# Patient Record
Sex: Male | Born: 1935 | Race: Black or African American | Hispanic: No | Smoking: Never smoker
Health system: Southern US, Community
[De-identification: ages and names within clinical notes are randomized; demographics above are authoritative.]

## PROBLEM LIST (undated history)

## (undated) DIAGNOSIS — M159 Polyosteoarthritis, unspecified: Secondary | ICD-10-CM

## (undated) DIAGNOSIS — N189 Chronic kidney disease, unspecified: Secondary | ICD-10-CM

## (undated) DIAGNOSIS — F0390 Unspecified dementia without behavioral disturbance: Secondary | ICD-10-CM

## (undated) DIAGNOSIS — S22059D Unspecified fracture of T5-T6 vertebra, subsequent encounter for fracture with routine healing: Secondary | ICD-10-CM

## (undated) DIAGNOSIS — E785 Hyperlipidemia, unspecified: Secondary | ICD-10-CM

## (undated) DIAGNOSIS — M6281 Muscle weakness (generalized): Secondary | ICD-10-CM

## (undated) DIAGNOSIS — M545 Low back pain, unspecified: Secondary | ICD-10-CM

## (undated) DIAGNOSIS — E119 Type 2 diabetes mellitus without complications: Secondary | ICD-10-CM

## (undated) DIAGNOSIS — N289 Disorder of kidney and ureter, unspecified: Secondary | ICD-10-CM

## (undated) DIAGNOSIS — I509 Heart failure, unspecified: Secondary | ICD-10-CM

## (undated) DIAGNOSIS — I10 Essential (primary) hypertension: Secondary | ICD-10-CM

## (undated) DIAGNOSIS — M1990 Unspecified osteoarthritis, unspecified site: Secondary | ICD-10-CM

## (undated) DIAGNOSIS — S22058D Other fracture of T5-T6 vertebra, subsequent encounter for fracture with routine healing: Secondary | ICD-10-CM

## (undated) HISTORY — PX: CHOLECYSTECTOMY: SHX55

## (undated) HISTORY — PX: BACK SURGERY: SHX140

## (undated) HISTORY — PX: HERNIA REPAIR: SHX51

## (undated) HISTORY — DX: Heart failure, unspecified: I50.9

## (undated) HISTORY — PX: KNEE SURGERY: SHX244

---

## 1998-07-10 ENCOUNTER — Ambulatory Visit (HOSPITAL_COMMUNITY): Admission: RE | Admit: 1998-07-10 | Discharge: 1998-07-10 | Payer: Self-pay | Admitting: Cardiology

## 1998-07-11 ENCOUNTER — Encounter: Payer: Self-pay | Admitting: Cardiology

## 1998-08-05 ENCOUNTER — Encounter: Payer: Self-pay | Admitting: Internal Medicine

## 1998-08-05 ENCOUNTER — Ambulatory Visit (HOSPITAL_COMMUNITY): Admission: RE | Admit: 1998-08-05 | Discharge: 1998-08-05 | Payer: Self-pay | Admitting: Internal Medicine

## 1998-11-17 ENCOUNTER — Encounter: Admission: RE | Admit: 1998-11-17 | Discharge: 1998-12-10 | Payer: Self-pay | Admitting: Orthopedic Surgery

## 1999-02-04 ENCOUNTER — Ambulatory Visit (HOSPITAL_BASED_OUTPATIENT_CLINIC_OR_DEPARTMENT_OTHER): Admission: RE | Admit: 1999-02-04 | Discharge: 1999-02-04 | Payer: Self-pay | Admitting: Orthopedic Surgery

## 1999-06-12 ENCOUNTER — Ambulatory Visit (HOSPITAL_BASED_OUTPATIENT_CLINIC_OR_DEPARTMENT_OTHER): Admission: RE | Admit: 1999-06-12 | Discharge: 1999-06-12 | Payer: Self-pay | Admitting: Urology

## 1999-08-27 ENCOUNTER — Encounter: Admission: RE | Admit: 1999-08-27 | Discharge: 1999-08-27 | Payer: Self-pay | Admitting: Cardiology

## 1999-08-27 ENCOUNTER — Encounter: Payer: Self-pay | Admitting: Cardiology

## 2000-10-31 ENCOUNTER — Encounter: Payer: Self-pay | Admitting: Cardiology

## 2000-10-31 ENCOUNTER — Ambulatory Visit (HOSPITAL_COMMUNITY): Admission: RE | Admit: 2000-10-31 | Discharge: 2000-10-31 | Payer: Self-pay | Admitting: Cardiology

## 2001-09-06 ENCOUNTER — Encounter: Payer: Self-pay | Admitting: Emergency Medicine

## 2001-09-06 ENCOUNTER — Emergency Department (HOSPITAL_COMMUNITY): Admission: EM | Admit: 2001-09-06 | Discharge: 2001-09-06 | Payer: Self-pay | Admitting: Emergency Medicine

## 2002-04-20 ENCOUNTER — Encounter: Payer: Self-pay | Admitting: Orthopedic Surgery

## 2002-04-20 ENCOUNTER — Encounter: Admission: RE | Admit: 2002-04-20 | Discharge: 2002-04-20 | Payer: Self-pay | Admitting: Orthopedic Surgery

## 2002-04-23 ENCOUNTER — Ambulatory Visit (HOSPITAL_BASED_OUTPATIENT_CLINIC_OR_DEPARTMENT_OTHER): Admission: RE | Admit: 2002-04-23 | Discharge: 2002-04-23 | Payer: Self-pay | Admitting: Orthopedic Surgery

## 2002-08-22 ENCOUNTER — Encounter (INDEPENDENT_AMBULATORY_CARE_PROVIDER_SITE_OTHER): Payer: Self-pay | Admitting: Specialist

## 2002-08-22 ENCOUNTER — Ambulatory Visit (HOSPITAL_COMMUNITY): Admission: RE | Admit: 2002-08-22 | Discharge: 2002-08-22 | Payer: Self-pay | Admitting: Gastroenterology

## 2002-09-22 ENCOUNTER — Emergency Department (HOSPITAL_COMMUNITY): Admission: EM | Admit: 2002-09-22 | Discharge: 2002-09-22 | Payer: Self-pay | Admitting: Emergency Medicine

## 2002-09-22 ENCOUNTER — Encounter: Payer: Self-pay | Admitting: Emergency Medicine

## 2003-03-13 ENCOUNTER — Encounter: Payer: Self-pay | Admitting: Cardiology

## 2003-03-13 ENCOUNTER — Encounter: Admission: RE | Admit: 2003-03-13 | Discharge: 2003-03-13 | Payer: Self-pay | Admitting: Cardiology

## 2003-04-15 ENCOUNTER — Encounter: Payer: Self-pay | Admitting: Cardiology

## 2003-04-15 ENCOUNTER — Ambulatory Visit (HOSPITAL_COMMUNITY): Admission: RE | Admit: 2003-04-15 | Discharge: 2003-04-15 | Payer: Self-pay | Admitting: Cardiology

## 2003-08-05 ENCOUNTER — Encounter (HOSPITAL_COMMUNITY): Admission: RE | Admit: 2003-08-05 | Discharge: 2003-11-03 | Payer: Self-pay | Admitting: Cardiology

## 2004-01-01 ENCOUNTER — Encounter: Admission: RE | Admit: 2004-01-01 | Discharge: 2004-01-01 | Payer: Self-pay | Admitting: Cardiology

## 2004-01-06 ENCOUNTER — Ambulatory Visit (HOSPITAL_BASED_OUTPATIENT_CLINIC_OR_DEPARTMENT_OTHER): Admission: RE | Admit: 2004-01-06 | Discharge: 2004-01-06 | Payer: Self-pay | Admitting: Cardiology

## 2004-01-11 ENCOUNTER — Ambulatory Visit (HOSPITAL_COMMUNITY): Admission: RE | Admit: 2004-01-11 | Discharge: 2004-01-11 | Payer: Self-pay

## 2004-01-28 ENCOUNTER — Emergency Department (HOSPITAL_COMMUNITY): Admission: EM | Admit: 2004-01-28 | Discharge: 2004-01-29 | Payer: Self-pay | Admitting: *Deleted

## 2004-02-09 ENCOUNTER — Ambulatory Visit (HOSPITAL_BASED_OUTPATIENT_CLINIC_OR_DEPARTMENT_OTHER): Admission: RE | Admit: 2004-02-09 | Discharge: 2004-02-09 | Payer: Self-pay | Admitting: Cardiology

## 2004-04-13 ENCOUNTER — Encounter: Admission: RE | Admit: 2004-04-13 | Discharge: 2004-06-12 | Payer: Self-pay | Admitting: Neurosurgery

## 2004-11-24 ENCOUNTER — Encounter: Admission: RE | Admit: 2004-11-24 | Discharge: 2004-11-24 | Payer: Self-pay | Admitting: Cardiology

## 2004-11-30 ENCOUNTER — Encounter: Admission: RE | Admit: 2004-11-30 | Discharge: 2004-11-30 | Payer: Self-pay | Admitting: Cardiology

## 2004-12-04 ENCOUNTER — Encounter: Admission: RE | Admit: 2004-12-04 | Discharge: 2004-12-04 | Payer: Self-pay | Admitting: Cardiology

## 2005-03-16 ENCOUNTER — Emergency Department (HOSPITAL_COMMUNITY): Admission: EM | Admit: 2005-03-16 | Discharge: 2005-03-16 | Payer: Self-pay | Admitting: Emergency Medicine

## 2005-03-19 ENCOUNTER — Encounter: Admission: RE | Admit: 2005-03-19 | Discharge: 2005-03-19 | Payer: Self-pay | Admitting: Cardiology

## 2006-03-04 ENCOUNTER — Encounter: Admission: RE | Admit: 2006-03-04 | Discharge: 2006-03-04 | Payer: Self-pay | Admitting: Cardiology

## 2007-07-07 ENCOUNTER — Encounter (HOSPITAL_COMMUNITY): Admission: RE | Admit: 2007-07-07 | Discharge: 2007-08-10 | Payer: Self-pay | Admitting: Cardiology

## 2007-08-10 ENCOUNTER — Emergency Department (HOSPITAL_COMMUNITY): Admission: EM | Admit: 2007-08-10 | Discharge: 2007-08-10 | Payer: Self-pay | Admitting: Emergency Medicine

## 2007-08-12 ENCOUNTER — Emergency Department (HOSPITAL_COMMUNITY): Admission: EM | Admit: 2007-08-12 | Discharge: 2007-08-12 | Payer: Self-pay | Admitting: Family Medicine

## 2008-04-16 ENCOUNTER — Encounter: Admission: RE | Admit: 2008-04-16 | Discharge: 2008-04-16 | Payer: Self-pay | Admitting: Cardiology

## 2009-03-03 ENCOUNTER — Encounter (HOSPITAL_COMMUNITY): Admission: RE | Admit: 2009-03-03 | Discharge: 2009-05-05 | Payer: Self-pay | Admitting: Cardiology

## 2009-03-04 ENCOUNTER — Ambulatory Visit (HOSPITAL_COMMUNITY): Admission: RE | Admit: 2009-03-04 | Discharge: 2009-03-04 | Payer: Self-pay | Admitting: Cardiology

## 2010-12-18 NOTE — Op Note (Signed)
Daniel [name], Daniel [name] NO.:  [id number]   MEDICAL RECORD NO.:  [id number]                   PATIENT TYPE:  AMB   LOCATION:  DSC                                  FACILITY:  [facility]   PHYSICIAN:  Feliberto Gottron. Turner Daniels, M.D.                DATE OF BIRTH:  10/03/1927   DATE OF PROCEDURE:  04/23/2002  DATE OF DISCHARGE:                                 OPERATIVE REPORT   PREOPERATIVE DIAGNOSES:  Right shoulder impingement syndrome, with  subacromial and subclavicular spurring, probable rotator cuff tear.   POSTOPERATIVE DIAGNOSES:  Right shoulder impingement syndrome, with  subacromial and subclavicular spurring, with a definite rotator cuff tear,  supraspinatus, full thickness.   OPERATION PERFORMED:  Right shoulder arthroscopic anterior inferior  acromioplasty, co planing of the distal clavicle.  Debridement of full  thickness rotator cuff tear and partial thickness tear of the biceps anchor  and the glenoid labrum.   SURGEON:  Feliberto Gottron. Turner Daniels, M.D.   ASSISTANTLaural Benes. Jannet Mantis.   ANESTHESIA:  Interscalene block with general endotracheal.   ESTIMATED BLOOD LOSS:  Minimal.   FLUIDS REPLACED:  1L of crystalloid.   DRAINS:  None.   TOURNIQUET TIME:  None.   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  Plain radiographs do  show subclavicular and subacromial sharp type 3 spurs.  He has good rotator  cuff power but in his age group there is at least a 40% chance he has a full  thickness cuff tear.  In any event, having failed conservative measures, he  was taken for arthroscopic evaluation and treatment of his right shoulder.   DESCRIPTION OF PROCEDURE:  The patient was identified by arm band and taken  to the operating  room at [facility] where appropriate  anesthetic monitors are attached and interscalene block anesthesia was  induced to the right upper extremity followed by general endotracheal  anesthesia.  He was then placed in the beach chair position.  The right  upper extremity was prepped and draped in the usual sterile fashion from the  wrist to the hemithorax.  With traction applied to the elbow, standard  portals were then made 1.5 cm anterior to the [facility] joint, lateral to the  junction of the posterior and middle thirds of the acromion and posterior to  the posterolateral corner of the acromion process.  The inflow was placed  anteriorly, the arthroscope laterally and a 4.2 great white sucker shaver  posteriorly, allowing subacromial bursectomy and outlining of the  subclavicular and subacromial spurs.  We also identified what was initially  thought to be partial thickness tearing of the rotator cuff  but after  removal of the bursa, there was a full thickness tear of the supraspinatus.  It came very close to the rim of the glenoid.  The biceps tendon was also  noted to have some fraying.  In any event, having cleaned the spurs, a 4.5  hooded Vortex spur was then used to remove the distal clavicle spur as well  as the subacromial spurs creating a type 1 subacromial arch.  We further  debrided the rotator cuff tear back to what appeared to be stable margins,  involved primarily the supraspinatus tendon with a suspension bridge type  defect in the supraspinatus from the biceps back towards the infraspinatus.  We also smoothed off the edges of the greater tuberosity to allow for better  motion in the subacromial arch.  At this time the arthroscope was  repositioned into the glenohumeral joint from the posterior portal and we  identified tearing of the labrum which was also debrided back to stable  margins as well as the biceps anchor.  At this point the shoulder was then  washed  out with normal saline solution.  The articular cartilage was noted  to be in good condition.  A dressing of Xeroform, 4 x 4 dressing sponges and  paper tape and a sling applied.  Patient awakened and taken to the recovery  room without difficulty.                                                 Feliberto Gottron. Turner Daniels, M.D.    Ovid Curd  D:  04/23/2002  T:  04/24/2002  Job:  [id number]

## 2010-12-18 NOTE — Procedures (Signed)
NAME:  Daniel [name], Daniel [name] NO.:  [id number]   MEDICAL RECORD NO.:  [id number]          PATIENT TYPE:  OUT   LOCATION:  SLEEP CENTER                 FACILITY:  [facility]   PHYSICIAN:  Clinton D. Maple Hudson, M.D. DATE OF BIRTH:  [birth year]   DATE OF ADMISSION:  02/09/2004  DATE OF DISCHARGE:  02/09/2004                              NOCTURNAL POLYSOMNOGRAM   REFERRING PHYSICIAN:  Dr. Kevin Fenton Spruill   INDICATION FOR STUDY/HISTORY:  Hypersomnia with obstructive sleep apnea.  CPAP titration ordered.  Baseline NPSG on January 06, 2004 demonstrated severe  obstructive apnea with a RDI of 92 obstructive events per hour desaturating  to 71%.  Home medications include metformin, lisinopril, glipizide, Actos,  Toprol, Norvasc, Micardis.  No sleep medication was reported for this study.  Epworth sleepiness score 21/24.  Neck size 22 inches, BMI 39.4, weight 267  pounds.   SLEEP ARCHITECTURE:  Sleep of 458 minutes were recorded with a sleep  efficiency of 90% of which 9% was stage 1, 41% stage 2.  Stages 3 and 4 were  absent which may be consistent with his age.  Fifty percent of the study was  REM indicating REM rebound after initiation of CPAP.  Sleep latency was 3.5  minutes, confirming hypersomnia before the CPAP titration.  REM latency was  76 minutes.   RESPIRATORY DATA:  CPAP was titrated to 17 CWP with RDI of 5 obstructive  events per hour at that setting.  A large full face Ultramirage mask was  utilized with heated humidity.   OXYGEN DATA:  Oxygen saturation was held 92-97% with good control of  snoring.   CARDIAC DATA:  Normal sinus rhythm ranging 60-68 beats per minute.   IMPRESSION/RECOMMENDATION:  Severe obstructive sleep apnea/hypopnea syndrome  with good control at CPAP 17 CWP.                                   ______________________________                                Rennis Chris. Maple Hudson, M.D.                                Diplomate, American Board of Sleep  Medicine    CDY/MEDQ  D:  02/16/2004 14:19:32  T:  02/16/2004 23:04:14  Job:  [id number]

## 2010-12-18 NOTE — Op Note (Signed)
   NAME:  Daniel [name], Daniel [name] NO.:  [id number]   MEDICAL RECORD NO.:  [id number]                   PATIENT TYPE:  AMB   LOCATION:  ENDO                                 FACILITY:  [facility]   PHYSICIAN:  Anselmo Rod, M.D.               DATE OF BIRTH:  [birth year]   DATE OF PROCEDURE:  08/22/2002  DATE OF DISCHARGE:                                 OPERATIVE REPORT   PROCEDURE PERFORMED:  Esophagogastroduodenoscopy.   ENDOSCOPIST:  Anselmo Rod, M.D.   INSTRUMENT USED:  Olympus video panendoscope.   INDICATIONS FOR PROCEDURE:  A 77 year old African-American male with a  history of anemia and rectal bleeding.  Rule out peptic ulcer disease,  esophagitis, gastritis, etc.   PREPROCEDURE PREPARATION:  Informed consent was procured from the patient.  The patient had fasted for eight hours prior to the procedure.   PREPROCEDURE PHYSICAL:  VITAL SIGNS:  The patient has stable vital signs.  NECK:  Supple.  CHEST:  Clear to auscultation, S1, S2.  ABDOMEN:  Soft with normal bowel sounds.   DESCRIPTION OF PROCEDURE:  The patient was placed in the left lateral  decubitus position and sedated with 40 mg of Demerol and 3 mg of Versed  intravenously.  Once the patient was adequately sedated and maintained on  low-flow oxygen and continuous cardiac monitoring, the Olympus video  panendoscope was advanced through the mouthpiece, over the tongue, into the  esophagus under direct vision. The entire esophagus appeared normal with no  evidence of ring, stricture, mass, esophagitis, or Barrett's mucosa.  The  scope was then advanced into the stomach.  A small hiatal hernia was seen on  high retroflexion.  There was moderate antral gastritis noted with no  evidence of masses or polyps.  The proximal small bowel appeared normal.   IMPRESSION:  1. Small hiatal hernia.  2. Mild-to-moderate antral gastritis, no ulcers or erosions seen.  3. Normal-appearing proximal small  bowel.    RECOMMENDATIONS:  1. Follow anti-reflux measures.  2. Avoid nonsteroidal's including aspirin until further recommendations.  3. Proceed with a colonoscopy at this time.                                                Anselmo Rod, M.D.    JNM/MEDQ  D:  08/22/2002  T:  08/22/2002  Job:  [id number]   cc:   Olene Craven, M.D.  [address]  [address]  [place]  [place] [address]  Fax: [phone]

## 2010-12-18 NOTE — Op Note (Signed)
NAME:  Daniel [name], Daniel [name] NO.:  [id number]   MEDICAL RECORD NO.:  [id number]                   PATIENT TYPE:  AMB   LOCATION:  ENDO                                 FACILITY:  [facility]   PHYSICIAN:  Anselmo Rod, M.D.               DATE OF BIRTH:  11-04-1927   DATE OF PROCEDURE:  08/22/2002  DATE OF DISCHARGE:                                 OPERATIVE REPORT   PROCEDURE PERFORMED:  Colonoscopy with snare polypectomy (cold snare) times  two.   ENDOSCOPIST:  Charna Elizabeth, M.D.   INSTRUMENT USED:  Olympus video colonoscope.   INDICATIONS FOR PROCEDURE:  Rectal bleeding and anemia in a 77 year old  African-American male.  Rule out colonic polyps, masses, etc.   PREPROCEDURE PREPARATION:  Informed consent was procured from the patient.  The patient was fasted for eight hours prior to the procedure and prepped  with a bottle of magnesium citrate and a gallon of NuLytely the night prior  to the procedure.   PREPROCEDURE PHYSICAL:  The patient had stable vital signs.  Neck supple.  Chest clear to auscultation.  Abdomen soft with normal bowel sounds.   DESCRIPTION OF PROCEDURE:  The patient was placed in left lateral decubitus  position.  The patient received some sedation for the  esophagogastroduodenoscopy.  Therefore no additional sedation was given for  the colonoscopy.  Once the patient was adequately sedated and maintained on  low flow oxygen and continuous cardiac monitoring, the Olympus video  colonoscope was advanced from the rectum to the cecum with difficulty.  There was a large amount of residual stool in the right colon.  The  patient's position was changed from the left lateral to the supine position  to move the stool.  The appendiceal orifice and the ileocecal valve were  visualized and photographed.  Small lesions could have been missed.  A small  sessile polyp was snared from 20 cm, another small sessile polyp snared from  5 cm.  These  were removed by cold snare.  Moderate sized bleeding internal  hemorrhoids were noticed.  There were small external hemorrhoids noted on  anal inspection.  There was evidence of early left-sided diverticulosis in  the left colon.  No other masses or polyps were seen.   IMPRESSION:  1. Moderate-sized external hemorrhoid not bleeding at time of examination.  2. Moderate-sized bleeding internal hemorrhoids.  3. Two small sessile polyps snared from 5 and 20 cm (see description above).  4. Early left-sided diverticulosis.  5. Large amount of residual stool in the colon.  Small lesions could have     been missed.   RECOMMENDATIONS:  1. Await pathology results.  2. Avoid nonsteroidals including aspirin for now.  3. Outpatient follow-up in the next two weeks for further recommendations.    IMPRESSION:   RECOMMENDATIONS:  Anselmo Rod, M.D.    JNM/MEDQ  D:  08/22/2002  T:  08/22/2002  Job:  [id number]   cc:   Olene Craven, M.D.  [address]  [address]  [place]  [place] [address]  Fax: [phone]

## 2011-06-30 ENCOUNTER — Emergency Department: Payer: Self-pay | Admitting: *Deleted

## 2011-07-15 DIAGNOSIS — E785 Hyperlipidemia, unspecified: Secondary | ICD-10-CM | POA: Diagnosis not present

## 2011-07-15 DIAGNOSIS — I10 Essential (primary) hypertension: Secondary | ICD-10-CM | POA: Diagnosis not present

## 2011-07-15 DIAGNOSIS — E1129 Type 2 diabetes mellitus with other diabetic kidney complication: Secondary | ICD-10-CM | POA: Diagnosis not present

## 2011-07-15 DIAGNOSIS — R262 Difficulty in walking, not elsewhere classified: Secondary | ICD-10-CM | POA: Diagnosis not present

## 2011-07-15 DIAGNOSIS — E1165 Type 2 diabetes mellitus with hyperglycemia: Secondary | ICD-10-CM | POA: Diagnosis not present

## 2011-07-20 DIAGNOSIS — R262 Difficulty in walking, not elsewhere classified: Secondary | ICD-10-CM | POA: Diagnosis not present

## 2011-07-20 DIAGNOSIS — E1129 Type 2 diabetes mellitus with other diabetic kidney complication: Secondary | ICD-10-CM | POA: Diagnosis not present

## 2011-07-20 DIAGNOSIS — E785 Hyperlipidemia, unspecified: Secondary | ICD-10-CM | POA: Diagnosis not present

## 2011-07-20 DIAGNOSIS — I10 Essential (primary) hypertension: Secondary | ICD-10-CM | POA: Diagnosis not present

## 2011-07-21 DIAGNOSIS — E785 Hyperlipidemia, unspecified: Secondary | ICD-10-CM | POA: Diagnosis not present

## 2011-07-21 DIAGNOSIS — R262 Difficulty in walking, not elsewhere classified: Secondary | ICD-10-CM | POA: Diagnosis not present

## 2011-07-21 DIAGNOSIS — I10 Essential (primary) hypertension: Secondary | ICD-10-CM | POA: Diagnosis not present

## 2011-07-21 DIAGNOSIS — E1129 Type 2 diabetes mellitus with other diabetic kidney complication: Secondary | ICD-10-CM | POA: Diagnosis not present

## 2011-07-22 DIAGNOSIS — E785 Hyperlipidemia, unspecified: Secondary | ICD-10-CM | POA: Diagnosis not present

## 2011-07-22 DIAGNOSIS — E1165 Type 2 diabetes mellitus with hyperglycemia: Secondary | ICD-10-CM | POA: Diagnosis not present

## 2011-07-22 DIAGNOSIS — R262 Difficulty in walking, not elsewhere classified: Secondary | ICD-10-CM | POA: Diagnosis not present

## 2011-07-22 DIAGNOSIS — I10 Essential (primary) hypertension: Secondary | ICD-10-CM | POA: Diagnosis not present

## 2011-07-25 DIAGNOSIS — E1129 Type 2 diabetes mellitus with other diabetic kidney complication: Secondary | ICD-10-CM | POA: Diagnosis not present

## 2011-07-25 DIAGNOSIS — R262 Difficulty in walking, not elsewhere classified: Secondary | ICD-10-CM | POA: Diagnosis not present

## 2011-07-25 DIAGNOSIS — I10 Essential (primary) hypertension: Secondary | ICD-10-CM | POA: Diagnosis not present

## 2011-07-25 DIAGNOSIS — E785 Hyperlipidemia, unspecified: Secondary | ICD-10-CM | POA: Diagnosis not present

## 2011-07-26 DIAGNOSIS — R262 Difficulty in walking, not elsewhere classified: Secondary | ICD-10-CM | POA: Diagnosis not present

## 2011-07-26 DIAGNOSIS — E785 Hyperlipidemia, unspecified: Secondary | ICD-10-CM | POA: Diagnosis not present

## 2011-07-26 DIAGNOSIS — I10 Essential (primary) hypertension: Secondary | ICD-10-CM | POA: Diagnosis not present

## 2011-07-26 DIAGNOSIS — E1165 Type 2 diabetes mellitus with hyperglycemia: Secondary | ICD-10-CM | POA: Diagnosis not present

## 2011-07-28 DIAGNOSIS — E1165 Type 2 diabetes mellitus with hyperglycemia: Secondary | ICD-10-CM | POA: Diagnosis not present

## 2011-07-28 DIAGNOSIS — E785 Hyperlipidemia, unspecified: Secondary | ICD-10-CM | POA: Diagnosis not present

## 2011-07-28 DIAGNOSIS — R262 Difficulty in walking, not elsewhere classified: Secondary | ICD-10-CM | POA: Diagnosis not present

## 2011-07-28 DIAGNOSIS — I10 Essential (primary) hypertension: Secondary | ICD-10-CM | POA: Diagnosis not present

## 2011-08-04 DIAGNOSIS — I10 Essential (primary) hypertension: Secondary | ICD-10-CM | POA: Diagnosis not present

## 2011-08-04 DIAGNOSIS — E1129 Type 2 diabetes mellitus with other diabetic kidney complication: Secondary | ICD-10-CM | POA: Diagnosis not present

## 2011-08-04 DIAGNOSIS — R262 Difficulty in walking, not elsewhere classified: Secondary | ICD-10-CM | POA: Diagnosis not present

## 2011-08-04 DIAGNOSIS — E785 Hyperlipidemia, unspecified: Secondary | ICD-10-CM | POA: Diagnosis not present

## 2011-08-06 DIAGNOSIS — Z79899 Other long term (current) drug therapy: Secondary | ICD-10-CM | POA: Diagnosis not present

## 2011-08-12 DIAGNOSIS — E1129 Type 2 diabetes mellitus with other diabetic kidney complication: Secondary | ICD-10-CM | POA: Diagnosis not present

## 2011-08-12 DIAGNOSIS — E785 Hyperlipidemia, unspecified: Secondary | ICD-10-CM | POA: Diagnosis not present

## 2011-08-12 DIAGNOSIS — I10 Essential (primary) hypertension: Secondary | ICD-10-CM | POA: Diagnosis not present

## 2011-08-12 DIAGNOSIS — R262 Difficulty in walking, not elsewhere classified: Secondary | ICD-10-CM | POA: Diagnosis not present

## 2011-08-19 DIAGNOSIS — I10 Essential (primary) hypertension: Secondary | ICD-10-CM | POA: Diagnosis not present

## 2011-08-19 DIAGNOSIS — E785 Hyperlipidemia, unspecified: Secondary | ICD-10-CM | POA: Diagnosis not present

## 2011-08-19 DIAGNOSIS — E1129 Type 2 diabetes mellitus with other diabetic kidney complication: Secondary | ICD-10-CM | POA: Diagnosis not present

## 2011-08-19 DIAGNOSIS — R262 Difficulty in walking, not elsewhere classified: Secondary | ICD-10-CM | POA: Diagnosis not present

## 2011-08-26 DIAGNOSIS — E119 Type 2 diabetes mellitus without complications: Secondary | ICD-10-CM | POA: Diagnosis not present

## 2011-08-26 DIAGNOSIS — H04129 Dry eye syndrome of unspecified lacrimal gland: Secondary | ICD-10-CM | POA: Diagnosis not present

## 2011-10-04 DIAGNOSIS — E1129 Type 2 diabetes mellitus with other diabetic kidney complication: Secondary | ICD-10-CM | POA: Diagnosis not present

## 2011-10-04 DIAGNOSIS — Z79899 Other long term (current) drug therapy: Secondary | ICD-10-CM | POA: Diagnosis not present

## 2011-10-04 DIAGNOSIS — E1165 Type 2 diabetes mellitus with hyperglycemia: Secondary | ICD-10-CM | POA: Diagnosis not present

## 2011-10-04 DIAGNOSIS — I120 Hypertensive chronic kidney disease with stage 5 chronic kidney disease or end stage renal disease: Secondary | ICD-10-CM | POA: Diagnosis not present

## 2011-10-04 DIAGNOSIS — N183 Chronic kidney disease, stage 3 unspecified: Secondary | ICD-10-CM | POA: Diagnosis not present

## 2011-10-04 DIAGNOSIS — E7800 Pure hypercholesterolemia, unspecified: Secondary | ICD-10-CM | POA: Diagnosis not present

## 2011-11-10 DIAGNOSIS — R21 Rash and other nonspecific skin eruption: Secondary | ICD-10-CM | POA: Diagnosis not present

## 2011-11-10 DIAGNOSIS — I10 Essential (primary) hypertension: Secondary | ICD-10-CM | POA: Diagnosis not present

## 2012-01-05 DIAGNOSIS — M25569 Pain in unspecified knee: Secondary | ICD-10-CM | POA: Diagnosis not present

## 2012-01-05 DIAGNOSIS — E7800 Pure hypercholesterolemia, unspecified: Secondary | ICD-10-CM | POA: Diagnosis not present

## 2012-01-05 DIAGNOSIS — N183 Chronic kidney disease, stage 3 unspecified: Secondary | ICD-10-CM | POA: Diagnosis not present

## 2012-01-05 DIAGNOSIS — E559 Vitamin D deficiency, unspecified: Secondary | ICD-10-CM | POA: Diagnosis not present

## 2012-01-05 DIAGNOSIS — I10 Essential (primary) hypertension: Secondary | ICD-10-CM | POA: Diagnosis not present

## 2012-01-05 DIAGNOSIS — Z79899 Other long term (current) drug therapy: Secondary | ICD-10-CM | POA: Diagnosis not present

## 2012-01-28 DIAGNOSIS — N189 Chronic kidney disease, unspecified: Secondary | ICD-10-CM | POA: Diagnosis not present

## 2012-01-28 DIAGNOSIS — I129 Hypertensive chronic kidney disease with stage 1 through stage 4 chronic kidney disease, or unspecified chronic kidney disease: Secondary | ICD-10-CM | POA: Diagnosis not present

## 2012-01-28 DIAGNOSIS — D638 Anemia in other chronic diseases classified elsewhere: Secondary | ICD-10-CM | POA: Diagnosis not present

## 2012-01-28 DIAGNOSIS — Z7982 Long term (current) use of aspirin: Secondary | ICD-10-CM | POA: Diagnosis not present

## 2012-01-28 DIAGNOSIS — E860 Dehydration: Secondary | ICD-10-CM | POA: Diagnosis not present

## 2012-01-28 DIAGNOSIS — Z794 Long term (current) use of insulin: Secondary | ICD-10-CM | POA: Diagnosis not present

## 2012-01-28 DIAGNOSIS — Z6833 Body mass index (BMI) 33.0-33.9, adult: Secondary | ICD-10-CM | POA: Diagnosis not present

## 2012-01-28 DIAGNOSIS — E785 Hyperlipidemia, unspecified: Secondary | ICD-10-CM | POA: Diagnosis not present

## 2012-01-28 DIAGNOSIS — R42 Dizziness and giddiness: Secondary | ICD-10-CM | POA: Diagnosis not present

## 2012-01-28 DIAGNOSIS — Z79899 Other long term (current) drug therapy: Secondary | ICD-10-CM | POA: Diagnosis not present

## 2012-01-28 DIAGNOSIS — R112 Nausea with vomiting, unspecified: Secondary | ICD-10-CM | POA: Diagnosis not present

## 2012-01-28 DIAGNOSIS — E119 Type 2 diabetes mellitus without complications: Secondary | ICD-10-CM | POA: Diagnosis not present

## 2012-01-28 DIAGNOSIS — I10 Essential (primary) hypertension: Secondary | ICD-10-CM | POA: Diagnosis not present

## 2012-01-28 DIAGNOSIS — H538 Other visual disturbances: Secondary | ICD-10-CM | POA: Diagnosis not present

## 2012-01-28 DIAGNOSIS — E669 Obesity, unspecified: Secondary | ICD-10-CM | POA: Diagnosis not present

## 2012-01-28 DIAGNOSIS — R262 Difficulty in walking, not elsewhere classified: Secondary | ICD-10-CM | POA: Diagnosis not present

## 2012-01-28 LAB — COMPREHENSIVE METABOLIC PANEL
Anion Gap: 7 (ref 7–16)
BUN: 33 mg/dL — ABNORMAL HIGH (ref 7–18)
Calcium, Total: 9 mg/dL (ref 8.5–10.1)
Chloride: 102 mmol/L (ref 98–107)
Co2: 27 mmol/L (ref 21–32)
EGFR (Non-African Amer.): 35 — ABNORMAL LOW
Osmolality: 290 (ref 275–301)
Potassium: 3.8 mmol/L (ref 3.5–5.1)
SGOT(AST): 21 U/L (ref 15–37)
Total Protein: 8.4 g/dL — ABNORMAL HIGH (ref 6.4–8.2)

## 2012-01-28 LAB — CBC
HCT: 37.2 % — ABNORMAL LOW (ref 40.0–52.0)
MCHC: 33.7 g/dL (ref 32.0–36.0)
Platelet: 188 10*3/uL (ref 150–440)
RBC: 4.85 10*6/uL (ref 4.40–5.90)
RDW: 15.3 % — ABNORMAL HIGH (ref 11.5–14.5)
WBC: 6.3 10*3/uL (ref 3.8–10.6)

## 2012-01-28 LAB — URINALYSIS, COMPLETE
Bilirubin,UR: NEGATIVE
Nitrite: NEGATIVE
Ph: 5 (ref 4.5–8.0)
RBC,UR: <1 /HPF (ref 0–5)
Squamous Epithelial: NONE SEEN
WBC UR: NONE SEEN /HPF (ref 0–5)

## 2012-01-29 ENCOUNTER — Observation Stay: Payer: Self-pay | Admitting: Internal Medicine

## 2012-01-29 DIAGNOSIS — I10 Essential (primary) hypertension: Secondary | ICD-10-CM | POA: Diagnosis not present

## 2012-01-29 DIAGNOSIS — G459 Transient cerebral ischemic attack, unspecified: Secondary | ICD-10-CM | POA: Diagnosis not present

## 2012-01-29 DIAGNOSIS — R42 Dizziness and giddiness: Secondary | ICD-10-CM | POA: Diagnosis not present

## 2012-01-29 DIAGNOSIS — R269 Unspecified abnormalities of gait and mobility: Secondary | ICD-10-CM | POA: Diagnosis not present

## 2012-01-29 LAB — LIPID PANEL
Cholesterol: 180 mg/dL (ref 0–200)
HDL Cholesterol: 27 mg/dL — ABNORMAL LOW (ref 40–60)
Ldl Cholesterol, Calc: 106 mg/dL — ABNORMAL HIGH (ref 0–100)
VLDL Cholesterol, Calc: 47 mg/dL — ABNORMAL HIGH (ref 5–40)

## 2012-01-29 LAB — TROPONIN I
Troponin-I: <0.02 ng/mL
Troponin-I: 0.03 ng/mL

## 2012-01-30 DIAGNOSIS — R269 Unspecified abnormalities of gait and mobility: Secondary | ICD-10-CM | POA: Diagnosis not present

## 2012-01-30 DIAGNOSIS — I10 Essential (primary) hypertension: Secondary | ICD-10-CM | POA: Diagnosis not present

## 2012-01-30 DIAGNOSIS — R42 Dizziness and giddiness: Secondary | ICD-10-CM | POA: Diagnosis not present

## 2012-01-30 LAB — CBC WITH DIFFERENTIAL/PLATELET
Basophil #: 0.1 10*3/uL (ref 0.0–0.1)
Basophil %: 0.9 %
Eosinophil %: 2.4 %
HCT: 35.7 % — ABNORMAL LOW (ref 40.0–52.0)
HGB: 11.9 g/dL — ABNORMAL LOW (ref 13.0–18.0)
Lymphocyte #: 2.3 10*3/uL (ref 1.0–3.6)
MCH: 25.4 pg — ABNORMAL LOW (ref 26.0–34.0)
MCHC: 33.2 g/dL (ref 32.0–36.0)
MCV: 77 fL — ABNORMAL LOW (ref 80–100)
Monocyte #: 0.9 x10 3/mm (ref 0.2–1.0)
Monocyte %: 14.7 %
Neutrophil %: 43.8 %
RBC: 4.67 10*6/uL (ref 4.40–5.90)
RDW: 15.2 % — ABNORMAL HIGH (ref 11.5–14.5)

## 2012-01-30 LAB — BASIC METABOLIC PANEL
Anion Gap: 6 — ABNORMAL LOW (ref 7–16)
Calcium, Total: 9 mg/dL (ref 8.5–10.1)
Chloride: 106 mmol/L (ref 98–107)
Co2: 28 mmol/L (ref 21–32)
Creatinine: 1.51 mg/dL — ABNORMAL HIGH (ref 0.60–1.30)
Glucose: 106 mg/dL — ABNORMAL HIGH (ref 65–99)
Osmolality: 284 (ref 275–301)

## 2012-01-30 LAB — PROTIME-INR: INR: 1

## 2012-01-31 DIAGNOSIS — IMO0001 Reserved for inherently not codable concepts without codable children: Secondary | ICD-10-CM | POA: Diagnosis not present

## 2012-01-31 DIAGNOSIS — R42 Dizziness and giddiness: Secondary | ICD-10-CM | POA: Diagnosis not present

## 2012-01-31 DIAGNOSIS — I10 Essential (primary) hypertension: Secondary | ICD-10-CM | POA: Diagnosis not present

## 2012-01-31 DIAGNOSIS — E119 Type 2 diabetes mellitus without complications: Secondary | ICD-10-CM | POA: Diagnosis not present

## 2012-02-02 DIAGNOSIS — I10 Essential (primary) hypertension: Secondary | ICD-10-CM | POA: Diagnosis not present

## 2012-02-02 DIAGNOSIS — R42 Dizziness and giddiness: Secondary | ICD-10-CM | POA: Diagnosis not present

## 2012-02-02 DIAGNOSIS — IMO0001 Reserved for inherently not codable concepts without codable children: Secondary | ICD-10-CM | POA: Diagnosis not present

## 2012-02-02 DIAGNOSIS — E119 Type 2 diabetes mellitus without complications: Secondary | ICD-10-CM | POA: Diagnosis not present

## 2012-02-04 DIAGNOSIS — E119 Type 2 diabetes mellitus without complications: Secondary | ICD-10-CM | POA: Diagnosis not present

## 2012-02-04 DIAGNOSIS — R42 Dizziness and giddiness: Secondary | ICD-10-CM | POA: Diagnosis not present

## 2012-02-04 DIAGNOSIS — I10 Essential (primary) hypertension: Secondary | ICD-10-CM | POA: Diagnosis not present

## 2012-02-04 DIAGNOSIS — IMO0001 Reserved for inherently not codable concepts without codable children: Secondary | ICD-10-CM | POA: Diagnosis not present

## 2012-02-07 DIAGNOSIS — E1165 Type 2 diabetes mellitus with hyperglycemia: Secondary | ICD-10-CM | POA: Diagnosis not present

## 2012-02-07 DIAGNOSIS — E1129 Type 2 diabetes mellitus with other diabetic kidney complication: Secondary | ICD-10-CM | POA: Diagnosis not present

## 2012-02-07 DIAGNOSIS — E559 Vitamin D deficiency, unspecified: Secondary | ICD-10-CM | POA: Diagnosis not present

## 2012-02-07 DIAGNOSIS — I129 Hypertensive chronic kidney disease with stage 1 through stage 4 chronic kidney disease, or unspecified chronic kidney disease: Secondary | ICD-10-CM | POA: Diagnosis not present

## 2012-02-07 DIAGNOSIS — Z79899 Other long term (current) drug therapy: Secondary | ICD-10-CM | POA: Diagnosis not present

## 2012-02-08 DIAGNOSIS — I10 Essential (primary) hypertension: Secondary | ICD-10-CM | POA: Diagnosis not present

## 2012-02-08 DIAGNOSIS — IMO0001 Reserved for inherently not codable concepts without codable children: Secondary | ICD-10-CM | POA: Diagnosis not present

## 2012-02-08 DIAGNOSIS — E119 Type 2 diabetes mellitus without complications: Secondary | ICD-10-CM | POA: Diagnosis not present

## 2012-02-08 DIAGNOSIS — R42 Dizziness and giddiness: Secondary | ICD-10-CM | POA: Diagnosis not present

## 2012-02-11 DIAGNOSIS — R42 Dizziness and giddiness: Secondary | ICD-10-CM | POA: Diagnosis not present

## 2012-02-11 DIAGNOSIS — IMO0001 Reserved for inherently not codable concepts without codable children: Secondary | ICD-10-CM | POA: Diagnosis not present

## 2012-02-11 DIAGNOSIS — E119 Type 2 diabetes mellitus without complications: Secondary | ICD-10-CM | POA: Diagnosis not present

## 2012-02-11 DIAGNOSIS — I10 Essential (primary) hypertension: Secondary | ICD-10-CM | POA: Diagnosis not present

## 2012-03-03 DIAGNOSIS — E1129 Type 2 diabetes mellitus with other diabetic kidney complication: Secondary | ICD-10-CM | POA: Diagnosis not present

## 2012-03-03 DIAGNOSIS — M159 Polyosteoarthritis, unspecified: Secondary | ICD-10-CM | POA: Diagnosis not present

## 2012-03-03 DIAGNOSIS — I129 Hypertensive chronic kidney disease with stage 1 through stage 4 chronic kidney disease, or unspecified chronic kidney disease: Secondary | ICD-10-CM | POA: Diagnosis not present

## 2012-04-06 DIAGNOSIS — E559 Vitamin D deficiency, unspecified: Secondary | ICD-10-CM | POA: Diagnosis not present

## 2012-04-06 DIAGNOSIS — M25549 Pain in joints of unspecified hand: Secondary | ICD-10-CM | POA: Diagnosis not present

## 2012-04-06 DIAGNOSIS — I120 Hypertensive chronic kidney disease with stage 5 chronic kidney disease or end stage renal disease: Secondary | ICD-10-CM | POA: Diagnosis not present

## 2012-04-06 DIAGNOSIS — IMO0001 Reserved for inherently not codable concepts without codable children: Secondary | ICD-10-CM | POA: Diagnosis not present

## 2012-04-06 DIAGNOSIS — E7800 Pure hypercholesterolemia, unspecified: Secondary | ICD-10-CM | POA: Diagnosis not present

## 2012-04-06 DIAGNOSIS — I129 Hypertensive chronic kidney disease with stage 1 through stage 4 chronic kidney disease, or unspecified chronic kidney disease: Secondary | ICD-10-CM | POA: Diagnosis not present

## 2012-04-06 DIAGNOSIS — Z0000 Encounter for general adult medical examination without abnormal findings: Secondary | ICD-10-CM | POA: Diagnosis not present

## 2012-04-06 DIAGNOSIS — M545 Low back pain: Secondary | ICD-10-CM | POA: Diagnosis not present

## 2012-04-11 DIAGNOSIS — E7800 Pure hypercholesterolemia, unspecified: Secondary | ICD-10-CM | POA: Diagnosis not present

## 2012-04-11 DIAGNOSIS — I10 Essential (primary) hypertension: Secondary | ICD-10-CM | POA: Diagnosis not present

## 2012-05-01 DIAGNOSIS — N401 Enlarged prostate with lower urinary tract symptoms: Secondary | ICD-10-CM | POA: Diagnosis not present

## 2012-06-08 DIAGNOSIS — N2581 Secondary hyperparathyroidism of renal origin: Secondary | ICD-10-CM | POA: Diagnosis not present

## 2012-06-26 ENCOUNTER — Other Ambulatory Visit: Payer: Self-pay | Admitting: Nephrology

## 2012-06-26 DIAGNOSIS — N183 Chronic kidney disease, stage 3 unspecified: Secondary | ICD-10-CM

## 2012-07-03 ENCOUNTER — Ambulatory Visit
Admission: RE | Admit: 2012-07-03 | Discharge: 2012-07-03 | Disposition: A | Discharge status: Home / Self Care | Payer: Medicare Other | Source: Ambulatory Visit | Attending: Nephrology | Admitting: Nephrology

## 2012-07-03 DIAGNOSIS — N189 Chronic kidney disease, unspecified: Secondary | ICD-10-CM | POA: Diagnosis not present

## 2012-07-06 DIAGNOSIS — E1129 Type 2 diabetes mellitus with other diabetic kidney complication: Secondary | ICD-10-CM | POA: Diagnosis not present

## 2012-07-06 DIAGNOSIS — E1165 Type 2 diabetes mellitus with hyperglycemia: Secondary | ICD-10-CM | POA: Diagnosis not present

## 2012-07-06 DIAGNOSIS — I120 Hypertensive chronic kidney disease with stage 5 chronic kidney disease or end stage renal disease: Secondary | ICD-10-CM | POA: Diagnosis not present

## 2012-07-06 DIAGNOSIS — E785 Hyperlipidemia, unspecified: Secondary | ICD-10-CM | POA: Diagnosis not present

## 2012-07-06 DIAGNOSIS — Z79899 Other long term (current) drug therapy: Secondary | ICD-10-CM | POA: Diagnosis not present

## 2012-07-24 DIAGNOSIS — S99929A Unspecified injury of unspecified foot, initial encounter: Secondary | ICD-10-CM | POA: Diagnosis not present

## 2012-07-24 DIAGNOSIS — I10 Essential (primary) hypertension: Secondary | ICD-10-CM | POA: Diagnosis not present

## 2012-07-24 DIAGNOSIS — M7989 Other specified soft tissue disorders: Secondary | ICD-10-CM | POA: Diagnosis not present

## 2012-07-24 DIAGNOSIS — M79609 Pain in unspecified limb: Secondary | ICD-10-CM | POA: Diagnosis not present

## 2012-07-24 DIAGNOSIS — S8990XA Unspecified injury of unspecified lower leg, initial encounter: Secondary | ICD-10-CM | POA: Diagnosis not present

## 2012-07-24 LAB — CBC
HGB: 10.8 g/dL — ABNORMAL LOW (ref 13.0–18.0)
MCH: 25.2 pg — ABNORMAL LOW (ref 26.0–34.0)
MCHC: 32.4 g/dL (ref 32.0–36.0)
MCV: 78 fL — ABNORMAL LOW (ref 80–100)
Platelet: 224 10*3/uL (ref 150–440)
RBC: 4.28 10*6/uL — ABNORMAL LOW (ref 4.40–5.90)
RDW: 14.5 % (ref 11.5–14.5)

## 2012-07-25 DIAGNOSIS — Z01810 Encounter for preprocedural cardiovascular examination: Secondary | ICD-10-CM | POA: Diagnosis not present

## 2012-07-25 DIAGNOSIS — S8990XA Unspecified injury of unspecified lower leg, initial encounter: Secondary | ICD-10-CM | POA: Diagnosis not present

## 2012-07-25 DIAGNOSIS — IMO0002 Reserved for concepts with insufficient information to code with codable children: Secondary | ICD-10-CM | POA: Diagnosis not present

## 2012-07-25 DIAGNOSIS — M66259 Spontaneous rupture of extensor tendons, unspecified thigh: Secondary | ICD-10-CM | POA: Diagnosis not present

## 2012-07-25 DIAGNOSIS — I10 Essential (primary) hypertension: Secondary | ICD-10-CM | POA: Diagnosis not present

## 2012-07-25 LAB — BASIC METABOLIC PANEL
Calcium, Total: 9.3 mg/dL (ref 8.5–10.1)
Chloride: 104 mmol/L (ref 98–107)
Co2: 27 mmol/L (ref 21–32)
Creatinine: 1.55 mg/dL — ABNORMAL HIGH (ref 0.60–1.30)
EGFR (Non-African Amer.): 41 — ABNORMAL LOW
Osmolality: 284 (ref 275–301)
Potassium: 4.1 mmol/L (ref 3.5–5.1)
Sodium: 138 mmol/L (ref 136–145)

## 2012-07-25 LAB — PROTIME-INR
INR: 0.9
Prothrombin Time: 12.8 secs (ref 11.5–14.7)

## 2012-07-26 ENCOUNTER — Inpatient Hospital Stay: Payer: Self-pay | Admitting: Specialist

## 2012-07-26 DIAGNOSIS — N179 Acute kidney failure, unspecified: Secondary | ICD-10-CM | POA: Diagnosis not present

## 2012-07-26 DIAGNOSIS — E669 Obesity, unspecified: Secondary | ICD-10-CM | POA: Diagnosis present

## 2012-07-26 DIAGNOSIS — K5900 Constipation, unspecified: Secondary | ICD-10-CM | POA: Diagnosis not present

## 2012-07-26 DIAGNOSIS — S86819A Strain of other muscle(s) and tendon(s) at lower leg level, unspecified leg, initial encounter: Secondary | ICD-10-CM | POA: Diagnosis not present

## 2012-07-26 DIAGNOSIS — I10 Essential (primary) hypertension: Secondary | ICD-10-CM | POA: Diagnosis not present

## 2012-07-26 DIAGNOSIS — N189 Chronic kidney disease, unspecified: Secondary | ICD-10-CM | POA: Diagnosis not present

## 2012-07-26 DIAGNOSIS — D649 Anemia, unspecified: Secondary | ICD-10-CM | POA: Diagnosis not present

## 2012-07-26 DIAGNOSIS — M66259 Spontaneous rupture of extensor tendons, unspecified thigh: Secondary | ICD-10-CM | POA: Diagnosis not present

## 2012-07-26 DIAGNOSIS — J9819 Other pulmonary collapse: Secondary | ICD-10-CM | POA: Diagnosis not present

## 2012-07-26 DIAGNOSIS — Z794 Long term (current) use of insulin: Secondary | ICD-10-CM | POA: Diagnosis not present

## 2012-07-26 DIAGNOSIS — I129 Hypertensive chronic kidney disease with stage 1 through stage 4 chronic kidney disease, or unspecified chronic kidney disease: Secondary | ICD-10-CM | POA: Diagnosis not present

## 2012-07-26 DIAGNOSIS — N289 Disorder of kidney and ureter, unspecified: Secondary | ICD-10-CM | POA: Diagnosis present

## 2012-07-26 DIAGNOSIS — E785 Hyperlipidemia, unspecified: Secondary | ICD-10-CM | POA: Diagnosis present

## 2012-07-26 DIAGNOSIS — E119 Type 2 diabetes mellitus without complications: Secondary | ICD-10-CM | POA: Diagnosis not present

## 2012-07-26 DIAGNOSIS — Z9181 History of falling: Secondary | ICD-10-CM | POA: Diagnosis not present

## 2012-07-26 DIAGNOSIS — N3289 Other specified disorders of bladder: Secondary | ICD-10-CM | POA: Diagnosis not present

## 2012-07-26 DIAGNOSIS — R262 Difficulty in walking, not elsewhere classified: Secondary | ICD-10-CM | POA: Diagnosis not present

## 2012-07-26 DIAGNOSIS — S99929A Unspecified injury of unspecified foot, initial encounter: Secondary | ICD-10-CM | POA: Diagnosis not present

## 2012-07-26 DIAGNOSIS — Z0389 Encounter for observation for other suspected diseases and conditions ruled out: Secondary | ICD-10-CM | POA: Diagnosis not present

## 2012-07-26 DIAGNOSIS — M6281 Muscle weakness (generalized): Secondary | ICD-10-CM | POA: Diagnosis not present

## 2012-07-26 DIAGNOSIS — E559 Vitamin D deficiency, unspecified: Secondary | ICD-10-CM | POA: Diagnosis present

## 2012-07-26 DIAGNOSIS — Z4789 Encounter for other orthopedic aftercare: Secondary | ICD-10-CM | POA: Diagnosis not present

## 2012-07-26 DIAGNOSIS — Z5189 Encounter for other specified aftercare: Secondary | ICD-10-CM | POA: Diagnosis not present

## 2012-07-26 DIAGNOSIS — D573 Sickle-cell trait: Secondary | ICD-10-CM | POA: Diagnosis present

## 2012-07-26 DIAGNOSIS — N183 Chronic kidney disease, stage 3 unspecified: Secondary | ICD-10-CM | POA: Diagnosis not present

## 2012-07-26 DIAGNOSIS — M25569 Pain in unspecified knee: Secondary | ICD-10-CM | POA: Diagnosis not present

## 2012-07-26 DIAGNOSIS — Z6841 Body Mass Index (BMI) 40.0 and over, adult: Secondary | ICD-10-CM | POA: Diagnosis not present

## 2012-07-26 DIAGNOSIS — Z7982 Long term (current) use of aspirin: Secondary | ICD-10-CM | POA: Diagnosis not present

## 2012-07-26 DIAGNOSIS — IMO0002 Reserved for concepts with insufficient information to code with codable children: Secondary | ICD-10-CM | POA: Diagnosis not present

## 2012-07-26 DIAGNOSIS — S838X9A Sprain of other specified parts of unspecified knee, initial encounter: Secondary | ICD-10-CM | POA: Diagnosis not present

## 2012-07-26 DIAGNOSIS — N2581 Secondary hyperparathyroidism of renal origin: Secondary | ICD-10-CM | POA: Diagnosis present

## 2012-07-27 DIAGNOSIS — S8990XA Unspecified injury of unspecified lower leg, initial encounter: Secondary | ICD-10-CM | POA: Diagnosis not present

## 2012-07-27 DIAGNOSIS — I10 Essential (primary) hypertension: Secondary | ICD-10-CM | POA: Diagnosis not present

## 2012-07-27 LAB — ELECTROLYTE PANEL
Anion Gap: 7 (ref 7–16)
Potassium: 4.1 mmol/L (ref 3.5–5.1)
Sodium: 138 mmol/L (ref 136–145)

## 2012-07-27 LAB — BASIC METABOLIC PANEL
Chloride: 103 mmol/L (ref 98–107)
Co2: 27 mmol/L (ref 21–32)
Creatinine: 1.61 mg/dL — ABNORMAL HIGH (ref 0.60–1.30)
Potassium: 3.7 mmol/L (ref 3.5–5.1)
Sodium: 139 mmol/L (ref 136–145)

## 2012-07-27 LAB — HEMATOCRIT: HCT: 28.9 % — ABNORMAL LOW (ref 40.0–52.0)

## 2012-07-28 ENCOUNTER — Encounter: Payer: Self-pay | Admitting: Internal Medicine

## 2012-07-28 DIAGNOSIS — S99929A Unspecified injury of unspecified foot, initial encounter: Secondary | ICD-10-CM | POA: Diagnosis not present

## 2012-07-28 DIAGNOSIS — I10 Essential (primary) hypertension: Secondary | ICD-10-CM | POA: Diagnosis not present

## 2012-07-28 LAB — BASIC METABOLIC PANEL
Anion Gap: 9 (ref 7–16)
Chloride: 100 mmol/L (ref 98–107)
Co2: 26 mmol/L (ref 21–32)
Creatinine: 1.57 mg/dL — ABNORMAL HIGH (ref 0.60–1.30)
EGFR (Non-African Amer.): 40 — ABNORMAL LOW
Glucose: 137 mg/dL — ABNORMAL HIGH (ref 65–99)
Potassium: 4.1 mmol/L (ref 3.5–5.1)
Sodium: 135 mmol/L — ABNORMAL LOW (ref 136–145)

## 2012-07-28 LAB — HEMATOCRIT: HCT: 28.5 % — ABNORMAL LOW (ref 40.0–52.0)

## 2012-07-30 LAB — CBC WITH DIFFERENTIAL/PLATELET
Basophil #: 0 10*3/uL (ref 0.0–0.1)
Basophil %: 0.4 %
Eosinophil #: 0.1 10*3/uL (ref 0.0–0.7)
HCT: 27.2 % — ABNORMAL LOW (ref 40.0–52.0)
HGB: 9.2 g/dL — ABNORMAL LOW (ref 13.0–18.0)
Lymphocyte #: 1.7 10*3/uL (ref 1.0–3.6)
Lymphocyte %: 16.2 %
MCH: 25.9 pg — ABNORMAL LOW (ref 26.0–34.0)
MCHC: 33.7 g/dL (ref 32.0–36.0)
MCV: 77 fL — ABNORMAL LOW (ref 80–100)
Monocyte #: 1.5 x10 3/mm — ABNORMAL HIGH (ref 0.2–1.0)
Neutrophil #: 7.1 10*3/uL — ABNORMAL HIGH (ref 1.4–6.5)
WBC: 10.5 10*3/uL (ref 3.8–10.6)

## 2012-07-30 LAB — BASIC METABOLIC PANEL
BUN: 26 mg/dL — ABNORMAL HIGH (ref 7–18)
Chloride: 102 mmol/L (ref 98–107)
Co2: 24 mmol/L (ref 21–32)
Creatinine: 1.74 mg/dL — ABNORMAL HIGH (ref 0.60–1.30)
EGFR (African American): 41 — ABNORMAL LOW
Potassium: 4.3 mmol/L (ref 3.5–5.1)
Sodium: 135 mmol/L — ABNORMAL LOW (ref 136–145)

## 2012-07-31 LAB — CBC WITH DIFFERENTIAL/PLATELET
Basophil #: 0.1 10*3/uL (ref 0.0–0.1)
Basophil %: 0.5 %
Eosinophil %: 2 %
HCT: 26.6 % — ABNORMAL LOW (ref 40.0–52.0)
HGB: 9.4 g/dL — ABNORMAL LOW (ref 13.0–18.0)
Lymphocyte #: 2.9 10*3/uL (ref 1.0–3.6)
Lymphocyte %: 28.6 %
MCV: 78 fL — ABNORMAL LOW (ref 80–100)
Monocyte %: 13 %
Neutrophil #: 5.7 10*3/uL (ref 1.4–6.5)
Neutrophil %: 55.9 %
RDW: 14.7 % — ABNORMAL HIGH (ref 11.5–14.5)
WBC: 10.2 10*3/uL (ref 3.8–10.6)

## 2012-07-31 LAB — BASIC METABOLIC PANEL
Anion Gap: 8 (ref 7–16)
BUN: 31 mg/dL — ABNORMAL HIGH (ref 7–18)
Calcium, Total: 8.9 mg/dL (ref 8.5–10.1)
Chloride: 103 mmol/L (ref 98–107)
Co2: 24 mmol/L (ref 21–32)
Creatinine: 1.72 mg/dL — ABNORMAL HIGH (ref 0.60–1.30)
EGFR (African American): 42 — ABNORMAL LOW
Glucose: 115 mg/dL — ABNORMAL HIGH (ref 65–99)
Potassium: 4 mmol/L (ref 3.5–5.1)

## 2012-07-31 LAB — IRON AND TIBC
Iron Bind.Cap.(Total): 158 ug/dL — ABNORMAL LOW (ref 250–450)
Iron Saturation: 12 %
Unbound Iron-Bind.Cap.: 139 ug/dL

## 2012-07-31 LAB — FERRITIN: Ferritin (ARMC): 108 ng/mL (ref 8–388)

## 2012-07-31 LAB — HEMOGLOBIN A1C: Hemoglobin A1C: 8 % — ABNORMAL HIGH (ref 4.2–6.3)

## 2012-08-01 DIAGNOSIS — I10 Essential (primary) hypertension: Secondary | ICD-10-CM | POA: Diagnosis not present

## 2012-08-01 DIAGNOSIS — Z4789 Encounter for other orthopedic aftercare: Secondary | ICD-10-CM | POA: Diagnosis not present

## 2012-08-01 DIAGNOSIS — Z9181 History of falling: Secondary | ICD-10-CM | POA: Diagnosis not present

## 2012-08-01 DIAGNOSIS — N189 Chronic kidney disease, unspecified: Secondary | ICD-10-CM | POA: Diagnosis not present

## 2012-08-01 DIAGNOSIS — M6281 Muscle weakness (generalized): Secondary | ICD-10-CM | POA: Diagnosis not present

## 2012-08-01 DIAGNOSIS — M109 Gout, unspecified: Secondary | ICD-10-CM | POA: Diagnosis not present

## 2012-08-01 DIAGNOSIS — N289 Disorder of kidney and ureter, unspecified: Secondary | ICD-10-CM | POA: Diagnosis not present

## 2012-08-01 DIAGNOSIS — S99929A Unspecified injury of unspecified foot, initial encounter: Secondary | ICD-10-CM | POA: Diagnosis not present

## 2012-08-01 DIAGNOSIS — M66259 Spontaneous rupture of extensor tendons, unspecified thigh: Secondary | ICD-10-CM | POA: Diagnosis not present

## 2012-08-01 DIAGNOSIS — S8990XA Unspecified injury of unspecified lower leg, initial encounter: Secondary | ICD-10-CM | POA: Diagnosis not present

## 2012-08-01 DIAGNOSIS — S838X9A Sprain of other specified parts of unspecified knee, initial encounter: Secondary | ICD-10-CM | POA: Diagnosis not present

## 2012-08-01 DIAGNOSIS — D649 Anemia, unspecified: Secondary | ICD-10-CM | POA: Diagnosis not present

## 2012-08-01 DIAGNOSIS — Z5189 Encounter for other specified aftercare: Secondary | ICD-10-CM | POA: Diagnosis not present

## 2012-08-01 DIAGNOSIS — E119 Type 2 diabetes mellitus without complications: Secondary | ICD-10-CM | POA: Diagnosis not present

## 2012-08-01 DIAGNOSIS — I129 Hypertensive chronic kidney disease with stage 1 through stage 4 chronic kidney disease, or unspecified chronic kidney disease: Secondary | ICD-10-CM | POA: Diagnosis not present

## 2012-08-01 DIAGNOSIS — M79609 Pain in unspecified limb: Secondary | ICD-10-CM | POA: Diagnosis not present

## 2012-08-01 DIAGNOSIS — N179 Acute kidney failure, unspecified: Secondary | ICD-10-CM | POA: Diagnosis not present

## 2012-08-01 DIAGNOSIS — R262 Difficulty in walking, not elsewhere classified: Secondary | ICD-10-CM | POA: Diagnosis not present

## 2012-08-01 LAB — PROTEIN URINE, QUAL: Protein: NEGATIVE

## 2012-08-01 LAB — BASIC METABOLIC PANEL
Anion Gap: 9 (ref 7–16)
Calcium, Total: 8.8 mg/dL (ref 8.5–10.1)
Co2: 24 mmol/L (ref 21–32)
EGFR (African American): 50 — ABNORMAL LOW
EGFR (Non-African Amer.): 43 — ABNORMAL LOW
Glucose: 137 mg/dL — ABNORMAL HIGH (ref 65–99)
Osmolality: 280 (ref 275–301)

## 2012-08-01 LAB — CREATININE, URINE, RANDOM: Creatinine, Urine Random: 91.3 mg/dL (ref 30.0–125.0)

## 2012-08-01 LAB — PROTEIN ELECTROPHORESIS(ARMC)

## 2012-08-02 ENCOUNTER — Encounter: Payer: Self-pay | Admitting: Internal Medicine

## 2012-08-03 DIAGNOSIS — I10 Essential (primary) hypertension: Secondary | ICD-10-CM | POA: Diagnosis not present

## 2012-08-03 DIAGNOSIS — M79609 Pain in unspecified limb: Secondary | ICD-10-CM | POA: Diagnosis not present

## 2012-08-03 DIAGNOSIS — N289 Disorder of kidney and ureter, unspecified: Secondary | ICD-10-CM | POA: Diagnosis not present

## 2012-08-15 DIAGNOSIS — M66259 Spontaneous rupture of extensor tendons, unspecified thigh: Secondary | ICD-10-CM | POA: Diagnosis not present

## 2012-08-18 DIAGNOSIS — M109 Gout, unspecified: Secondary | ICD-10-CM | POA: Diagnosis not present

## 2012-09-01 DIAGNOSIS — D573 Sickle-cell trait: Secondary | ICD-10-CM | POA: Diagnosis not present

## 2012-09-01 DIAGNOSIS — Z4789 Encounter for other orthopedic aftercare: Secondary | ICD-10-CM | POA: Diagnosis not present

## 2012-09-01 DIAGNOSIS — Z794 Long term (current) use of insulin: Secondary | ICD-10-CM | POA: Diagnosis not present

## 2012-09-01 DIAGNOSIS — I129 Hypertensive chronic kidney disease with stage 1 through stage 4 chronic kidney disease, or unspecified chronic kidney disease: Secondary | ICD-10-CM | POA: Diagnosis not present

## 2012-09-01 DIAGNOSIS — N189 Chronic kidney disease, unspecified: Secondary | ICD-10-CM | POA: Diagnosis not present

## 2012-09-01 DIAGNOSIS — E119 Type 2 diabetes mellitus without complications: Secondary | ICD-10-CM | POA: Diagnosis not present

## 2012-09-02 ENCOUNTER — Encounter: Payer: Self-pay | Admitting: Internal Medicine

## 2012-09-04 DIAGNOSIS — Z794 Long term (current) use of insulin: Secondary | ICD-10-CM | POA: Diagnosis not present

## 2012-09-04 DIAGNOSIS — E119 Type 2 diabetes mellitus without complications: Secondary | ICD-10-CM | POA: Diagnosis not present

## 2012-09-04 DIAGNOSIS — D573 Sickle-cell trait: Secondary | ICD-10-CM | POA: Diagnosis not present

## 2012-09-04 DIAGNOSIS — Z4789 Encounter for other orthopedic aftercare: Secondary | ICD-10-CM | POA: Diagnosis not present

## 2012-09-04 DIAGNOSIS — I129 Hypertensive chronic kidney disease with stage 1 through stage 4 chronic kidney disease, or unspecified chronic kidney disease: Secondary | ICD-10-CM | POA: Diagnosis not present

## 2012-09-04 DIAGNOSIS — N189 Chronic kidney disease, unspecified: Secondary | ICD-10-CM | POA: Diagnosis not present

## 2012-09-06 DIAGNOSIS — D573 Sickle-cell trait: Secondary | ICD-10-CM | POA: Diagnosis not present

## 2012-09-06 DIAGNOSIS — E119 Type 2 diabetes mellitus without complications: Secondary | ICD-10-CM | POA: Diagnosis not present

## 2012-09-06 DIAGNOSIS — I129 Hypertensive chronic kidney disease with stage 1 through stage 4 chronic kidney disease, or unspecified chronic kidney disease: Secondary | ICD-10-CM | POA: Diagnosis not present

## 2012-09-06 DIAGNOSIS — Z4789 Encounter for other orthopedic aftercare: Secondary | ICD-10-CM | POA: Diagnosis not present

## 2012-09-06 DIAGNOSIS — Z794 Long term (current) use of insulin: Secondary | ICD-10-CM | POA: Diagnosis not present

## 2012-09-06 DIAGNOSIS — N189 Chronic kidney disease, unspecified: Secondary | ICD-10-CM | POA: Diagnosis not present

## 2012-09-07 DIAGNOSIS — D573 Sickle-cell trait: Secondary | ICD-10-CM | POA: Diagnosis not present

## 2012-09-07 DIAGNOSIS — E119 Type 2 diabetes mellitus without complications: Secondary | ICD-10-CM | POA: Diagnosis not present

## 2012-09-07 DIAGNOSIS — Z4789 Encounter for other orthopedic aftercare: Secondary | ICD-10-CM | POA: Diagnosis not present

## 2012-09-07 DIAGNOSIS — I129 Hypertensive chronic kidney disease with stage 1 through stage 4 chronic kidney disease, or unspecified chronic kidney disease: Secondary | ICD-10-CM | POA: Diagnosis not present

## 2012-09-07 DIAGNOSIS — Z794 Long term (current) use of insulin: Secondary | ICD-10-CM | POA: Diagnosis not present

## 2012-09-07 DIAGNOSIS — N189 Chronic kidney disease, unspecified: Secondary | ICD-10-CM | POA: Diagnosis not present

## 2012-09-08 DIAGNOSIS — E119 Type 2 diabetes mellitus without complications: Secondary | ICD-10-CM | POA: Diagnosis not present

## 2012-09-08 DIAGNOSIS — Z4789 Encounter for other orthopedic aftercare: Secondary | ICD-10-CM | POA: Diagnosis not present

## 2012-09-08 DIAGNOSIS — N189 Chronic kidney disease, unspecified: Secondary | ICD-10-CM | POA: Diagnosis not present

## 2012-09-08 DIAGNOSIS — Z794 Long term (current) use of insulin: Secondary | ICD-10-CM | POA: Diagnosis not present

## 2012-09-08 DIAGNOSIS — D573 Sickle-cell trait: Secondary | ICD-10-CM | POA: Diagnosis not present

## 2012-09-08 DIAGNOSIS — I129 Hypertensive chronic kidney disease with stage 1 through stage 4 chronic kidney disease, or unspecified chronic kidney disease: Secondary | ICD-10-CM | POA: Diagnosis not present

## 2012-09-11 DIAGNOSIS — Z4789 Encounter for other orthopedic aftercare: Secondary | ICD-10-CM | POA: Diagnosis not present

## 2012-09-11 DIAGNOSIS — E119 Type 2 diabetes mellitus without complications: Secondary | ICD-10-CM | POA: Diagnosis not present

## 2012-09-11 DIAGNOSIS — N189 Chronic kidney disease, unspecified: Secondary | ICD-10-CM | POA: Diagnosis not present

## 2012-09-11 DIAGNOSIS — D573 Sickle-cell trait: Secondary | ICD-10-CM | POA: Diagnosis not present

## 2012-09-11 DIAGNOSIS — I129 Hypertensive chronic kidney disease with stage 1 through stage 4 chronic kidney disease, or unspecified chronic kidney disease: Secondary | ICD-10-CM | POA: Diagnosis not present

## 2012-09-11 DIAGNOSIS — Z794 Long term (current) use of insulin: Secondary | ICD-10-CM | POA: Diagnosis not present

## 2012-09-12 DIAGNOSIS — E119 Type 2 diabetes mellitus without complications: Secondary | ICD-10-CM | POA: Diagnosis not present

## 2012-09-12 DIAGNOSIS — N189 Chronic kidney disease, unspecified: Secondary | ICD-10-CM | POA: Diagnosis not present

## 2012-09-12 DIAGNOSIS — Z4789 Encounter for other orthopedic aftercare: Secondary | ICD-10-CM | POA: Diagnosis not present

## 2012-09-12 DIAGNOSIS — D573 Sickle-cell trait: Secondary | ICD-10-CM | POA: Diagnosis not present

## 2012-09-12 DIAGNOSIS — Z794 Long term (current) use of insulin: Secondary | ICD-10-CM | POA: Diagnosis not present

## 2012-09-12 DIAGNOSIS — I129 Hypertensive chronic kidney disease with stage 1 through stage 4 chronic kidney disease, or unspecified chronic kidney disease: Secondary | ICD-10-CM | POA: Diagnosis not present

## 2012-09-13 DIAGNOSIS — Z4789 Encounter for other orthopedic aftercare: Secondary | ICD-10-CM | POA: Diagnosis not present

## 2012-09-13 DIAGNOSIS — I129 Hypertensive chronic kidney disease with stage 1 through stage 4 chronic kidney disease, or unspecified chronic kidney disease: Secondary | ICD-10-CM | POA: Diagnosis not present

## 2012-09-13 DIAGNOSIS — N189 Chronic kidney disease, unspecified: Secondary | ICD-10-CM | POA: Diagnosis not present

## 2012-09-13 DIAGNOSIS — Z794 Long term (current) use of insulin: Secondary | ICD-10-CM | POA: Diagnosis not present

## 2012-09-13 DIAGNOSIS — E119 Type 2 diabetes mellitus without complications: Secondary | ICD-10-CM | POA: Diagnosis not present

## 2012-09-13 DIAGNOSIS — D573 Sickle-cell trait: Secondary | ICD-10-CM | POA: Diagnosis not present

## 2012-09-15 DIAGNOSIS — I129 Hypertensive chronic kidney disease with stage 1 through stage 4 chronic kidney disease, or unspecified chronic kidney disease: Secondary | ICD-10-CM | POA: Diagnosis not present

## 2012-09-15 DIAGNOSIS — D573 Sickle-cell trait: Secondary | ICD-10-CM | POA: Diagnosis not present

## 2012-09-15 DIAGNOSIS — N189 Chronic kidney disease, unspecified: Secondary | ICD-10-CM | POA: Diagnosis not present

## 2012-09-15 DIAGNOSIS — Z4789 Encounter for other orthopedic aftercare: Secondary | ICD-10-CM | POA: Diagnosis not present

## 2012-09-15 DIAGNOSIS — E119 Type 2 diabetes mellitus without complications: Secondary | ICD-10-CM | POA: Diagnosis not present

## 2012-09-15 DIAGNOSIS — Z794 Long term (current) use of insulin: Secondary | ICD-10-CM | POA: Diagnosis not present

## 2012-09-18 DIAGNOSIS — Z4789 Encounter for other orthopedic aftercare: Secondary | ICD-10-CM | POA: Diagnosis not present

## 2012-09-18 DIAGNOSIS — E119 Type 2 diabetes mellitus without complications: Secondary | ICD-10-CM | POA: Diagnosis not present

## 2012-09-18 DIAGNOSIS — I129 Hypertensive chronic kidney disease with stage 1 through stage 4 chronic kidney disease, or unspecified chronic kidney disease: Secondary | ICD-10-CM | POA: Diagnosis not present

## 2012-09-18 DIAGNOSIS — D573 Sickle-cell trait: Secondary | ICD-10-CM | POA: Diagnosis not present

## 2012-09-18 DIAGNOSIS — N189 Chronic kidney disease, unspecified: Secondary | ICD-10-CM | POA: Diagnosis not present

## 2012-09-18 DIAGNOSIS — Z794 Long term (current) use of insulin: Secondary | ICD-10-CM | POA: Diagnosis not present

## 2012-09-20 DIAGNOSIS — N189 Chronic kidney disease, unspecified: Secondary | ICD-10-CM | POA: Diagnosis not present

## 2012-09-20 DIAGNOSIS — I129 Hypertensive chronic kidney disease with stage 1 through stage 4 chronic kidney disease, or unspecified chronic kidney disease: Secondary | ICD-10-CM | POA: Diagnosis not present

## 2012-09-20 DIAGNOSIS — D573 Sickle-cell trait: Secondary | ICD-10-CM | POA: Diagnosis not present

## 2012-09-20 DIAGNOSIS — E119 Type 2 diabetes mellitus without complications: Secondary | ICD-10-CM | POA: Diagnosis not present

## 2012-09-20 DIAGNOSIS — Z4789 Encounter for other orthopedic aftercare: Secondary | ICD-10-CM | POA: Diagnosis not present

## 2012-09-20 DIAGNOSIS — Z794 Long term (current) use of insulin: Secondary | ICD-10-CM | POA: Diagnosis not present

## 2012-09-21 DIAGNOSIS — I129 Hypertensive chronic kidney disease with stage 1 through stage 4 chronic kidney disease, or unspecified chronic kidney disease: Secondary | ICD-10-CM | POA: Diagnosis not present

## 2012-09-21 DIAGNOSIS — Z4789 Encounter for other orthopedic aftercare: Secondary | ICD-10-CM | POA: Diagnosis not present

## 2012-09-21 DIAGNOSIS — Z794 Long term (current) use of insulin: Secondary | ICD-10-CM | POA: Diagnosis not present

## 2012-09-21 DIAGNOSIS — N189 Chronic kidney disease, unspecified: Secondary | ICD-10-CM | POA: Diagnosis not present

## 2012-09-21 DIAGNOSIS — D573 Sickle-cell trait: Secondary | ICD-10-CM | POA: Diagnosis not present

## 2012-09-21 DIAGNOSIS — E119 Type 2 diabetes mellitus without complications: Secondary | ICD-10-CM | POA: Diagnosis not present

## 2012-09-22 DIAGNOSIS — Z4789 Encounter for other orthopedic aftercare: Secondary | ICD-10-CM | POA: Diagnosis not present

## 2012-09-22 DIAGNOSIS — I129 Hypertensive chronic kidney disease with stage 1 through stage 4 chronic kidney disease, or unspecified chronic kidney disease: Secondary | ICD-10-CM | POA: Diagnosis not present

## 2012-09-22 DIAGNOSIS — E119 Type 2 diabetes mellitus without complications: Secondary | ICD-10-CM | POA: Diagnosis not present

## 2012-09-22 DIAGNOSIS — D573 Sickle-cell trait: Secondary | ICD-10-CM | POA: Diagnosis not present

## 2012-09-22 DIAGNOSIS — Z794 Long term (current) use of insulin: Secondary | ICD-10-CM | POA: Diagnosis not present

## 2012-09-22 DIAGNOSIS — N189 Chronic kidney disease, unspecified: Secondary | ICD-10-CM | POA: Diagnosis not present

## 2012-09-25 DIAGNOSIS — I129 Hypertensive chronic kidney disease with stage 1 through stage 4 chronic kidney disease, or unspecified chronic kidney disease: Secondary | ICD-10-CM | POA: Diagnosis not present

## 2012-09-25 DIAGNOSIS — Z4789 Encounter for other orthopedic aftercare: Secondary | ICD-10-CM | POA: Diagnosis not present

## 2012-09-25 DIAGNOSIS — Z794 Long term (current) use of insulin: Secondary | ICD-10-CM | POA: Diagnosis not present

## 2012-09-25 DIAGNOSIS — E119 Type 2 diabetes mellitus without complications: Secondary | ICD-10-CM | POA: Diagnosis not present

## 2012-09-25 DIAGNOSIS — N189 Chronic kidney disease, unspecified: Secondary | ICD-10-CM | POA: Diagnosis not present

## 2012-09-25 DIAGNOSIS — D573 Sickle-cell trait: Secondary | ICD-10-CM | POA: Diagnosis not present

## 2012-09-26 DIAGNOSIS — Z4789 Encounter for other orthopedic aftercare: Secondary | ICD-10-CM | POA: Diagnosis not present

## 2012-09-27 DIAGNOSIS — D573 Sickle-cell trait: Secondary | ICD-10-CM | POA: Diagnosis not present

## 2012-09-27 DIAGNOSIS — I129 Hypertensive chronic kidney disease with stage 1 through stage 4 chronic kidney disease, or unspecified chronic kidney disease: Secondary | ICD-10-CM | POA: Diagnosis not present

## 2012-09-27 DIAGNOSIS — Z794 Long term (current) use of insulin: Secondary | ICD-10-CM | POA: Diagnosis not present

## 2012-09-27 DIAGNOSIS — Z4789 Encounter for other orthopedic aftercare: Secondary | ICD-10-CM | POA: Diagnosis not present

## 2012-09-27 DIAGNOSIS — N189 Chronic kidney disease, unspecified: Secondary | ICD-10-CM | POA: Diagnosis not present

## 2012-09-27 DIAGNOSIS — E119 Type 2 diabetes mellitus without complications: Secondary | ICD-10-CM | POA: Diagnosis not present

## 2012-09-29 DIAGNOSIS — Z794 Long term (current) use of insulin: Secondary | ICD-10-CM | POA: Diagnosis not present

## 2012-09-29 DIAGNOSIS — E119 Type 2 diabetes mellitus without complications: Secondary | ICD-10-CM | POA: Diagnosis not present

## 2012-09-29 DIAGNOSIS — Z4789 Encounter for other orthopedic aftercare: Secondary | ICD-10-CM | POA: Diagnosis not present

## 2012-09-29 DIAGNOSIS — N189 Chronic kidney disease, unspecified: Secondary | ICD-10-CM | POA: Diagnosis not present

## 2012-09-29 DIAGNOSIS — D573 Sickle-cell trait: Secondary | ICD-10-CM | POA: Diagnosis not present

## 2012-09-29 DIAGNOSIS — I129 Hypertensive chronic kidney disease with stage 1 through stage 4 chronic kidney disease, or unspecified chronic kidney disease: Secondary | ICD-10-CM | POA: Diagnosis not present

## 2012-10-02 DIAGNOSIS — I129 Hypertensive chronic kidney disease with stage 1 through stage 4 chronic kidney disease, or unspecified chronic kidney disease: Secondary | ICD-10-CM | POA: Diagnosis not present

## 2012-10-02 DIAGNOSIS — Z4789 Encounter for other orthopedic aftercare: Secondary | ICD-10-CM | POA: Diagnosis not present

## 2012-10-04 DIAGNOSIS — N189 Chronic kidney disease, unspecified: Secondary | ICD-10-CM | POA: Diagnosis not present

## 2012-10-04 DIAGNOSIS — Z794 Long term (current) use of insulin: Secondary | ICD-10-CM | POA: Diagnosis not present

## 2012-10-04 DIAGNOSIS — Z4789 Encounter for other orthopedic aftercare: Secondary | ICD-10-CM | POA: Diagnosis not present

## 2012-10-04 DIAGNOSIS — D573 Sickle-cell trait: Secondary | ICD-10-CM | POA: Diagnosis not present

## 2012-10-04 DIAGNOSIS — I129 Hypertensive chronic kidney disease with stage 1 through stage 4 chronic kidney disease, or unspecified chronic kidney disease: Secondary | ICD-10-CM | POA: Diagnosis not present

## 2012-10-04 DIAGNOSIS — E119 Type 2 diabetes mellitus without complications: Secondary | ICD-10-CM | POA: Diagnosis not present

## 2012-10-05 DIAGNOSIS — E785 Hyperlipidemia, unspecified: Secondary | ICD-10-CM | POA: Diagnosis not present

## 2012-10-05 DIAGNOSIS — E1129 Type 2 diabetes mellitus with other diabetic kidney complication: Secondary | ICD-10-CM | POA: Diagnosis not present

## 2012-10-05 DIAGNOSIS — I120 Hypertensive chronic kidney disease with stage 5 chronic kidney disease or end stage renal disease: Secondary | ICD-10-CM | POA: Diagnosis not present

## 2012-10-05 DIAGNOSIS — E1165 Type 2 diabetes mellitus with hyperglycemia: Secondary | ICD-10-CM | POA: Diagnosis not present

## 2012-10-05 DIAGNOSIS — Z79899 Other long term (current) drug therapy: Secondary | ICD-10-CM | POA: Diagnosis not present

## 2012-10-05 DIAGNOSIS — M25569 Pain in unspecified knee: Secondary | ICD-10-CM | POA: Diagnosis not present

## 2012-10-05 DIAGNOSIS — I129 Hypertensive chronic kidney disease with stage 1 through stage 4 chronic kidney disease, or unspecified chronic kidney disease: Secondary | ICD-10-CM | POA: Diagnosis not present

## 2012-10-09 DIAGNOSIS — N189 Chronic kidney disease, unspecified: Secondary | ICD-10-CM | POA: Diagnosis not present

## 2012-10-09 DIAGNOSIS — I129 Hypertensive chronic kidney disease with stage 1 through stage 4 chronic kidney disease, or unspecified chronic kidney disease: Secondary | ICD-10-CM | POA: Diagnosis not present

## 2012-10-09 DIAGNOSIS — E119 Type 2 diabetes mellitus without complications: Secondary | ICD-10-CM | POA: Diagnosis not present

## 2012-10-09 DIAGNOSIS — Z794 Long term (current) use of insulin: Secondary | ICD-10-CM | POA: Diagnosis not present

## 2012-10-09 DIAGNOSIS — Z4789 Encounter for other orthopedic aftercare: Secondary | ICD-10-CM | POA: Diagnosis not present

## 2012-10-09 DIAGNOSIS — D573 Sickle-cell trait: Secondary | ICD-10-CM | POA: Diagnosis not present

## 2012-10-11 DIAGNOSIS — Z794 Long term (current) use of insulin: Secondary | ICD-10-CM | POA: Diagnosis not present

## 2012-10-11 DIAGNOSIS — E119 Type 2 diabetes mellitus without complications: Secondary | ICD-10-CM | POA: Diagnosis not present

## 2012-10-11 DIAGNOSIS — D573 Sickle-cell trait: Secondary | ICD-10-CM | POA: Diagnosis not present

## 2012-10-11 DIAGNOSIS — Z4789 Encounter for other orthopedic aftercare: Secondary | ICD-10-CM | POA: Diagnosis not present

## 2012-10-11 DIAGNOSIS — N189 Chronic kidney disease, unspecified: Secondary | ICD-10-CM | POA: Diagnosis not present

## 2012-10-11 DIAGNOSIS — I129 Hypertensive chronic kidney disease with stage 1 through stage 4 chronic kidney disease, or unspecified chronic kidney disease: Secondary | ICD-10-CM | POA: Diagnosis not present

## 2012-10-12 DIAGNOSIS — Z4789 Encounter for other orthopedic aftercare: Secondary | ICD-10-CM | POA: Diagnosis not present

## 2012-10-19 DIAGNOSIS — I129 Hypertensive chronic kidney disease with stage 1 through stage 4 chronic kidney disease, or unspecified chronic kidney disease: Secondary | ICD-10-CM | POA: Diagnosis not present

## 2012-10-19 DIAGNOSIS — Z794 Long term (current) use of insulin: Secondary | ICD-10-CM | POA: Diagnosis not present

## 2012-10-19 DIAGNOSIS — N189 Chronic kidney disease, unspecified: Secondary | ICD-10-CM | POA: Diagnosis not present

## 2012-10-19 DIAGNOSIS — E119 Type 2 diabetes mellitus without complications: Secondary | ICD-10-CM | POA: Diagnosis not present

## 2012-10-19 DIAGNOSIS — D573 Sickle-cell trait: Secondary | ICD-10-CM | POA: Diagnosis not present

## 2012-10-19 DIAGNOSIS — Z4789 Encounter for other orthopedic aftercare: Secondary | ICD-10-CM | POA: Diagnosis not present

## 2012-10-24 DIAGNOSIS — D573 Sickle-cell trait: Secondary | ICD-10-CM | POA: Diagnosis not present

## 2012-10-24 DIAGNOSIS — E119 Type 2 diabetes mellitus without complications: Secondary | ICD-10-CM | POA: Diagnosis not present

## 2012-10-24 DIAGNOSIS — Z4789 Encounter for other orthopedic aftercare: Secondary | ICD-10-CM | POA: Diagnosis not present

## 2012-10-24 DIAGNOSIS — N189 Chronic kidney disease, unspecified: Secondary | ICD-10-CM | POA: Diagnosis not present

## 2012-10-24 DIAGNOSIS — I129 Hypertensive chronic kidney disease with stage 1 through stage 4 chronic kidney disease, or unspecified chronic kidney disease: Secondary | ICD-10-CM | POA: Diagnosis not present

## 2012-10-24 DIAGNOSIS — Z794 Long term (current) use of insulin: Secondary | ICD-10-CM | POA: Diagnosis not present

## 2012-10-25 DIAGNOSIS — Z4789 Encounter for other orthopedic aftercare: Secondary | ICD-10-CM | POA: Diagnosis not present

## 2012-10-25 DIAGNOSIS — N189 Chronic kidney disease, unspecified: Secondary | ICD-10-CM | POA: Diagnosis not present

## 2012-10-25 DIAGNOSIS — Z794 Long term (current) use of insulin: Secondary | ICD-10-CM | POA: Diagnosis not present

## 2012-10-25 DIAGNOSIS — E119 Type 2 diabetes mellitus without complications: Secondary | ICD-10-CM | POA: Diagnosis not present

## 2012-10-25 DIAGNOSIS — I129 Hypertensive chronic kidney disease with stage 1 through stage 4 chronic kidney disease, or unspecified chronic kidney disease: Secondary | ICD-10-CM | POA: Diagnosis not present

## 2012-10-25 DIAGNOSIS — D573 Sickle-cell trait: Secondary | ICD-10-CM | POA: Diagnosis not present

## 2012-10-26 DIAGNOSIS — Z4789 Encounter for other orthopedic aftercare: Secondary | ICD-10-CM | POA: Diagnosis not present

## 2012-10-26 DIAGNOSIS — E119 Type 2 diabetes mellitus without complications: Secondary | ICD-10-CM | POA: Diagnosis not present

## 2012-10-26 DIAGNOSIS — I129 Hypertensive chronic kidney disease with stage 1 through stage 4 chronic kidney disease, or unspecified chronic kidney disease: Secondary | ICD-10-CM | POA: Diagnosis not present

## 2012-10-26 DIAGNOSIS — Z794 Long term (current) use of insulin: Secondary | ICD-10-CM | POA: Diagnosis not present

## 2012-10-26 DIAGNOSIS — D573 Sickle-cell trait: Secondary | ICD-10-CM | POA: Diagnosis not present

## 2012-10-26 DIAGNOSIS — N189 Chronic kidney disease, unspecified: Secondary | ICD-10-CM | POA: Diagnosis not present

## 2012-11-16 DIAGNOSIS — I120 Hypertensive chronic kidney disease with stage 5 chronic kidney disease or end stage renal disease: Secondary | ICD-10-CM | POA: Diagnosis not present

## 2012-11-16 DIAGNOSIS — E1129 Type 2 diabetes mellitus with other diabetic kidney complication: Secondary | ICD-10-CM | POA: Diagnosis not present

## 2012-11-16 DIAGNOSIS — M25569 Pain in unspecified knee: Secondary | ICD-10-CM | POA: Diagnosis not present

## 2012-11-16 DIAGNOSIS — E1165 Type 2 diabetes mellitus with hyperglycemia: Secondary | ICD-10-CM | POA: Diagnosis not present

## 2012-11-20 ENCOUNTER — Emergency Department: Payer: Self-pay | Admitting: Unknown Physician Specialty

## 2012-11-20 DIAGNOSIS — IMO0001 Reserved for inherently not codable concepts without codable children: Secondary | ICD-10-CM | POA: Diagnosis not present

## 2012-11-20 DIAGNOSIS — I10 Essential (primary) hypertension: Secondary | ICD-10-CM | POA: Diagnosis not present

## 2012-11-20 DIAGNOSIS — R4182 Altered mental status, unspecified: Secondary | ICD-10-CM | POA: Diagnosis not present

## 2012-11-20 DIAGNOSIS — R7309 Other abnormal glucose: Secondary | ICD-10-CM | POA: Diagnosis not present

## 2012-11-20 DIAGNOSIS — Z79899 Other long term (current) drug therapy: Secondary | ICD-10-CM | POA: Diagnosis not present

## 2012-11-20 DIAGNOSIS — R42 Dizziness and giddiness: Secondary | ICD-10-CM | POA: Diagnosis not present

## 2012-11-20 LAB — COMPREHENSIVE METABOLIC PANEL
Albumin: 3.5 g/dL (ref 3.4–5.0)
BUN: 15 mg/dL (ref 7–18)
Bilirubin,Total: 0.4 mg/dL (ref 0.2–1.0)
Calcium, Total: 8.9 mg/dL (ref 8.5–10.1)
Chloride: 103 mmol/L (ref 98–107)
Co2: 26 mmol/L (ref 21–32)
Creatinine: 1.23 mg/dL (ref 0.60–1.30)
EGFR (African American): >60
EGFR (Non-African Amer.): 54 — ABNORMAL LOW
Osmolality: 281 (ref 275–301)
Potassium: 3.7 mmol/L (ref 3.5–5.1)
SGOT(AST): 22 U/L (ref 15–37)
SGPT (ALT): 17 U/L (ref 12–78)
Sodium: 135 mmol/L — ABNORMAL LOW (ref 136–145)
Total Protein: 8.3 g/dL — ABNORMAL HIGH (ref 6.4–8.2)

## 2012-11-20 LAB — URINALYSIS, COMPLETE
Bacteria: NONE SEEN
Ketone: NEGATIVE
Leukocyte Esterase: NEGATIVE
Nitrite: NEGATIVE
Protein: NEGATIVE
RBC,UR: 2 /HPF (ref 0–5)
Specific Gravity: 1.009 (ref 1.003–1.030)
WBC UR: <1 /HPF (ref 0–5)

## 2012-11-20 LAB — CBC
HGB: 12 g/dL — ABNORMAL LOW (ref 13.0–18.0)
MCH: 24.6 pg — ABNORMAL LOW (ref 26.0–34.0)
MCHC: 32.9 g/dL (ref 32.0–36.0)
Platelet: 183 10*3/uL (ref 150–440)
RBC: 4.87 10*6/uL (ref 4.40–5.90)
RDW: 16.1 % — ABNORMAL HIGH (ref 11.5–14.5)
WBC: 6.1 10*3/uL (ref 3.8–10.6)

## 2012-11-20 LAB — TROPONIN I: Troponin-I: <0.02 ng/mL

## 2012-12-20 DIAGNOSIS — Z79899 Other long term (current) drug therapy: Secondary | ICD-10-CM | POA: Diagnosis not present

## 2012-12-20 DIAGNOSIS — M25569 Pain in unspecified knee: Secondary | ICD-10-CM | POA: Diagnosis not present

## 2012-12-30 DIAGNOSIS — Z9181 History of falling: Secondary | ICD-10-CM | POA: Diagnosis not present

## 2012-12-30 DIAGNOSIS — R262 Difficulty in walking, not elsewhere classified: Secondary | ICD-10-CM | POA: Diagnosis not present

## 2012-12-30 DIAGNOSIS — E119 Type 2 diabetes mellitus without complications: Secondary | ICD-10-CM | POA: Diagnosis not present

## 2012-12-30 DIAGNOSIS — Z794 Long term (current) use of insulin: Secondary | ICD-10-CM | POA: Diagnosis not present

## 2012-12-30 DIAGNOSIS — I10 Essential (primary) hypertension: Secondary | ICD-10-CM | POA: Diagnosis not present

## 2012-12-30 DIAGNOSIS — M6281 Muscle weakness (generalized): Secondary | ICD-10-CM | POA: Diagnosis not present

## 2012-12-30 DIAGNOSIS — M25569 Pain in unspecified knee: Secondary | ICD-10-CM | POA: Diagnosis not present

## 2013-01-03 DIAGNOSIS — E119 Type 2 diabetes mellitus without complications: Secondary | ICD-10-CM | POA: Diagnosis not present

## 2013-01-03 DIAGNOSIS — M6281 Muscle weakness (generalized): Secondary | ICD-10-CM | POA: Diagnosis not present

## 2013-01-03 DIAGNOSIS — R262 Difficulty in walking, not elsewhere classified: Secondary | ICD-10-CM | POA: Diagnosis not present

## 2013-01-03 DIAGNOSIS — M25569 Pain in unspecified knee: Secondary | ICD-10-CM | POA: Diagnosis not present

## 2013-01-03 DIAGNOSIS — I10 Essential (primary) hypertension: Secondary | ICD-10-CM | POA: Diagnosis not present

## 2013-01-03 DIAGNOSIS — Z9181 History of falling: Secondary | ICD-10-CM | POA: Diagnosis not present

## 2013-01-04 DIAGNOSIS — I10 Essential (primary) hypertension: Secondary | ICD-10-CM | POA: Diagnosis not present

## 2013-01-04 DIAGNOSIS — R262 Difficulty in walking, not elsewhere classified: Secondary | ICD-10-CM | POA: Diagnosis not present

## 2013-01-04 DIAGNOSIS — E119 Type 2 diabetes mellitus without complications: Secondary | ICD-10-CM | POA: Diagnosis not present

## 2013-01-04 DIAGNOSIS — M25569 Pain in unspecified knee: Secondary | ICD-10-CM | POA: Diagnosis not present

## 2013-01-04 DIAGNOSIS — Z9181 History of falling: Secondary | ICD-10-CM | POA: Diagnosis not present

## 2013-01-04 DIAGNOSIS — M6281 Muscle weakness (generalized): Secondary | ICD-10-CM | POA: Diagnosis not present

## 2013-01-05 DIAGNOSIS — E119 Type 2 diabetes mellitus without complications: Secondary | ICD-10-CM | POA: Diagnosis not present

## 2013-01-05 DIAGNOSIS — Z9181 History of falling: Secondary | ICD-10-CM | POA: Diagnosis not present

## 2013-01-05 DIAGNOSIS — M25569 Pain in unspecified knee: Secondary | ICD-10-CM | POA: Diagnosis not present

## 2013-01-05 DIAGNOSIS — I10 Essential (primary) hypertension: Secondary | ICD-10-CM | POA: Diagnosis not present

## 2013-01-05 DIAGNOSIS — M6281 Muscle weakness (generalized): Secondary | ICD-10-CM | POA: Diagnosis not present

## 2013-01-05 DIAGNOSIS — R262 Difficulty in walking, not elsewhere classified: Secondary | ICD-10-CM | POA: Diagnosis not present

## 2013-01-08 DIAGNOSIS — M25569 Pain in unspecified knee: Secondary | ICD-10-CM | POA: Diagnosis not present

## 2013-01-08 DIAGNOSIS — Z9181 History of falling: Secondary | ICD-10-CM | POA: Diagnosis not present

## 2013-01-08 DIAGNOSIS — M6281 Muscle weakness (generalized): Secondary | ICD-10-CM | POA: Diagnosis not present

## 2013-01-08 DIAGNOSIS — E119 Type 2 diabetes mellitus without complications: Secondary | ICD-10-CM | POA: Diagnosis not present

## 2013-01-08 DIAGNOSIS — I10 Essential (primary) hypertension: Secondary | ICD-10-CM | POA: Diagnosis not present

## 2013-01-08 DIAGNOSIS — R262 Difficulty in walking, not elsewhere classified: Secondary | ICD-10-CM | POA: Diagnosis not present

## 2013-01-10 DIAGNOSIS — M25569 Pain in unspecified knee: Secondary | ICD-10-CM | POA: Diagnosis not present

## 2013-01-10 DIAGNOSIS — I10 Essential (primary) hypertension: Secondary | ICD-10-CM | POA: Diagnosis not present

## 2013-01-10 DIAGNOSIS — E119 Type 2 diabetes mellitus without complications: Secondary | ICD-10-CM | POA: Diagnosis not present

## 2013-01-10 DIAGNOSIS — M6281 Muscle weakness (generalized): Secondary | ICD-10-CM | POA: Diagnosis not present

## 2013-01-10 DIAGNOSIS — R262 Difficulty in walking, not elsewhere classified: Secondary | ICD-10-CM | POA: Diagnosis not present

## 2013-01-10 DIAGNOSIS — Z9181 History of falling: Secondary | ICD-10-CM | POA: Diagnosis not present

## 2013-01-11 DIAGNOSIS — M25569 Pain in unspecified knee: Secondary | ICD-10-CM | POA: Diagnosis not present

## 2013-01-11 DIAGNOSIS — Z9181 History of falling: Secondary | ICD-10-CM | POA: Diagnosis not present

## 2013-01-11 DIAGNOSIS — I10 Essential (primary) hypertension: Secondary | ICD-10-CM | POA: Diagnosis not present

## 2013-01-11 DIAGNOSIS — R262 Difficulty in walking, not elsewhere classified: Secondary | ICD-10-CM | POA: Diagnosis not present

## 2013-01-11 DIAGNOSIS — M6281 Muscle weakness (generalized): Secondary | ICD-10-CM | POA: Diagnosis not present

## 2013-01-11 DIAGNOSIS — E119 Type 2 diabetes mellitus without complications: Secondary | ICD-10-CM | POA: Diagnosis not present

## 2013-01-12 DIAGNOSIS — I10 Essential (primary) hypertension: Secondary | ICD-10-CM | POA: Diagnosis not present

## 2013-01-12 DIAGNOSIS — M6281 Muscle weakness (generalized): Secondary | ICD-10-CM | POA: Diagnosis not present

## 2013-01-12 DIAGNOSIS — R262 Difficulty in walking, not elsewhere classified: Secondary | ICD-10-CM | POA: Diagnosis not present

## 2013-01-12 DIAGNOSIS — M25569 Pain in unspecified knee: Secondary | ICD-10-CM | POA: Diagnosis not present

## 2013-01-12 DIAGNOSIS — Z9181 History of falling: Secondary | ICD-10-CM | POA: Diagnosis not present

## 2013-01-12 DIAGNOSIS — E119 Type 2 diabetes mellitus without complications: Secondary | ICD-10-CM | POA: Diagnosis not present

## 2013-01-15 DIAGNOSIS — E119 Type 2 diabetes mellitus without complications: Secondary | ICD-10-CM | POA: Diagnosis not present

## 2013-01-15 DIAGNOSIS — R262 Difficulty in walking, not elsewhere classified: Secondary | ICD-10-CM | POA: Diagnosis not present

## 2013-01-15 DIAGNOSIS — Z9181 History of falling: Secondary | ICD-10-CM | POA: Diagnosis not present

## 2013-01-15 DIAGNOSIS — M25569 Pain in unspecified knee: Secondary | ICD-10-CM | POA: Diagnosis not present

## 2013-01-15 DIAGNOSIS — I10 Essential (primary) hypertension: Secondary | ICD-10-CM | POA: Diagnosis not present

## 2013-01-15 DIAGNOSIS — M6281 Muscle weakness (generalized): Secondary | ICD-10-CM | POA: Diagnosis not present

## 2013-01-16 DIAGNOSIS — R262 Difficulty in walking, not elsewhere classified: Secondary | ICD-10-CM | POA: Diagnosis not present

## 2013-01-16 DIAGNOSIS — I10 Essential (primary) hypertension: Secondary | ICD-10-CM | POA: Diagnosis not present

## 2013-01-16 DIAGNOSIS — M25569 Pain in unspecified knee: Secondary | ICD-10-CM | POA: Diagnosis not present

## 2013-01-16 DIAGNOSIS — Z9181 History of falling: Secondary | ICD-10-CM | POA: Diagnosis not present

## 2013-01-16 DIAGNOSIS — M6281 Muscle weakness (generalized): Secondary | ICD-10-CM | POA: Diagnosis not present

## 2013-01-16 DIAGNOSIS — E119 Type 2 diabetes mellitus without complications: Secondary | ICD-10-CM | POA: Diagnosis not present

## 2013-01-17 DIAGNOSIS — E119 Type 2 diabetes mellitus without complications: Secondary | ICD-10-CM | POA: Diagnosis not present

## 2013-01-17 DIAGNOSIS — Z9181 History of falling: Secondary | ICD-10-CM | POA: Diagnosis not present

## 2013-01-17 DIAGNOSIS — M6281 Muscle weakness (generalized): Secondary | ICD-10-CM | POA: Diagnosis not present

## 2013-01-17 DIAGNOSIS — M25569 Pain in unspecified knee: Secondary | ICD-10-CM | POA: Diagnosis not present

## 2013-01-17 DIAGNOSIS — I10 Essential (primary) hypertension: Secondary | ICD-10-CM | POA: Diagnosis not present

## 2013-01-17 DIAGNOSIS — R262 Difficulty in walking, not elsewhere classified: Secondary | ICD-10-CM | POA: Diagnosis not present

## 2013-01-18 DIAGNOSIS — I129 Hypertensive chronic kidney disease with stage 1 through stage 4 chronic kidney disease, or unspecified chronic kidney disease: Secondary | ICD-10-CM | POA: Diagnosis not present

## 2013-01-18 DIAGNOSIS — Z79899 Other long term (current) drug therapy: Secondary | ICD-10-CM | POA: Diagnosis not present

## 2013-01-18 DIAGNOSIS — Z23 Encounter for immunization: Secondary | ICD-10-CM | POA: Diagnosis not present

## 2013-01-18 DIAGNOSIS — M25569 Pain in unspecified knee: Secondary | ICD-10-CM | POA: Diagnosis not present

## 2013-01-22 DIAGNOSIS — R262 Difficulty in walking, not elsewhere classified: Secondary | ICD-10-CM | POA: Diagnosis not present

## 2013-01-22 DIAGNOSIS — I10 Essential (primary) hypertension: Secondary | ICD-10-CM | POA: Diagnosis not present

## 2013-01-22 DIAGNOSIS — M25569 Pain in unspecified knee: Secondary | ICD-10-CM | POA: Diagnosis not present

## 2013-01-22 DIAGNOSIS — M6281 Muscle weakness (generalized): Secondary | ICD-10-CM | POA: Diagnosis not present

## 2013-01-22 DIAGNOSIS — E119 Type 2 diabetes mellitus without complications: Secondary | ICD-10-CM | POA: Diagnosis not present

## 2013-01-22 DIAGNOSIS — Z9181 History of falling: Secondary | ICD-10-CM | POA: Diagnosis not present

## 2013-01-23 DIAGNOSIS — M25569 Pain in unspecified knee: Secondary | ICD-10-CM | POA: Diagnosis not present

## 2013-01-23 DIAGNOSIS — I10 Essential (primary) hypertension: Secondary | ICD-10-CM | POA: Diagnosis not present

## 2013-01-23 DIAGNOSIS — M6281 Muscle weakness (generalized): Secondary | ICD-10-CM | POA: Diagnosis not present

## 2013-01-23 DIAGNOSIS — R262 Difficulty in walking, not elsewhere classified: Secondary | ICD-10-CM | POA: Diagnosis not present

## 2013-01-23 DIAGNOSIS — Z9181 History of falling: Secondary | ICD-10-CM | POA: Diagnosis not present

## 2013-01-23 DIAGNOSIS — E119 Type 2 diabetes mellitus without complications: Secondary | ICD-10-CM | POA: Diagnosis not present

## 2013-01-26 DIAGNOSIS — I10 Essential (primary) hypertension: Secondary | ICD-10-CM | POA: Diagnosis not present

## 2013-01-26 DIAGNOSIS — M6281 Muscle weakness (generalized): Secondary | ICD-10-CM | POA: Diagnosis not present

## 2013-01-26 DIAGNOSIS — R262 Difficulty in walking, not elsewhere classified: Secondary | ICD-10-CM | POA: Diagnosis not present

## 2013-01-26 DIAGNOSIS — E119 Type 2 diabetes mellitus without complications: Secondary | ICD-10-CM | POA: Diagnosis not present

## 2013-01-26 DIAGNOSIS — Z9181 History of falling: Secondary | ICD-10-CM | POA: Diagnosis not present

## 2013-01-26 DIAGNOSIS — M25569 Pain in unspecified knee: Secondary | ICD-10-CM | POA: Diagnosis not present

## 2013-01-29 DIAGNOSIS — E119 Type 2 diabetes mellitus without complications: Secondary | ICD-10-CM | POA: Diagnosis not present

## 2013-01-29 DIAGNOSIS — M6281 Muscle weakness (generalized): Secondary | ICD-10-CM | POA: Diagnosis not present

## 2013-01-29 DIAGNOSIS — I10 Essential (primary) hypertension: Secondary | ICD-10-CM | POA: Diagnosis not present

## 2013-01-29 DIAGNOSIS — R262 Difficulty in walking, not elsewhere classified: Secondary | ICD-10-CM | POA: Diagnosis not present

## 2013-01-29 DIAGNOSIS — Z9181 History of falling: Secondary | ICD-10-CM | POA: Diagnosis not present

## 2013-01-29 DIAGNOSIS — M25569 Pain in unspecified knee: Secondary | ICD-10-CM | POA: Diagnosis not present

## 2013-01-30 DIAGNOSIS — I10 Essential (primary) hypertension: Secondary | ICD-10-CM | POA: Diagnosis not present

## 2013-01-30 DIAGNOSIS — M25569 Pain in unspecified knee: Secondary | ICD-10-CM | POA: Diagnosis not present

## 2013-01-30 DIAGNOSIS — E119 Type 2 diabetes mellitus without complications: Secondary | ICD-10-CM | POA: Diagnosis not present

## 2013-01-30 DIAGNOSIS — M6281 Muscle weakness (generalized): Secondary | ICD-10-CM | POA: Diagnosis not present

## 2013-01-30 DIAGNOSIS — Z9181 History of falling: Secondary | ICD-10-CM | POA: Diagnosis not present

## 2013-01-30 DIAGNOSIS — R262 Difficulty in walking, not elsewhere classified: Secondary | ICD-10-CM | POA: Diagnosis not present

## 2013-01-31 DIAGNOSIS — Z9181 History of falling: Secondary | ICD-10-CM | POA: Diagnosis not present

## 2013-01-31 DIAGNOSIS — R262 Difficulty in walking, not elsewhere classified: Secondary | ICD-10-CM | POA: Diagnosis not present

## 2013-01-31 DIAGNOSIS — M25569 Pain in unspecified knee: Secondary | ICD-10-CM | POA: Diagnosis not present

## 2013-01-31 DIAGNOSIS — E119 Type 2 diabetes mellitus without complications: Secondary | ICD-10-CM | POA: Diagnosis not present

## 2013-01-31 DIAGNOSIS — I10 Essential (primary) hypertension: Secondary | ICD-10-CM | POA: Diagnosis not present

## 2013-01-31 DIAGNOSIS — M6281 Muscle weakness (generalized): Secondary | ICD-10-CM | POA: Diagnosis not present

## 2013-02-06 DIAGNOSIS — M25569 Pain in unspecified knee: Secondary | ICD-10-CM | POA: Diagnosis not present

## 2013-02-06 DIAGNOSIS — E119 Type 2 diabetes mellitus without complications: Secondary | ICD-10-CM | POA: Diagnosis not present

## 2013-02-06 DIAGNOSIS — M6281 Muscle weakness (generalized): Secondary | ICD-10-CM | POA: Diagnosis not present

## 2013-02-06 DIAGNOSIS — I10 Essential (primary) hypertension: Secondary | ICD-10-CM | POA: Diagnosis not present

## 2013-02-06 DIAGNOSIS — R262 Difficulty in walking, not elsewhere classified: Secondary | ICD-10-CM | POA: Diagnosis not present

## 2013-02-06 DIAGNOSIS — Z9181 History of falling: Secondary | ICD-10-CM | POA: Diagnosis not present

## 2013-02-09 DIAGNOSIS — I10 Essential (primary) hypertension: Secondary | ICD-10-CM | POA: Diagnosis not present

## 2013-02-09 DIAGNOSIS — R262 Difficulty in walking, not elsewhere classified: Secondary | ICD-10-CM | POA: Diagnosis not present

## 2013-02-09 DIAGNOSIS — Z9181 History of falling: Secondary | ICD-10-CM | POA: Diagnosis not present

## 2013-02-09 DIAGNOSIS — E119 Type 2 diabetes mellitus without complications: Secondary | ICD-10-CM | POA: Diagnosis not present

## 2013-02-09 DIAGNOSIS — M25569 Pain in unspecified knee: Secondary | ICD-10-CM | POA: Diagnosis not present

## 2013-02-09 DIAGNOSIS — M6281 Muscle weakness (generalized): Secondary | ICD-10-CM | POA: Diagnosis not present

## 2013-02-13 DIAGNOSIS — R262 Difficulty in walking, not elsewhere classified: Secondary | ICD-10-CM | POA: Diagnosis not present

## 2013-02-13 DIAGNOSIS — I10 Essential (primary) hypertension: Secondary | ICD-10-CM | POA: Diagnosis not present

## 2013-02-13 DIAGNOSIS — K5900 Constipation, unspecified: Secondary | ICD-10-CM | POA: Diagnosis not present

## 2013-02-13 DIAGNOSIS — K439 Ventral hernia without obstruction or gangrene: Secondary | ICD-10-CM | POA: Diagnosis not present

## 2013-02-13 DIAGNOSIS — K5730 Diverticulosis of large intestine without perforation or abscess without bleeding: Secondary | ICD-10-CM | POA: Diagnosis not present

## 2013-02-13 DIAGNOSIS — D509 Iron deficiency anemia, unspecified: Secondary | ICD-10-CM | POA: Diagnosis not present

## 2013-02-13 DIAGNOSIS — E119 Type 2 diabetes mellitus without complications: Secondary | ICD-10-CM | POA: Diagnosis not present

## 2013-02-13 DIAGNOSIS — M6281 Muscle weakness (generalized): Secondary | ICD-10-CM | POA: Diagnosis not present

## 2013-02-13 DIAGNOSIS — K429 Umbilical hernia without obstruction or gangrene: Secondary | ICD-10-CM | POA: Diagnosis not present

## 2013-02-13 DIAGNOSIS — Z1211 Encounter for screening for malignant neoplasm of colon: Secondary | ICD-10-CM | POA: Diagnosis not present

## 2013-02-13 DIAGNOSIS — Z9181 History of falling: Secondary | ICD-10-CM | POA: Diagnosis not present

## 2013-02-13 DIAGNOSIS — M25569 Pain in unspecified knee: Secondary | ICD-10-CM | POA: Diagnosis not present

## 2013-02-13 DIAGNOSIS — Z86010 Personal history of colonic polyps: Secondary | ICD-10-CM | POA: Diagnosis not present

## 2013-02-14 DIAGNOSIS — I129 Hypertensive chronic kidney disease with stage 1 through stage 4 chronic kidney disease, or unspecified chronic kidney disease: Secondary | ICD-10-CM | POA: Diagnosis not present

## 2013-02-14 DIAGNOSIS — M25569 Pain in unspecified knee: Secondary | ICD-10-CM | POA: Diagnosis not present

## 2013-02-14 DIAGNOSIS — R5383 Other fatigue: Secondary | ICD-10-CM | POA: Diagnosis not present

## 2013-02-14 DIAGNOSIS — Z79899 Other long term (current) drug therapy: Secondary | ICD-10-CM | POA: Diagnosis not present

## 2013-02-14 DIAGNOSIS — E7800 Pure hypercholesterolemia, unspecified: Secondary | ICD-10-CM | POA: Diagnosis not present

## 2013-02-14 DIAGNOSIS — R899 Unspecified abnormal finding in specimens from other organs, systems and tissues: Secondary | ICD-10-CM | POA: Diagnosis not present

## 2013-02-14 DIAGNOSIS — R7989 Other specified abnormal findings of blood chemistry: Secondary | ICD-10-CM | POA: Diagnosis not present

## 2013-02-14 DIAGNOSIS — E1165 Type 2 diabetes mellitus with hyperglycemia: Secondary | ICD-10-CM | POA: Diagnosis not present

## 2013-02-14 DIAGNOSIS — E1129 Type 2 diabetes mellitus with other diabetic kidney complication: Secondary | ICD-10-CM | POA: Diagnosis not present

## 2013-02-15 DIAGNOSIS — R262 Difficulty in walking, not elsewhere classified: Secondary | ICD-10-CM | POA: Diagnosis not present

## 2013-02-15 DIAGNOSIS — Z9181 History of falling: Secondary | ICD-10-CM | POA: Diagnosis not present

## 2013-02-15 DIAGNOSIS — M25569 Pain in unspecified knee: Secondary | ICD-10-CM | POA: Diagnosis not present

## 2013-02-15 DIAGNOSIS — M6281 Muscle weakness (generalized): Secondary | ICD-10-CM | POA: Diagnosis not present

## 2013-02-15 DIAGNOSIS — E119 Type 2 diabetes mellitus without complications: Secondary | ICD-10-CM | POA: Diagnosis not present

## 2013-02-15 DIAGNOSIS — I10 Essential (primary) hypertension: Secondary | ICD-10-CM | POA: Diagnosis not present

## 2013-02-22 DIAGNOSIS — M6281 Muscle weakness (generalized): Secondary | ICD-10-CM | POA: Diagnosis not present

## 2013-02-22 DIAGNOSIS — M25569 Pain in unspecified knee: Secondary | ICD-10-CM | POA: Diagnosis not present

## 2013-02-22 DIAGNOSIS — R262 Difficulty in walking, not elsewhere classified: Secondary | ICD-10-CM | POA: Diagnosis not present

## 2013-02-22 DIAGNOSIS — Z9181 History of falling: Secondary | ICD-10-CM | POA: Diagnosis not present

## 2013-02-22 DIAGNOSIS — E119 Type 2 diabetes mellitus without complications: Secondary | ICD-10-CM | POA: Diagnosis not present

## 2013-02-22 DIAGNOSIS — I10 Essential (primary) hypertension: Secondary | ICD-10-CM | POA: Diagnosis not present

## 2013-03-23 DIAGNOSIS — E1129 Type 2 diabetes mellitus with other diabetic kidney complication: Secondary | ICD-10-CM | POA: Diagnosis not present

## 2013-03-23 DIAGNOSIS — E7800 Pure hypercholesterolemia, unspecified: Secondary | ICD-10-CM | POA: Diagnosis not present

## 2013-03-23 DIAGNOSIS — I129 Hypertensive chronic kidney disease with stage 1 through stage 4 chronic kidney disease, or unspecified chronic kidney disease: Secondary | ICD-10-CM | POA: Diagnosis not present

## 2013-04-10 DIAGNOSIS — B351 Tinea unguium: Secondary | ICD-10-CM | POA: Diagnosis not present

## 2013-04-10 DIAGNOSIS — E119 Type 2 diabetes mellitus without complications: Secondary | ICD-10-CM | POA: Diagnosis not present

## 2013-04-10 DIAGNOSIS — M79609 Pain in unspecified limb: Secondary | ICD-10-CM | POA: Diagnosis not present

## 2013-04-13 ENCOUNTER — Other Ambulatory Visit: Payer: Self-pay | Admitting: Gastroenterology

## 2013-05-24 DIAGNOSIS — N401 Enlarged prostate with lower urinary tract symptoms: Secondary | ICD-10-CM | POA: Diagnosis not present

## 2013-06-25 DIAGNOSIS — E1129 Type 2 diabetes mellitus with other diabetic kidney complication: Secondary | ICD-10-CM | POA: Diagnosis not present

## 2013-06-25 DIAGNOSIS — N058 Unspecified nephritic syndrome with other morphologic changes: Secondary | ICD-10-CM | POA: Diagnosis not present

## 2013-06-25 DIAGNOSIS — I129 Hypertensive chronic kidney disease with stage 1 through stage 4 chronic kidney disease, or unspecified chronic kidney disease: Secondary | ICD-10-CM | POA: Diagnosis not present

## 2013-06-25 DIAGNOSIS — Z006 Encounter for examination for normal comparison and control in clinical research program: Secondary | ICD-10-CM | POA: Diagnosis not present

## 2013-06-25 DIAGNOSIS — Z79899 Other long term (current) drug therapy: Secondary | ICD-10-CM | POA: Diagnosis not present

## 2013-07-13 ENCOUNTER — Other Ambulatory Visit: Payer: Self-pay | Admitting: Gastroenterology

## 2013-07-16 ENCOUNTER — Encounter (HOSPITAL_COMMUNITY): Admission: RE | Payer: Self-pay | Source: Ambulatory Visit

## 2013-07-16 ENCOUNTER — Ambulatory Visit (HOSPITAL_COMMUNITY): Admission: RE | Admit: 2013-07-16 | Payer: Medicare Other | Source: Ambulatory Visit | Admitting: Gastroenterology

## 2013-07-16 SURGERY — COLONOSCOPY
Anesthesia: Moderate Sedation

## 2013-08-08 DIAGNOSIS — C061 Malignant neoplasm of vestibule of mouth: Secondary | ICD-10-CM | POA: Diagnosis not present

## 2013-08-08 DIAGNOSIS — D165 Benign neoplasm of lower jaw bone: Secondary | ICD-10-CM | POA: Diagnosis not present

## 2013-09-06 DIAGNOSIS — I10 Essential (primary) hypertension: Secondary | ICD-10-CM | POA: Diagnosis not present

## 2013-09-06 DIAGNOSIS — D165 Benign neoplasm of lower jaw bone: Secondary | ICD-10-CM | POA: Diagnosis not present

## 2013-09-06 DIAGNOSIS — D480 Neoplasm of uncertain behavior of bone and articular cartilage: Secondary | ICD-10-CM | POA: Diagnosis not present

## 2013-09-06 DIAGNOSIS — E119 Type 2 diabetes mellitus without complications: Secondary | ICD-10-CM | POA: Diagnosis not present

## 2013-09-06 DIAGNOSIS — E049 Nontoxic goiter, unspecified: Secondary | ICD-10-CM | POA: Diagnosis not present

## 2013-09-06 DIAGNOSIS — Z885 Allergy status to narcotic agent status: Secondary | ICD-10-CM | POA: Diagnosis not present

## 2013-09-06 DIAGNOSIS — H9190 Unspecified hearing loss, unspecified ear: Secondary | ICD-10-CM | POA: Diagnosis not present

## 2013-09-26 DIAGNOSIS — Z125 Encounter for screening for malignant neoplasm of prostate: Secondary | ICD-10-CM | POA: Diagnosis not present

## 2013-09-26 DIAGNOSIS — M25569 Pain in unspecified knee: Secondary | ICD-10-CM | POA: Diagnosis not present

## 2013-09-26 DIAGNOSIS — E785 Hyperlipidemia, unspecified: Secondary | ICD-10-CM | POA: Diagnosis not present

## 2013-09-26 DIAGNOSIS — E0789 Other specified disorders of thyroid: Secondary | ICD-10-CM | POA: Diagnosis not present

## 2013-09-26 DIAGNOSIS — Z79899 Other long term (current) drug therapy: Secondary | ICD-10-CM | POA: Diagnosis not present

## 2013-09-26 DIAGNOSIS — E1129 Type 2 diabetes mellitus with other diabetic kidney complication: Secondary | ICD-10-CM | POA: Diagnosis not present

## 2013-09-26 DIAGNOSIS — I129 Hypertensive chronic kidney disease with stage 1 through stage 4 chronic kidney disease, or unspecified chronic kidney disease: Secondary | ICD-10-CM | POA: Diagnosis not present

## 2013-09-26 DIAGNOSIS — Z0000 Encounter for general adult medical examination without abnormal findings: Secondary | ICD-10-CM | POA: Diagnosis not present

## 2013-09-26 DIAGNOSIS — E559 Vitamin D deficiency, unspecified: Secondary | ICD-10-CM | POA: Diagnosis not present

## 2013-09-26 DIAGNOSIS — N183 Chronic kidney disease, stage 3 unspecified: Secondary | ICD-10-CM | POA: Diagnosis not present

## 2013-09-26 DIAGNOSIS — M159 Polyosteoarthritis, unspecified: Secondary | ICD-10-CM | POA: Diagnosis not present

## 2013-09-26 DIAGNOSIS — N182 Chronic kidney disease, stage 2 (mild): Secondary | ICD-10-CM | POA: Diagnosis not present

## 2013-10-01 DIAGNOSIS — D165 Benign neoplasm of lower jaw bone: Secondary | ICD-10-CM | POA: Diagnosis not present

## 2013-10-15 ENCOUNTER — Ambulatory Visit (HOSPITAL_COMMUNITY): Admit: 2013-10-15 | Payer: Self-pay | Admitting: Gastroenterology

## 2013-10-15 ENCOUNTER — Encounter (HOSPITAL_COMMUNITY): Payer: Self-pay

## 2013-10-15 DIAGNOSIS — I10 Essential (primary) hypertension: Secondary | ICD-10-CM | POA: Diagnosis not present

## 2013-10-15 DIAGNOSIS — Z01810 Encounter for preprocedural cardiovascular examination: Secondary | ICD-10-CM | POA: Diagnosis not present

## 2013-10-15 DIAGNOSIS — Z419 Encounter for procedure for purposes other than remedying health state, unspecified: Secondary | ICD-10-CM | POA: Diagnosis not present

## 2013-10-15 DIAGNOSIS — E049 Nontoxic goiter, unspecified: Secondary | ICD-10-CM | POA: Diagnosis not present

## 2013-10-15 DIAGNOSIS — Z01818 Encounter for other preprocedural examination: Secondary | ICD-10-CM | POA: Diagnosis not present

## 2013-10-15 DIAGNOSIS — D165 Benign neoplasm of lower jaw bone: Secondary | ICD-10-CM | POA: Diagnosis not present

## 2013-10-15 DIAGNOSIS — E119 Type 2 diabetes mellitus without complications: Secondary | ICD-10-CM | POA: Diagnosis not present

## 2013-10-15 DIAGNOSIS — H9190 Unspecified hearing loss, unspecified ear: Secondary | ICD-10-CM | POA: Diagnosis not present

## 2013-10-15 SURGERY — COLONOSCOPY
Anesthesia: Moderate Sedation

## 2013-10-29 DIAGNOSIS — Z8249 Family history of ischemic heart disease and other diseases of the circulatory system: Secondary | ICD-10-CM | POA: Diagnosis not present

## 2013-10-29 DIAGNOSIS — E049 Nontoxic goiter, unspecified: Secondary | ICD-10-CM | POA: Diagnosis not present

## 2013-10-29 DIAGNOSIS — I499 Cardiac arrhythmia, unspecified: Secondary | ICD-10-CM | POA: Diagnosis not present

## 2013-10-29 DIAGNOSIS — H9190 Unspecified hearing loss, unspecified ear: Secondary | ICD-10-CM | POA: Diagnosis present

## 2013-10-29 DIAGNOSIS — R220 Localized swelling, mass and lump, head: Secondary | ICD-10-CM | POA: Diagnosis not present

## 2013-10-29 DIAGNOSIS — D165 Benign neoplasm of lower jaw bone: Secondary | ICD-10-CM | POA: Diagnosis not present

## 2013-10-29 DIAGNOSIS — E872 Acidosis, unspecified: Secondary | ICD-10-CM | POA: Diagnosis not present

## 2013-10-29 DIAGNOSIS — Z452 Encounter for adjustment and management of vascular access device: Secondary | ICD-10-CM | POA: Diagnosis not present

## 2013-10-29 DIAGNOSIS — T8131XA Disruption of external operation (surgical) wound, not elsewhere classified, initial encounter: Secondary | ICD-10-CM | POA: Diagnosis not present

## 2013-10-29 DIAGNOSIS — J9600 Acute respiratory failure, unspecified whether with hypoxia or hypercapnia: Secondary | ICD-10-CM | POA: Diagnosis not present

## 2013-10-29 DIAGNOSIS — J95821 Acute postprocedural respiratory failure: Secondary | ICD-10-CM | POA: Diagnosis not present

## 2013-10-29 DIAGNOSIS — M6281 Muscle weakness (generalized): Secondary | ICD-10-CM | POA: Diagnosis not present

## 2013-10-29 DIAGNOSIS — Z9889 Other specified postprocedural states: Secondary | ICD-10-CM | POA: Diagnosis not present

## 2013-10-29 DIAGNOSIS — Z09 Encounter for follow-up examination after completed treatment for conditions other than malignant neoplasm: Secondary | ICD-10-CM | POA: Diagnosis not present

## 2013-10-29 DIAGNOSIS — R918 Other nonspecific abnormal finding of lung field: Secondary | ICD-10-CM | POA: Diagnosis not present

## 2013-10-29 DIAGNOSIS — D72829 Elevated white blood cell count, unspecified: Secondary | ICD-10-CM | POA: Diagnosis not present

## 2013-10-29 DIAGNOSIS — E119 Type 2 diabetes mellitus without complications: Secondary | ICD-10-CM | POA: Diagnosis present

## 2013-10-29 DIAGNOSIS — D62 Acute posthemorrhagic anemia: Secondary | ICD-10-CM | POA: Diagnosis not present

## 2013-10-29 DIAGNOSIS — R1310 Dysphagia, unspecified: Secondary | ICD-10-CM | POA: Diagnosis not present

## 2013-10-29 DIAGNOSIS — R633 Feeding difficulties, unspecified: Secondary | ICD-10-CM | POA: Diagnosis not present

## 2013-10-29 DIAGNOSIS — K5900 Constipation, unspecified: Secondary | ICD-10-CM | POA: Diagnosis not present

## 2013-10-29 DIAGNOSIS — G9341 Metabolic encephalopathy: Secondary | ICD-10-CM | POA: Diagnosis not present

## 2013-10-29 DIAGNOSIS — Z9911 Dependence on respirator [ventilator] status: Secondary | ICD-10-CM | POA: Diagnosis not present

## 2013-10-29 DIAGNOSIS — R109 Unspecified abdominal pain: Secondary | ICD-10-CM | POA: Diagnosis not present

## 2013-10-29 DIAGNOSIS — M6250 Muscle wasting and atrophy, not elsewhere classified, unspecified site: Secondary | ICD-10-CM | POA: Diagnosis not present

## 2013-10-29 DIAGNOSIS — Z4682 Encounter for fitting and adjustment of non-vascular catheter: Secondary | ICD-10-CM | POA: Diagnosis not present

## 2013-10-29 DIAGNOSIS — Z431 Encounter for attention to gastrostomy: Secondary | ICD-10-CM | POA: Diagnosis not present

## 2013-10-29 DIAGNOSIS — E870 Hyperosmolality and hypernatremia: Secondary | ICD-10-CM | POA: Diagnosis not present

## 2013-10-29 DIAGNOSIS — R5381 Other malaise: Secondary | ICD-10-CM | POA: Diagnosis not present

## 2013-10-29 DIAGNOSIS — I10 Essential (primary) hypertension: Secondary | ICD-10-CM | POA: Diagnosis not present

## 2013-10-29 DIAGNOSIS — R262 Difficulty in walking, not elsewhere classified: Secondary | ICD-10-CM | POA: Diagnosis not present

## 2013-10-29 DIAGNOSIS — T8132XA Disruption of internal operation (surgical) wound, not elsewhere classified, initial encounter: Secondary | ICD-10-CM | POA: Diagnosis not present

## 2013-10-29 DIAGNOSIS — I4581 Long QT syndrome: Secondary | ICD-10-CM | POA: Diagnosis not present

## 2013-10-29 DIAGNOSIS — C411 Malignant neoplasm of mandible: Secondary | ICD-10-CM | POA: Diagnosis present

## 2013-10-29 DIAGNOSIS — Z794 Long term (current) use of insulin: Secondary | ICD-10-CM | POA: Diagnosis not present

## 2013-10-29 DIAGNOSIS — E876 Hypokalemia: Secondary | ICD-10-CM | POA: Diagnosis not present

## 2013-10-29 DIAGNOSIS — Z7982 Long term (current) use of aspirin: Secondary | ICD-10-CM | POA: Diagnosis not present

## 2013-10-29 DIAGNOSIS — E860 Dehydration: Secondary | ICD-10-CM | POA: Diagnosis not present

## 2013-10-29 DIAGNOSIS — J9819 Other pulmonary collapse: Secondary | ICD-10-CM | POA: Diagnosis not present

## 2013-10-29 DIAGNOSIS — E215 Disorder of parathyroid gland, unspecified: Secondary | ICD-10-CM | POA: Diagnosis not present

## 2013-10-29 DIAGNOSIS — F05 Delirium due to known physiological condition: Secondary | ICD-10-CM | POA: Diagnosis not present

## 2013-10-29 DIAGNOSIS — IMO0001 Reserved for inherently not codable concepts without codable children: Secondary | ICD-10-CM | POA: Diagnosis not present

## 2013-11-15 IMAGING — CR DG CHEST 2V
1 series · 3 of 3 positions shown · non-contrast
Comparison: none

REASON FOR EXAM: rule out pneumonia
COMMENTS:

PROCEDURE:     DXR - DXR CHEST PA (OR AP) AND LATERAL  - July 29, 2012 [DATE]
RESULT:     Elevation left hemidiaphragm. This is stable from 06/30/2011.
Cardiomegaly. Atelectasis right lung base.

[Series 8: x chest ap · 0.14mm/px · 3 of 3 slices shown]
[im 1/3]
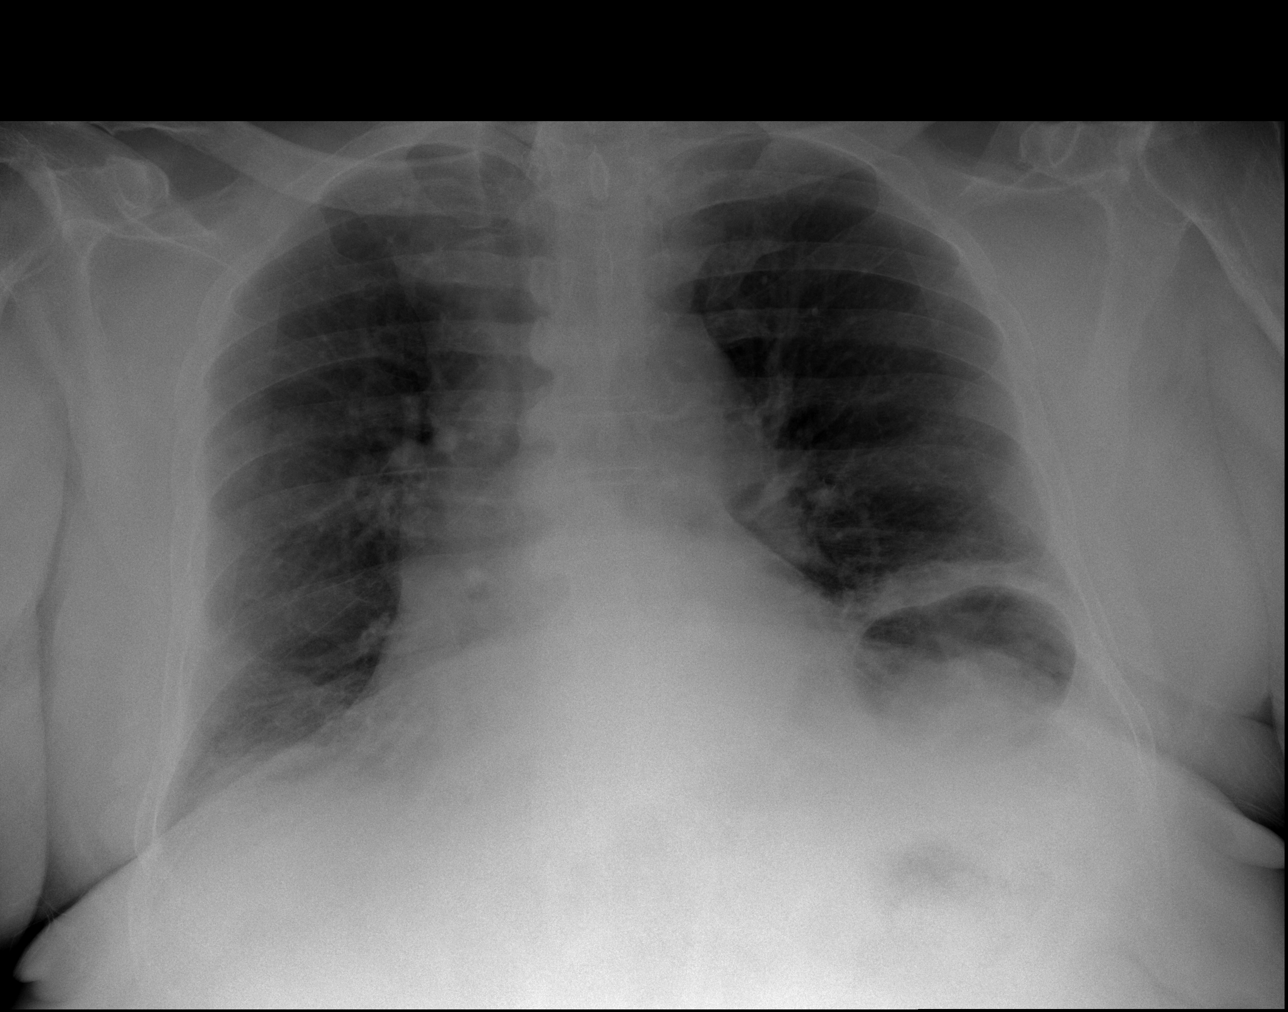
[im 2/3]
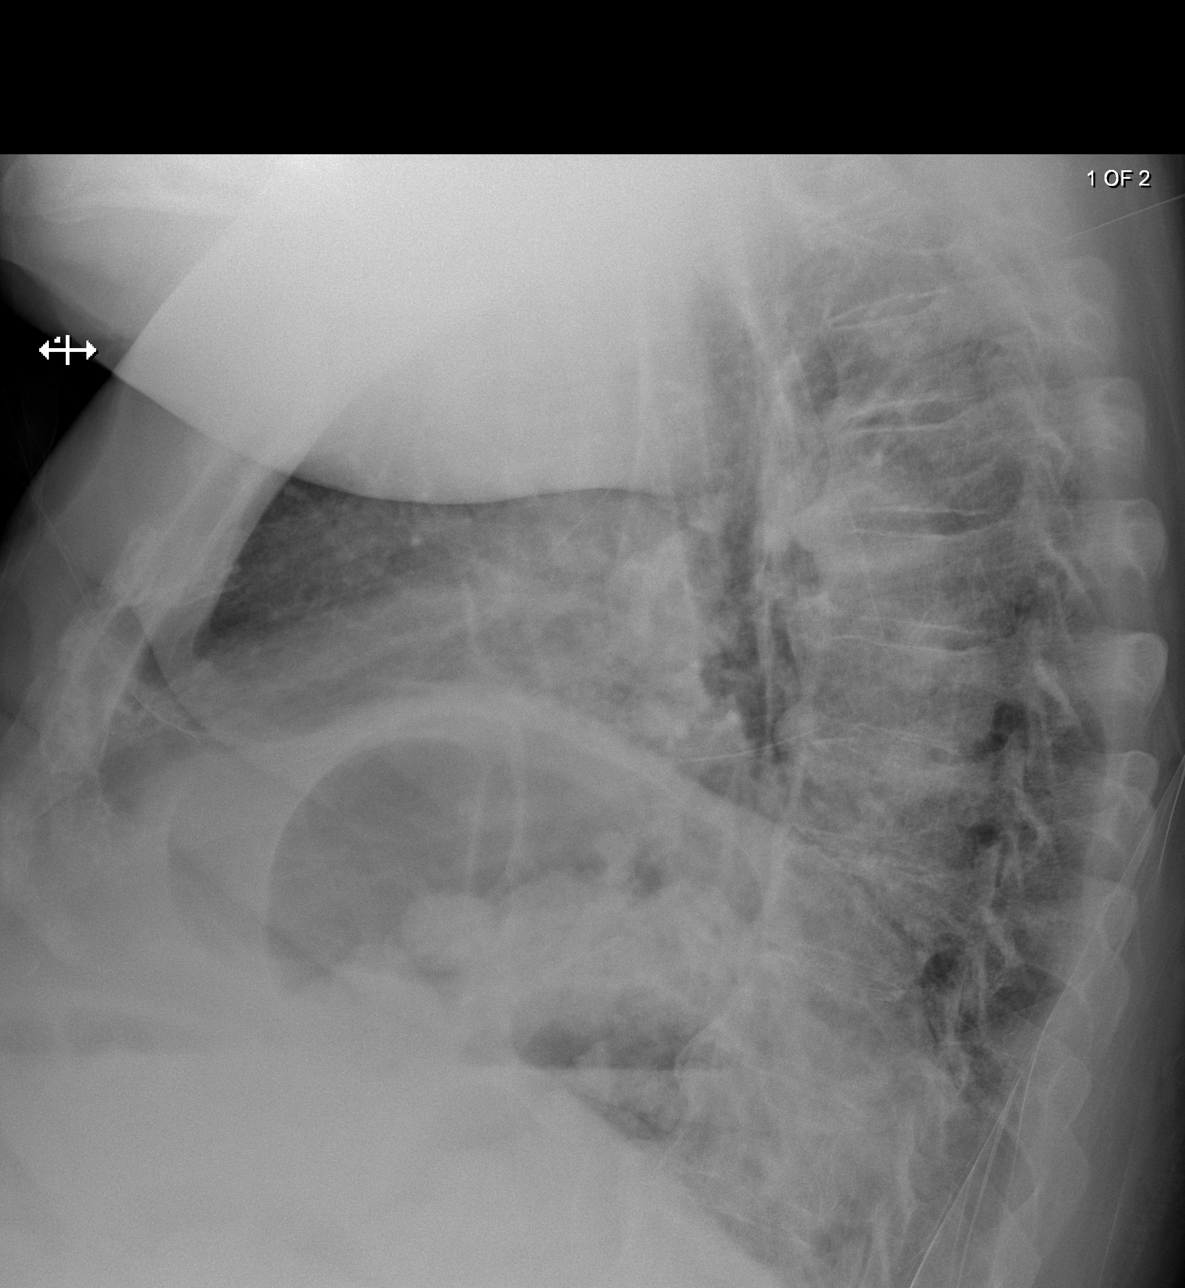
[im 3/3]
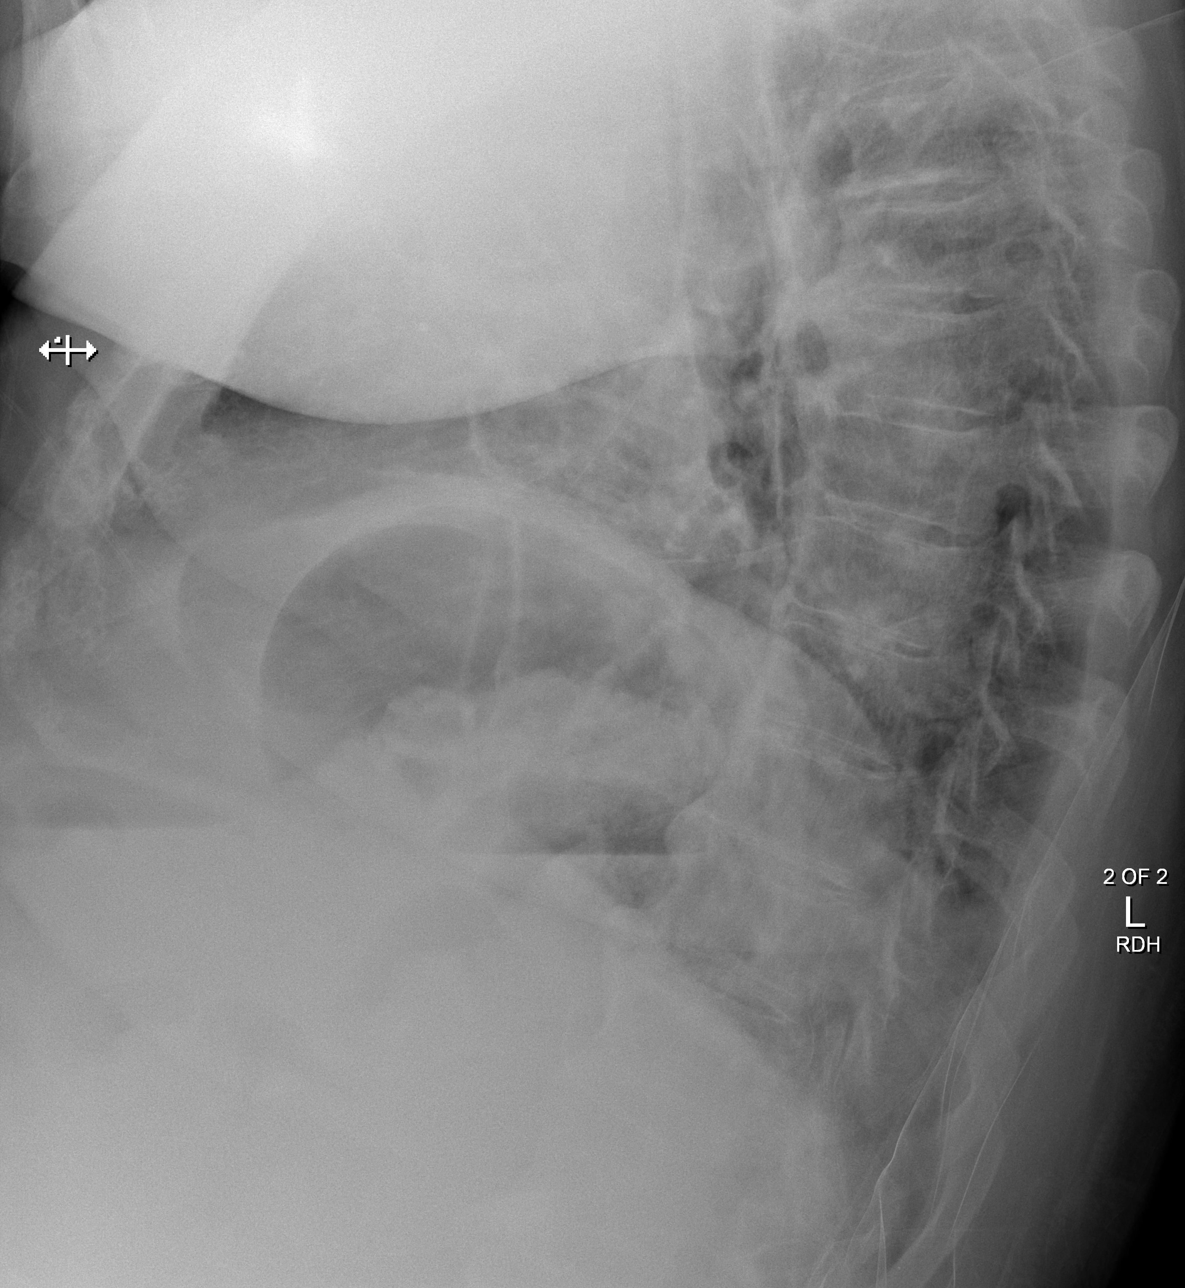

[3 of 3 positions shown; findings below may reference images not displayed]

IMPRESSION: Elevation left hemidiaphragm. Cardiomegaly. No acute
pulmonary disease.

## 2013-11-15 IMAGING — CR DG ABDOMEN 2V
1 series · 3 of 3 positions shown · non-contrast
Comparison: none

REASON FOR EXAM: rule out small bowel obstruction
COMMENTS:

PROCEDURE:     DXR - DXR ABDOMEN 2 V FLAT AND ERECT  - July 29, 2012  [DATE]
RESULT:     Soft tissue structures are unremarkable. Stool noted colon. No
free air. Atelectasis versus pneumonia in lung bases.

[Series 5: x abdomen erect · 0.14mm/px · 3 of 3 slices shown]
[im 1/3]
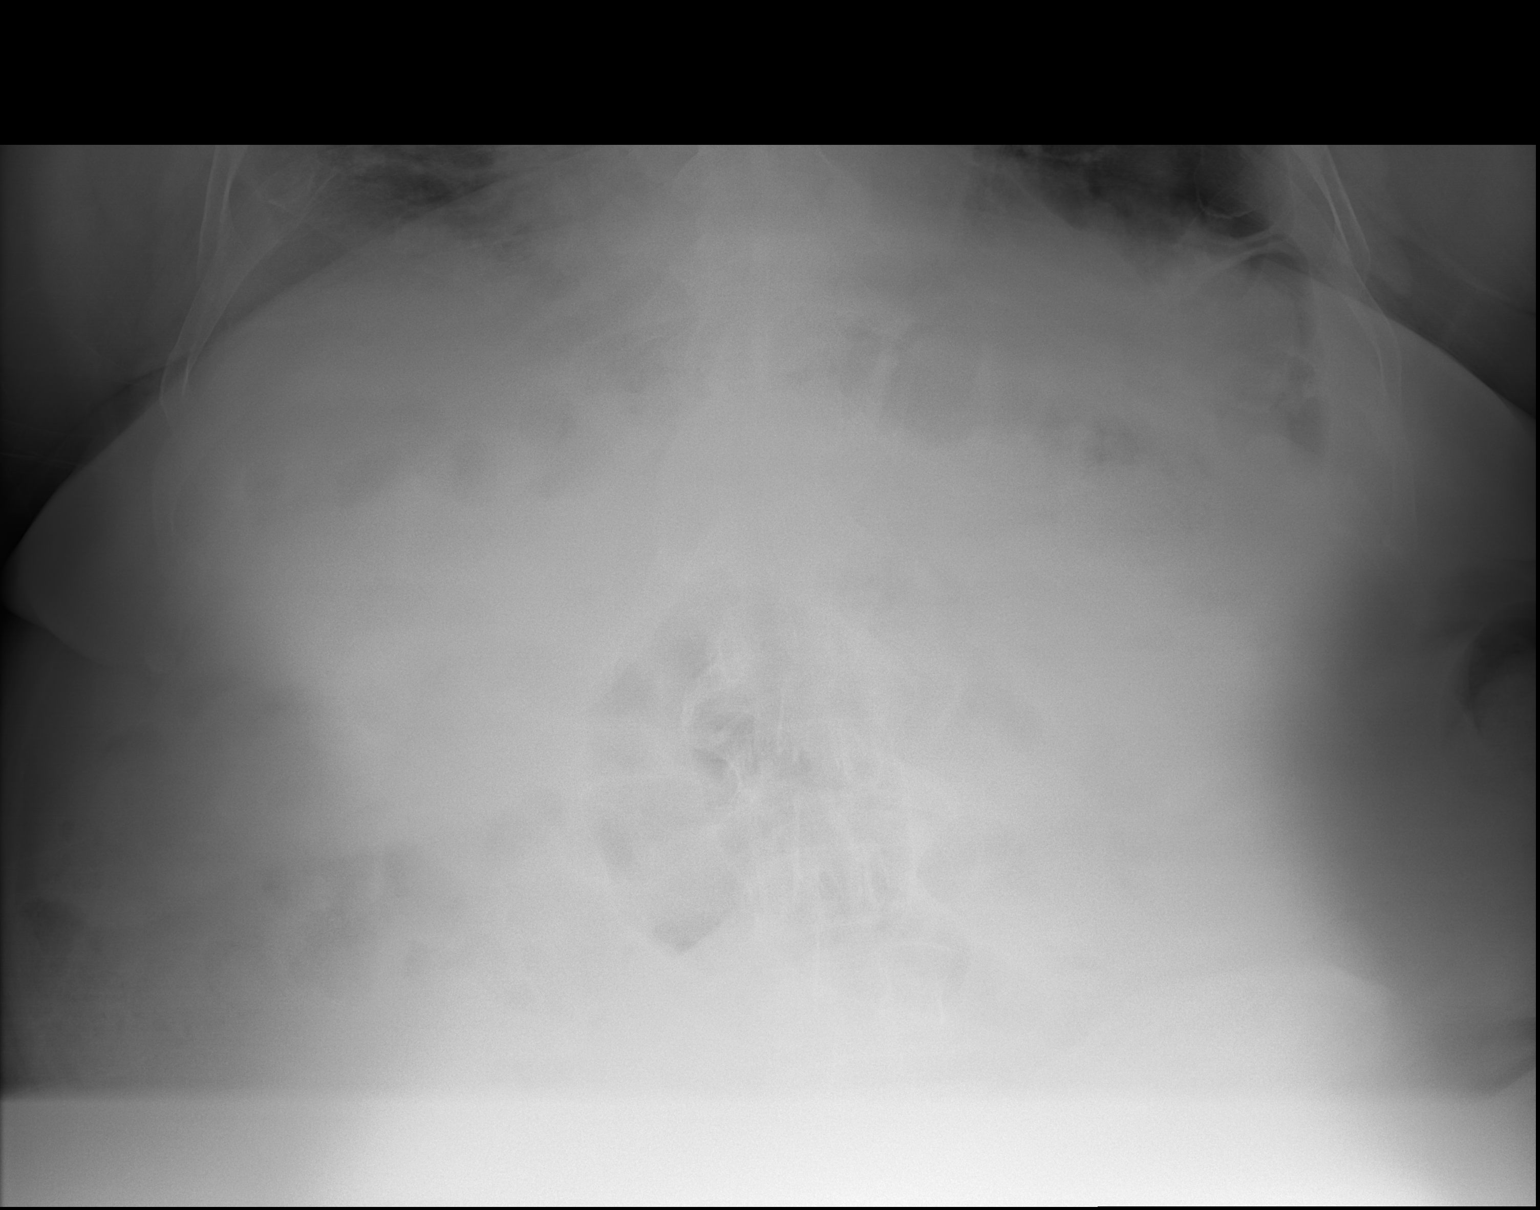
[im 2/3]
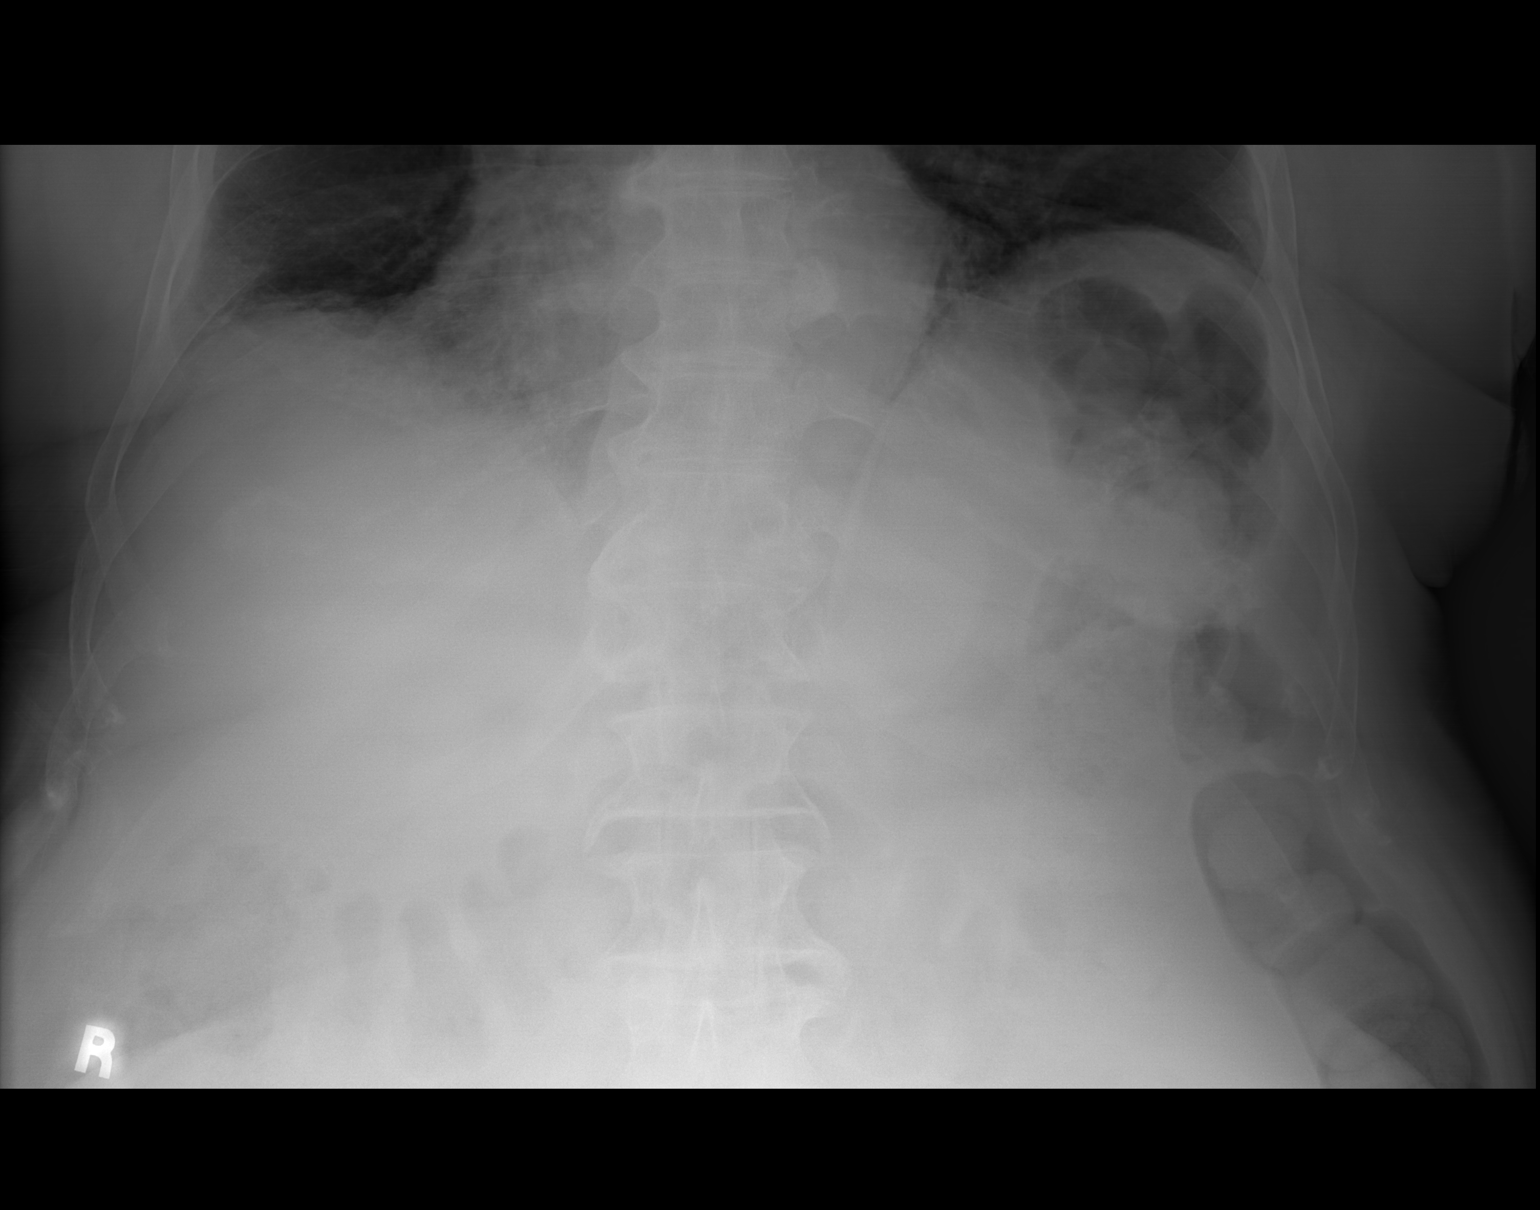
[im 3/3]
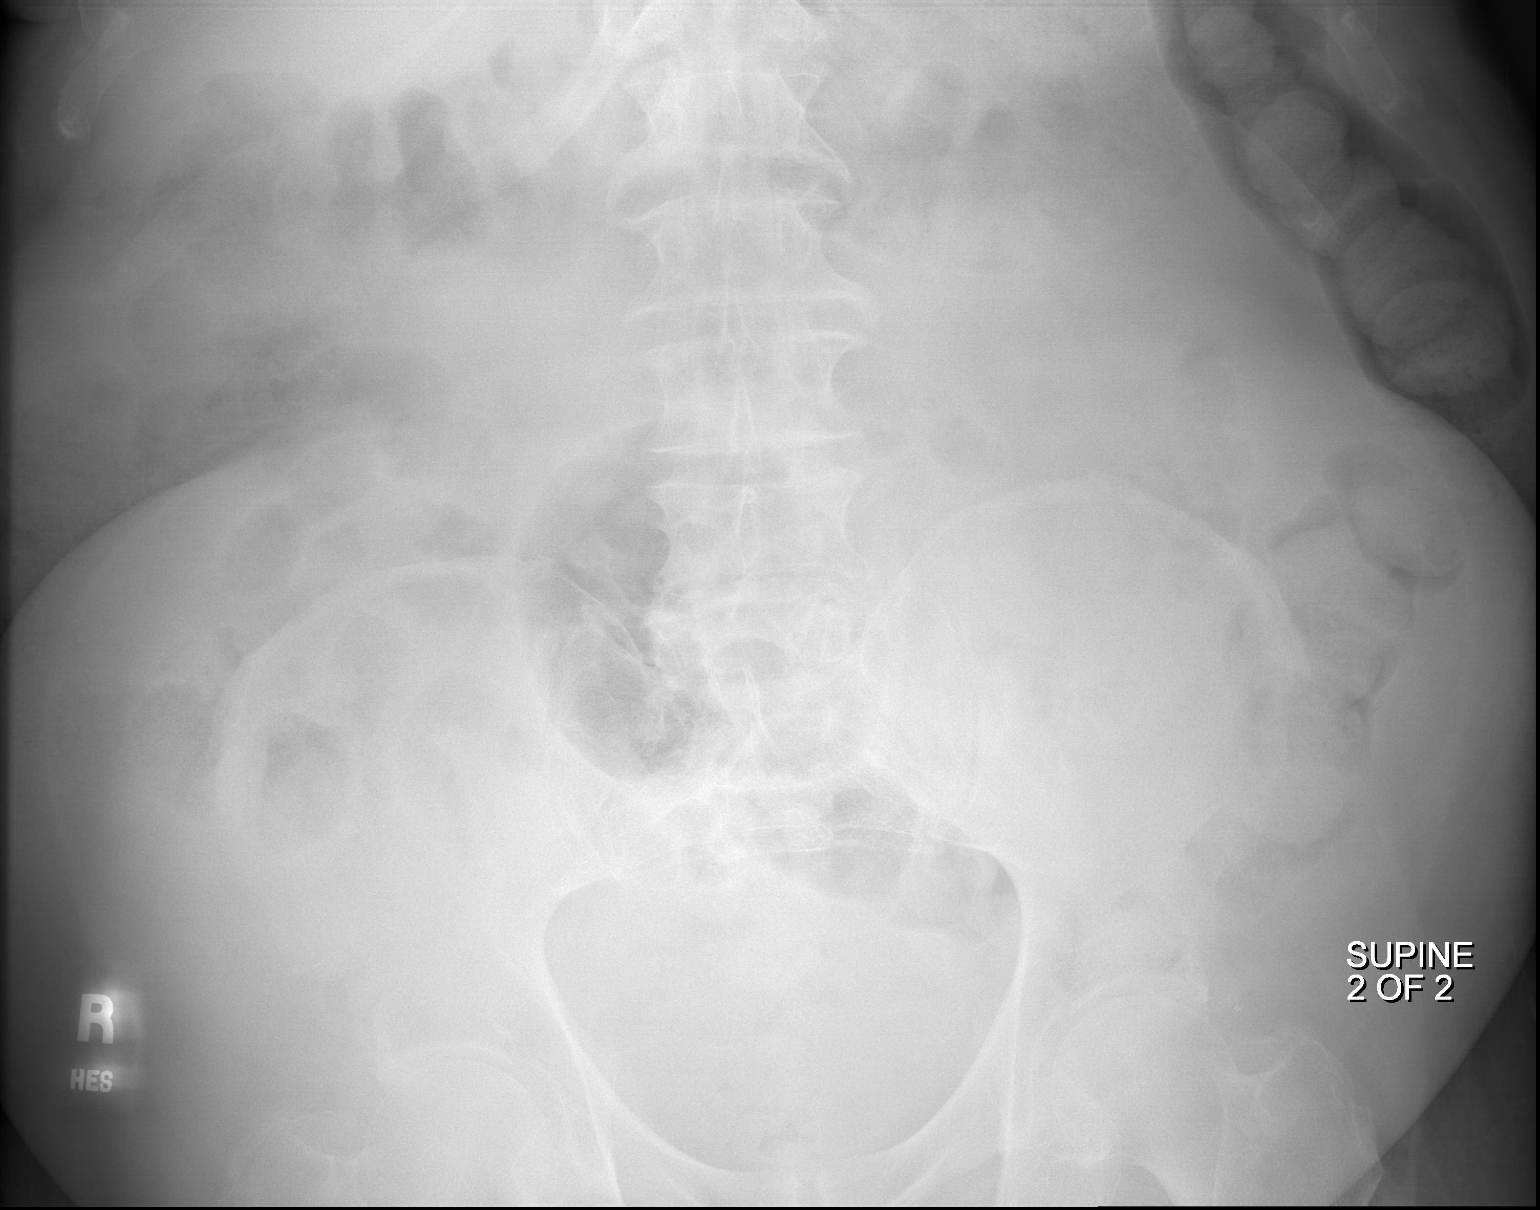

[3 of 3 positions shown; findings below may reference images not displayed]

IMPRESSION: Atelectasis versus pneumonia lung bases. Exam is otherwise
unremarkable. No evidence of bowel obstruction or free air.

## 2013-11-16 DIAGNOSIS — IMO0001 Reserved for inherently not codable concepts without codable children: Secondary | ICD-10-CM | POA: Diagnosis not present

## 2013-11-16 DIAGNOSIS — I10 Essential (primary) hypertension: Secondary | ICD-10-CM | POA: Diagnosis not present

## 2013-11-16 DIAGNOSIS — K5900 Constipation, unspecified: Secondary | ICD-10-CM | POA: Diagnosis not present

## 2013-11-16 DIAGNOSIS — R5381 Other malaise: Secondary | ICD-10-CM | POA: Diagnosis not present

## 2013-11-16 DIAGNOSIS — E119 Type 2 diabetes mellitus without complications: Secondary | ICD-10-CM | POA: Diagnosis not present

## 2013-11-16 DIAGNOSIS — R262 Difficulty in walking, not elsewhere classified: Secondary | ICD-10-CM | POA: Diagnosis not present

## 2013-11-16 DIAGNOSIS — M6250 Muscle wasting and atrophy, not elsewhere classified, unspecified site: Secondary | ICD-10-CM | POA: Diagnosis not present

## 2013-11-16 DIAGNOSIS — C411 Malignant neoplasm of mandible: Secondary | ICD-10-CM | POA: Diagnosis not present

## 2013-11-16 DIAGNOSIS — R109 Unspecified abdominal pain: Secondary | ICD-10-CM | POA: Diagnosis not present

## 2013-11-16 DIAGNOSIS — M6281 Muscle weakness (generalized): Secondary | ICD-10-CM | POA: Diagnosis not present

## 2013-12-31 DIAGNOSIS — R262 Difficulty in walking, not elsewhere classified: Secondary | ICD-10-CM | POA: Diagnosis not present

## 2013-12-31 DIAGNOSIS — M6281 Muscle weakness (generalized): Secondary | ICD-10-CM | POA: Diagnosis not present

## 2013-12-31 DIAGNOSIS — C411 Malignant neoplasm of mandible: Secondary | ICD-10-CM | POA: Diagnosis not present

## 2013-12-31 DIAGNOSIS — I10 Essential (primary) hypertension: Secondary | ICD-10-CM | POA: Diagnosis not present

## 2014-01-12 DIAGNOSIS — C411 Malignant neoplasm of mandible: Secondary | ICD-10-CM | POA: Diagnosis not present

## 2014-01-12 DIAGNOSIS — Z483 Aftercare following surgery for neoplasm: Secondary | ICD-10-CM | POA: Diagnosis not present

## 2014-01-12 DIAGNOSIS — Z9181 History of falling: Secondary | ICD-10-CM | POA: Diagnosis not present

## 2014-01-12 DIAGNOSIS — I10 Essential (primary) hypertension: Secondary | ICD-10-CM | POA: Diagnosis not present

## 2014-01-12 DIAGNOSIS — Z794 Long term (current) use of insulin: Secondary | ICD-10-CM | POA: Diagnosis not present

## 2014-01-12 DIAGNOSIS — E119 Type 2 diabetes mellitus without complications: Secondary | ICD-10-CM | POA: Diagnosis not present

## 2014-01-14 DIAGNOSIS — N182 Chronic kidney disease, stage 2 (mild): Secondary | ICD-10-CM | POA: Diagnosis not present

## 2014-01-14 DIAGNOSIS — M542 Cervicalgia: Secondary | ICD-10-CM | POA: Diagnosis not present

## 2014-01-14 DIAGNOSIS — E1129 Type 2 diabetes mellitus with other diabetic kidney complication: Secondary | ICD-10-CM | POA: Diagnosis not present

## 2014-01-14 DIAGNOSIS — D102 Benign neoplasm of floor of mouth: Secondary | ICD-10-CM | POA: Diagnosis not present

## 2014-01-14 DIAGNOSIS — E119 Type 2 diabetes mellitus without complications: Secondary | ICD-10-CM | POA: Diagnosis not present

## 2014-01-14 DIAGNOSIS — E1165 Type 2 diabetes mellitus with hyperglycemia: Secondary | ICD-10-CM | POA: Diagnosis not present

## 2014-01-14 DIAGNOSIS — M545 Low back pain, unspecified: Secondary | ICD-10-CM | POA: Diagnosis not present

## 2014-01-14 DIAGNOSIS — E785 Hyperlipidemia, unspecified: Secondary | ICD-10-CM | POA: Diagnosis not present

## 2014-01-14 DIAGNOSIS — I10 Essential (primary) hypertension: Secondary | ICD-10-CM | POA: Diagnosis not present

## 2014-01-14 DIAGNOSIS — E559 Vitamin D deficiency, unspecified: Secondary | ICD-10-CM | POA: Diagnosis not present

## 2014-02-20 DIAGNOSIS — D165 Benign neoplasm of lower jaw bone: Secondary | ICD-10-CM | POA: Diagnosis not present

## 2014-03-06 ENCOUNTER — Ambulatory Visit: Payer: Self-pay | Admitting: Podiatry

## 2014-03-11 DIAGNOSIS — E1165 Type 2 diabetes mellitus with hyperglycemia: Secondary | ICD-10-CM | POA: Diagnosis not present

## 2014-03-11 DIAGNOSIS — N182 Chronic kidney disease, stage 2 (mild): Secondary | ICD-10-CM | POA: Diagnosis not present

## 2014-03-11 DIAGNOSIS — R413 Other amnesia: Secondary | ICD-10-CM | POA: Diagnosis not present

## 2014-03-11 DIAGNOSIS — IMO0001 Reserved for inherently not codable concepts without codable children: Secondary | ICD-10-CM | POA: Diagnosis not present

## 2014-03-11 DIAGNOSIS — N058 Unspecified nephritic syndrome with other morphologic changes: Secondary | ICD-10-CM | POA: Diagnosis not present

## 2014-03-11 DIAGNOSIS — E1129 Type 2 diabetes mellitus with other diabetic kidney complication: Secondary | ICD-10-CM | POA: Diagnosis not present

## 2014-03-21 DIAGNOSIS — E119 Type 2 diabetes mellitus without complications: Secondary | ICD-10-CM | POA: Diagnosis not present

## 2014-03-21 DIAGNOSIS — L84 Corns and callosities: Secondary | ICD-10-CM | POA: Diagnosis not present

## 2014-03-21 DIAGNOSIS — L608 Other nail disorders: Secondary | ICD-10-CM | POA: Diagnosis not present

## 2014-03-21 DIAGNOSIS — M7989 Other specified soft tissue disorders: Secondary | ICD-10-CM | POA: Diagnosis not present

## 2014-03-22 DIAGNOSIS — F0390 Unspecified dementia without behavioral disturbance: Secondary | ICD-10-CM | POA: Diagnosis not present

## 2014-03-22 DIAGNOSIS — I10 Essential (primary) hypertension: Secondary | ICD-10-CM | POA: Diagnosis not present

## 2014-03-22 DIAGNOSIS — E119 Type 2 diabetes mellitus without complications: Secondary | ICD-10-CM | POA: Diagnosis not present

## 2014-03-22 DIAGNOSIS — Z9181 History of falling: Secondary | ICD-10-CM | POA: Diagnosis not present

## 2014-03-22 DIAGNOSIS — Z794 Long term (current) use of insulin: Secondary | ICD-10-CM | POA: Diagnosis not present

## 2014-03-25 DIAGNOSIS — F0390 Unspecified dementia without behavioral disturbance: Secondary | ICD-10-CM | POA: Diagnosis not present

## 2014-03-25 DIAGNOSIS — I10 Essential (primary) hypertension: Secondary | ICD-10-CM | POA: Diagnosis not present

## 2014-03-25 DIAGNOSIS — Z9181 History of falling: Secondary | ICD-10-CM | POA: Diagnosis not present

## 2014-03-25 DIAGNOSIS — E119 Type 2 diabetes mellitus without complications: Secondary | ICD-10-CM | POA: Diagnosis not present

## 2014-03-25 DIAGNOSIS — Z794 Long term (current) use of insulin: Secondary | ICD-10-CM | POA: Diagnosis not present

## 2014-03-28 DIAGNOSIS — Z9181 History of falling: Secondary | ICD-10-CM | POA: Diagnosis not present

## 2014-03-28 DIAGNOSIS — I10 Essential (primary) hypertension: Secondary | ICD-10-CM | POA: Diagnosis not present

## 2014-03-28 DIAGNOSIS — E119 Type 2 diabetes mellitus without complications: Secondary | ICD-10-CM | POA: Diagnosis not present

## 2014-03-28 DIAGNOSIS — Z794 Long term (current) use of insulin: Secondary | ICD-10-CM | POA: Diagnosis not present

## 2014-03-28 DIAGNOSIS — F0390 Unspecified dementia without behavioral disturbance: Secondary | ICD-10-CM | POA: Diagnosis not present

## 2014-04-16 DIAGNOSIS — E1165 Type 2 diabetes mellitus with hyperglycemia: Secondary | ICD-10-CM | POA: Diagnosis not present

## 2014-04-16 DIAGNOSIS — E1129 Type 2 diabetes mellitus with other diabetic kidney complication: Secondary | ICD-10-CM | POA: Diagnosis not present

## 2014-04-16 DIAGNOSIS — N182 Chronic kidney disease, stage 2 (mild): Secondary | ICD-10-CM | POA: Diagnosis not present

## 2014-04-16 DIAGNOSIS — N058 Unspecified nephritic syndrome with other morphologic changes: Secondary | ICD-10-CM | POA: Diagnosis not present

## 2014-04-16 DIAGNOSIS — R413 Other amnesia: Secondary | ICD-10-CM | POA: Diagnosis not present

## 2014-04-18 DIAGNOSIS — Z79899 Other long term (current) drug therapy: Secondary | ICD-10-CM | POA: Diagnosis not present

## 2014-04-18 DIAGNOSIS — D649 Anemia, unspecified: Secondary | ICD-10-CM | POA: Diagnosis not present

## 2014-04-18 DIAGNOSIS — E119 Type 2 diabetes mellitus without complications: Secondary | ICD-10-CM | POA: Diagnosis not present

## 2014-05-15 DIAGNOSIS — H9190 Unspecified hearing loss, unspecified ear: Secondary | ICD-10-CM | POA: Diagnosis not present

## 2014-05-15 DIAGNOSIS — E1122 Type 2 diabetes mellitus with diabetic chronic kidney disease: Secondary | ICD-10-CM | POA: Diagnosis not present

## 2014-05-15 DIAGNOSIS — N182 Chronic kidney disease, stage 2 (mild): Secondary | ICD-10-CM | POA: Diagnosis not present

## 2014-05-15 DIAGNOSIS — F0390 Unspecified dementia without behavioral disturbance: Secondary | ICD-10-CM | POA: Diagnosis not present

## 2014-05-15 DIAGNOSIS — N08 Glomerular disorders in diseases classified elsewhere: Secondary | ICD-10-CM | POA: Diagnosis not present

## 2014-05-24 DIAGNOSIS — H6120 Impacted cerumen, unspecified ear: Secondary | ICD-10-CM | POA: Diagnosis not present

## 2014-05-27 DIAGNOSIS — J301 Allergic rhinitis due to pollen: Secondary | ICD-10-CM | POA: Diagnosis not present

## 2014-05-27 DIAGNOSIS — H902 Conductive hearing loss, unspecified: Secondary | ICD-10-CM | POA: Diagnosis not present

## 2014-05-27 DIAGNOSIS — H903 Sensorineural hearing loss, bilateral: Secondary | ICD-10-CM | POA: Diagnosis not present

## 2014-06-05 DIAGNOSIS — L602 Onychogryphosis: Secondary | ICD-10-CM | POA: Diagnosis not present

## 2014-06-05 DIAGNOSIS — E1151 Type 2 diabetes mellitus with diabetic peripheral angiopathy without gangrene: Secondary | ICD-10-CM | POA: Diagnosis not present

## 2014-06-29 DIAGNOSIS — E119 Type 2 diabetes mellitus without complications: Secondary | ICD-10-CM | POA: Diagnosis not present

## 2014-06-29 DIAGNOSIS — I10 Essential (primary) hypertension: Secondary | ICD-10-CM | POA: Diagnosis not present

## 2014-06-29 DIAGNOSIS — F0390 Unspecified dementia without behavioral disturbance: Secondary | ICD-10-CM | POA: Diagnosis not present

## 2014-08-06 DIAGNOSIS — E1165 Type 2 diabetes mellitus with hyperglycemia: Secondary | ICD-10-CM | POA: Diagnosis not present

## 2014-08-13 HISTORY — PX: OTHER SURGICAL HISTORY: SHX169

## 2014-08-15 DIAGNOSIS — R1311 Dysphagia, oral phase: Secondary | ICD-10-CM | POA: Diagnosis not present

## 2014-08-15 DIAGNOSIS — E1165 Type 2 diabetes mellitus with hyperglycemia: Secondary | ICD-10-CM | POA: Diagnosis not present

## 2014-08-19 DIAGNOSIS — E1165 Type 2 diabetes mellitus with hyperglycemia: Secondary | ICD-10-CM | POA: Diagnosis not present

## 2014-08-19 DIAGNOSIS — R1311 Dysphagia, oral phase: Secondary | ICD-10-CM | POA: Diagnosis not present

## 2014-08-21 DIAGNOSIS — R1311 Dysphagia, oral phase: Secondary | ICD-10-CM | POA: Diagnosis not present

## 2014-08-21 DIAGNOSIS — E1165 Type 2 diabetes mellitus with hyperglycemia: Secondary | ICD-10-CM | POA: Diagnosis not present

## 2014-08-24 DIAGNOSIS — E1165 Type 2 diabetes mellitus with hyperglycemia: Secondary | ICD-10-CM | POA: Diagnosis not present

## 2014-08-24 DIAGNOSIS — R1311 Dysphagia, oral phase: Secondary | ICD-10-CM | POA: Diagnosis not present

## 2014-08-25 DIAGNOSIS — E1165 Type 2 diabetes mellitus with hyperglycemia: Secondary | ICD-10-CM | POA: Diagnosis not present

## 2014-08-25 DIAGNOSIS — R1311 Dysphagia, oral phase: Secondary | ICD-10-CM | POA: Diagnosis not present

## 2014-08-27 DIAGNOSIS — R1311 Dysphagia, oral phase: Secondary | ICD-10-CM | POA: Diagnosis not present

## 2014-08-27 DIAGNOSIS — E1165 Type 2 diabetes mellitus with hyperglycemia: Secondary | ICD-10-CM | POA: Diagnosis not present

## 2014-08-28 DIAGNOSIS — D165 Benign neoplasm of lower jaw bone: Secondary | ICD-10-CM | POA: Diagnosis not present

## 2014-08-28 DIAGNOSIS — E049 Nontoxic goiter, unspecified: Secondary | ICD-10-CM | POA: Diagnosis not present

## 2014-08-28 DIAGNOSIS — R1311 Dysphagia, oral phase: Secondary | ICD-10-CM | POA: Diagnosis not present

## 2014-08-28 DIAGNOSIS — Z6841 Body Mass Index (BMI) 40.0 and over, adult: Secondary | ICD-10-CM | POA: Diagnosis not present

## 2014-08-28 DIAGNOSIS — E1165 Type 2 diabetes mellitus with hyperglycemia: Secondary | ICD-10-CM | POA: Diagnosis not present

## 2014-10-08 DIAGNOSIS — E1165 Type 2 diabetes mellitus with hyperglycemia: Secondary | ICD-10-CM | POA: Diagnosis not present

## 2014-10-08 DIAGNOSIS — M6281 Muscle weakness (generalized): Secondary | ICD-10-CM | POA: Diagnosis not present

## 2014-10-14 DIAGNOSIS — N182 Chronic kidney disease, stage 2 (mild): Secondary | ICD-10-CM | POA: Diagnosis not present

## 2014-10-14 DIAGNOSIS — N08 Glomerular disorders in diseases classified elsewhere: Secondary | ICD-10-CM | POA: Diagnosis not present

## 2014-10-14 DIAGNOSIS — I129 Hypertensive chronic kidney disease with stage 1 through stage 4 chronic kidney disease, or unspecified chronic kidney disease: Secondary | ICD-10-CM | POA: Diagnosis not present

## 2014-10-14 DIAGNOSIS — E1122 Type 2 diabetes mellitus with diabetic chronic kidney disease: Secondary | ICD-10-CM | POA: Diagnosis not present

## 2014-10-18 DIAGNOSIS — E1165 Type 2 diabetes mellitus with hyperglycemia: Secondary | ICD-10-CM | POA: Diagnosis not present

## 2014-10-18 DIAGNOSIS — M6281 Muscle weakness (generalized): Secondary | ICD-10-CM | POA: Diagnosis not present

## 2014-10-20 DIAGNOSIS — F0390 Unspecified dementia without behavioral disturbance: Secondary | ICD-10-CM | POA: Diagnosis not present

## 2014-10-20 DIAGNOSIS — I10 Essential (primary) hypertension: Secondary | ICD-10-CM | POA: Diagnosis not present

## 2014-10-20 DIAGNOSIS — N182 Chronic kidney disease, stage 2 (mild): Secondary | ICD-10-CM | POA: Diagnosis not present

## 2014-10-20 DIAGNOSIS — E119 Type 2 diabetes mellitus without complications: Secondary | ICD-10-CM | POA: Diagnosis not present

## 2014-10-21 DIAGNOSIS — E1165 Type 2 diabetes mellitus with hyperglycemia: Secondary | ICD-10-CM | POA: Diagnosis not present

## 2014-10-21 DIAGNOSIS — M6281 Muscle weakness (generalized): Secondary | ICD-10-CM | POA: Diagnosis not present

## 2014-10-22 DIAGNOSIS — E1165 Type 2 diabetes mellitus with hyperglycemia: Secondary | ICD-10-CM | POA: Diagnosis not present

## 2014-10-22 DIAGNOSIS — M6281 Muscle weakness (generalized): Secondary | ICD-10-CM | POA: Diagnosis not present

## 2014-10-24 DIAGNOSIS — E1165 Type 2 diabetes mellitus with hyperglycemia: Secondary | ICD-10-CM | POA: Diagnosis not present

## 2014-10-24 DIAGNOSIS — M6281 Muscle weakness (generalized): Secondary | ICD-10-CM | POA: Diagnosis not present

## 2014-10-25 DIAGNOSIS — E1165 Type 2 diabetes mellitus with hyperglycemia: Secondary | ICD-10-CM | POA: Diagnosis not present

## 2014-10-25 DIAGNOSIS — M6281 Muscle weakness (generalized): Secondary | ICD-10-CM | POA: Diagnosis not present

## 2014-10-28 DIAGNOSIS — E1165 Type 2 diabetes mellitus with hyperglycemia: Secondary | ICD-10-CM | POA: Diagnosis not present

## 2014-10-28 DIAGNOSIS — M6281 Muscle weakness (generalized): Secondary | ICD-10-CM | POA: Diagnosis not present

## 2014-10-29 DIAGNOSIS — M6281 Muscle weakness (generalized): Secondary | ICD-10-CM | POA: Diagnosis not present

## 2014-10-29 DIAGNOSIS — E1165 Type 2 diabetes mellitus with hyperglycemia: Secondary | ICD-10-CM | POA: Diagnosis not present

## 2014-10-30 DIAGNOSIS — E1165 Type 2 diabetes mellitus with hyperglycemia: Secondary | ICD-10-CM | POA: Diagnosis not present

## 2014-10-30 DIAGNOSIS — M6281 Muscle weakness (generalized): Secondary | ICD-10-CM | POA: Diagnosis not present

## 2014-10-31 DIAGNOSIS — Z6841 Body Mass Index (BMI) 40.0 and over, adult: Secondary | ICD-10-CM | POA: Diagnosis not present

## 2014-10-31 DIAGNOSIS — M6281 Muscle weakness (generalized): Secondary | ICD-10-CM | POA: Diagnosis not present

## 2014-10-31 DIAGNOSIS — D165 Benign neoplasm of lower jaw bone: Secondary | ICD-10-CM | POA: Diagnosis not present

## 2014-10-31 DIAGNOSIS — E1165 Type 2 diabetes mellitus with hyperglycemia: Secondary | ICD-10-CM | POA: Diagnosis not present

## 2014-10-31 DIAGNOSIS — E049 Nontoxic goiter, unspecified: Secondary | ICD-10-CM | POA: Diagnosis not present

## 2014-11-01 DIAGNOSIS — E1165 Type 2 diabetes mellitus with hyperglycemia: Secondary | ICD-10-CM | POA: Diagnosis not present

## 2014-11-01 DIAGNOSIS — M545 Low back pain: Secondary | ICD-10-CM | POA: Diagnosis not present

## 2014-11-04 DIAGNOSIS — E1165 Type 2 diabetes mellitus with hyperglycemia: Secondary | ICD-10-CM | POA: Diagnosis not present

## 2014-11-04 DIAGNOSIS — M545 Low back pain: Secondary | ICD-10-CM | POA: Diagnosis not present

## 2014-11-05 DIAGNOSIS — M545 Low back pain: Secondary | ICD-10-CM | POA: Diagnosis not present

## 2014-11-05 DIAGNOSIS — E1165 Type 2 diabetes mellitus with hyperglycemia: Secondary | ICD-10-CM | POA: Diagnosis not present

## 2014-11-06 DIAGNOSIS — M545 Low back pain: Secondary | ICD-10-CM | POA: Diagnosis not present

## 2014-11-06 DIAGNOSIS — E1165 Type 2 diabetes mellitus with hyperglycemia: Secondary | ICD-10-CM | POA: Diagnosis not present

## 2014-11-07 DIAGNOSIS — E1165 Type 2 diabetes mellitus with hyperglycemia: Secondary | ICD-10-CM | POA: Diagnosis not present

## 2014-11-07 DIAGNOSIS — M545 Low back pain: Secondary | ICD-10-CM | POA: Diagnosis not present

## 2014-11-08 DIAGNOSIS — E1165 Type 2 diabetes mellitus with hyperglycemia: Secondary | ICD-10-CM | POA: Diagnosis not present

## 2014-11-08 DIAGNOSIS — M545 Low back pain: Secondary | ICD-10-CM | POA: Diagnosis not present

## 2014-11-11 DIAGNOSIS — M545 Low back pain: Secondary | ICD-10-CM | POA: Diagnosis not present

## 2014-11-11 DIAGNOSIS — E1165 Type 2 diabetes mellitus with hyperglycemia: Secondary | ICD-10-CM | POA: Diagnosis not present

## 2014-11-12 DIAGNOSIS — E1165 Type 2 diabetes mellitus with hyperglycemia: Secondary | ICD-10-CM | POA: Diagnosis not present

## 2014-11-12 DIAGNOSIS — M545 Low back pain: Secondary | ICD-10-CM | POA: Diagnosis not present

## 2014-11-13 DIAGNOSIS — M545 Low back pain: Secondary | ICD-10-CM | POA: Diagnosis not present

## 2014-11-13 DIAGNOSIS — E1165 Type 2 diabetes mellitus with hyperglycemia: Secondary | ICD-10-CM | POA: Diagnosis not present

## 2014-11-14 DIAGNOSIS — M545 Low back pain: Secondary | ICD-10-CM | POA: Diagnosis not present

## 2014-11-14 DIAGNOSIS — E1165 Type 2 diabetes mellitus with hyperglycemia: Secondary | ICD-10-CM | POA: Diagnosis not present

## 2014-11-15 DIAGNOSIS — E119 Type 2 diabetes mellitus without complications: Secondary | ICD-10-CM | POA: Diagnosis not present

## 2014-11-15 DIAGNOSIS — L602 Onychogryphosis: Secondary | ICD-10-CM | POA: Diagnosis not present

## 2014-11-18 DIAGNOSIS — E1122 Type 2 diabetes mellitus with diabetic chronic kidney disease: Secondary | ICD-10-CM | POA: Diagnosis not present

## 2014-11-18 DIAGNOSIS — N183 Chronic kidney disease, stage 3 (moderate): Secondary | ICD-10-CM | POA: Diagnosis not present

## 2014-11-18 DIAGNOSIS — I129 Hypertensive chronic kidney disease with stage 1 through stage 4 chronic kidney disease, or unspecified chronic kidney disease: Secondary | ICD-10-CM | POA: Diagnosis not present

## 2014-11-18 DIAGNOSIS — R609 Edema, unspecified: Secondary | ICD-10-CM | POA: Diagnosis not present

## 2014-11-19 NOTE — Consult Note (Signed)
81 year old with left knee  pain and swelling. and xrays reviewed and patient examined. Of particular note is that patient felt a pop when injury occurred on falling and was unable to walk following that. exam, he has swelling in the suprapatellar area and a palpable gap in the quadriceps tendon. He cannot straight leg raise nor extend the knee. Distally the neurovascular exam is intact. shows significant soft tissue swelling the suprapatellar area, and a low riding patella. complete rupture quadriceps tendon. I advised him of the findings. Proper treatment of this injury is surgical repair, and I explained the nature of the procedure fully to him including the usual post op rehab. He understands and agrees, and I have placed him on the schedule for repair on Thursday, Dec. 26.  Electronic Signatures: Lucas Mallow (MD)  (Signed on 24-Dec-13 14:58)  Authored  Last Updated: 24-Dec-13 14:58 by Lucas Mallow (MD)

## 2014-11-19 NOTE — Op Note (Signed)
PATIENT NAME:  Daniel [name], Daniel [name] MR#:  [id number] DATE OF BIRTH:  16-Apr-1928  DATE OF PROCEDURE:  07/27/2012  PREOPERATIVE DIAGNOSIS:  Complete rupture, quadriceps tendon, left knee.   POSTOPERATIVE DIAGNOSIS:  Complete rupture, quadriceps tendon, left knee.   PROCEDURE:  Repair of acute rupture, quadriceps tendon, left knee.   SURGEON:  Lucas Mallow, MD   ANESTHESIA:  General.   TOURNIQUET TIME:  Approximately 40 minutes.   COMPLICATIONS:  None.   DRAINS:  None.   DESCRIPTION OF PROCEDURE:  300 mg of clindamycin was given intravenously prior to the procedure. General anesthesia is induced. The left lower extremity is thoroughly prepped with alcohol and ChloraPrep and draped in standard sterile fashion. The extremity is wrapped out with the Esmarch bandage and pneumatic tourniquet elevated to 350 mmHg. A standard longitudinal midline incision is made over the patella and the dissection carried down to the patella and the patellar tendon rupture. There is seen to be complete rupture from the superior aspect of the patella. The wound is thoroughly irrigated of a large amount of clot present and the quadriceps tendon mobilized. Internal fixation is then performed for two #5 Tycron sutures passed through bone holes in the patella and then woven into the quadriceps tendon using a Kessler-type suture. The tourniquet was let down in order to relieve stress from the quadriceps muscle at the time of repair and towel clip was used to pull the patella proximally. Following this, multiple #2 Tycron sutures were used to repair the retinaculum and then the extra frayed tendon present. The wound is thoroughly irrigated multiple times. Skin edges subcutaneously are closed with 3-0 Vicryl and the skin is closed with the skin stapler. A soft bulky dressing is applied. The patient is returned to the knee immobilizer which he had been in and returned to the recovery room in satisfactory condition having tolerated  the procedure quite well.    ____________________________ Lucas Mallow, MD ces:si D: 07/27/2012 15:13:19 ET T: 07/27/2012 23:09:11 ET JOB#: [id number]  cc: Lucas Mallow, MD, <Dictator> Lucas Mallow MD ELECTRONICALLY SIGNED 08/01/2012 9:49

## 2014-11-19 NOTE — H&P (Signed)
PATIENT NAME:  Daniel [name], Daniel [name] MR#:  [id number] DATE OF BIRTH:  [birth year]  DATE OF ADMISSION:  07/25/2012  PRIMARY CARE PHYSICIAN: Willey Blade.   REFERRING PHYSICIAN: Loletta Specter.   CHIEF COMPLAINT: Left knee joint pain status post fall.   HISTORY OF THE PRESENT ILLNESS: The patient is an 81 year old African American male who was in his usual state of health until today when he went to the mailbox, and unfortunately, he slipped on the mud. He fell down, injuring his left knee. He was unable to stand up or walk. The patient was transferred here to the hospital for evaluation, where he had an x-ray which showed no fracture; however, he has significant soft tissue swelling and he is unable to walk. The patient lives at home alone. The patient was admitted for pain management and further evaluation with orthopedic surgery. He denies any other serious injury.   REVIEW OF SYSTEMS:   CONSTITUTIONAL: Denies any fever. No chills. No fatigue.  EYES: No blurring of vision. No double vision.  ENT: No hearing impairment. No sore throat. No dysphagia.  CARDIOVASCULAR: No chest pain. No shortness of breath. No syncope.  RESPIRATORY: No cough. No sputum production. No hemoptysis.  GASTROINTESTINAL: No abdominal pain. No vomiting. No diarrhea.  GENITOURINARY: No dysuria. No frequency of urination.  MUSCULOSKELETAL: No joint pain other than the left knee pain. No muscular pain or swelling.  INTEGUMENTARY: No skin rash. No ulcers.  NEUROLOGY: No focal weakness. No seizure activity. No headache.  PSYCHIATRY: No anxiety. No depression.  ENDOCRINE: No polyuria or polydipsia. No heat or cold intolerance.  HEMATOLOGY: No easy bruisability. No lymph node enlargement.   PAST MEDICAL HISTORY: Systemic hypertension, diabetes mellitus type 2, sickle cell trait.   FAMILY HISTORY: He does not have much information about his family but reports that his mother had sickle cell trait.   SOCIAL HABITS: Nonsmoker.  No history of alcohol or drug abuse.   SOCIAL HISTORY: He is widowed. Lives at home alone. He is a retired Administrator.   ADMISSION MEDICATIONS: Micardis with hydrochlorothiazide 80/12.5 once a day, Zocor 20 mg a day, vitamin D2 50,000 units twice a week, metoprolol 25 mg twice a day, amlodipine 10 mg a day, meclizine 25 mg 3 times a day p.r.n. for dizziness, Humalog mix 75/25 taking 34 units in the morning and 24 units in the evening, aspirin 81 mg a day.   ALLERGY: PENICILLIN.   PHYSICAL EXAMINATION:  VITAL SIGNS: Blood pressure 185/67, respiratory rate 20, pulse 60, temperature 98.4, oxygen saturation 96%.  GENERAL APPEARANCE: Elderly male lying in bed in no acute distress.  HEAD AND NECK: No pallor. No icterus. No cyanosis. Ear examination revealed normal hearing. No lesions. No discharge. No bleeding. Nasal mucosa was normal without ulcers. No discharge. Oropharyngeal area showed no lesions. No oral thrush. No exudates. He has dentures for the upper jaw. Eye examination revealed normal eyelids and conjunctivae. Pupils are constricted, could not detect reactivity to light.  NECK: Supple. Trachea at midline. No thyromegaly. No cervical lymphadenopathy. No masses.  HEART: Normal S1, S2. No S3, S4. No murmur. No gallop. No carotid bruits.  RESPIRATORY: Normal breathing pattern without the use of accessory muscles. No rales. No wheezing.  ABDOMEN: Morbidly obese, soft without tenderness. No hepatosplenomegaly. No masses. No hernias.  MUSCULOSKELETAL: Left knee joint soft tissue swelling and tenderness. The leg is placed in a knee immobilizer. There is no other joint swelling. No clubbing.  SKIN: No ulcers. No subcutaneous nodules.  NEUROLOGIC: Cranial nerves II through XII are intact. No focal motor deficit. Sensation was intact.  PSYCHIATRIC: The patient is alert and oriented x3. Mood and affect were normal.   LABORATORY FINDINGS: Basic metabolic profile is pending. CBC and a prothrombin time  is pending as well. X-ray of the left knee showed no evidence of acute fracture or dislocation. There is significant soft tissue swelling in the prepatellar and suprapatellar region. There are mild age-related degenerative changes.   ASSESSMENT:  1. Left knee contusion with soft tissue swelling status post fall due to slipping in the mud. The patient is unable to ambulate.  2. Systemic hypertension, uncontrolled.  3. Diabetes mellitus type 2, on insulin.   PLAN: Will admit to the hospital, pain control. Orthopedic consultation. Continue usage of knee immobilizer. Subcutaneous heparin for deep vein thrombosis prophylaxis. Will start the metoprolol, Micardis and amlodipine and monitor blood pressure response. Continue insulin doses per home medications. Accu-Chek and sliding scale. He does not have a living will and his code is full code.  TIME SPENT: More than 50 minutes.    ____________________________ Clovis Pu. Lenore Manner, MD amd:gb D: 07/24/2012 23:02:11 ET T: 07/25/2012 03:11:06 ET JOB#: [id number]  cc: Clovis Pu. Lenore Manner, MD, <Dictator> Ellin Saba MD ELECTRONICALLY SIGNED 07/25/2012 21:39

## 2014-11-22 NOTE — Discharge Summary (Signed)
PATIENT NAME:  Daniel [name], Daniel [name] MR#:  [id number] DATE OF BIRTH:  [birth year]  DATE OF ADMISSION:  07/26/2012 DATE OF DISCHARGE:  08/01/2012  DISCHARGE DIAGNOSES: 1. Complete rupture of quadriceps tendon, left knee.  2. Hypertension.  3. Diabetes mellitus type 2.  4. Sickle cell trait.  5. Mild renal impairment.   OPERATIONS/PROCEDURES PERFORMED: Repair of complete quadriceps tendon rupture, left knee, on 07/27/2012.   HISTORY AND PHYSICAL EXAMINATION: As written on admission.   LABORATORY DATA: As noted in the chart.   COURSE IN HOSPITAL: Preoperative medical consultation for surgical clearance was obtained and is as listed on the chart. The patient on 07/27/2012 was taken to the operating room where repair of complete acute rupture of the quadriceps tendon was performed without difficulty. He tolerated the procedure quite well. He was advanced up into the chair. He initially had a moderate amount of pain and physical therapy was limited. He also developed increasing creatinine, which was treated medically, as per the medical notes within the chart. It was felt on 08/01/2012 that he could be safely discharged to rehab. His full orders are as on the printed order sheet, in the computer record. He is to have physical therapy for ambulation, full weight-bearing in the knee immobilizer. He is to be on a 2000 calorie carbohydrate-controlled diet. He is to follow up to see Dr. Tamala Julian in the office in 10 days for exam and probable staple removal. ____________________________ Lucas Mallow, MD ces:sb D: 08/15/2012 11:17:27 ET    T: 08/15/2012 11:23:35 ET       JOB#: [id number] cc: Lucas Mallow, MD, <Dictator> Lucas Mallow MD ELECTRONICALLY SIGNED 08/23/2012 15:52

## 2014-11-22 NOTE — Discharge Summary (Signed)
PATIENT NAME:  Daniel [name], Daniel [name] MR#:  [id number] DATE OF BIRTH:  1928/07/15  DATE OF ADMISSION:  07/28/2012 DATE OF DISCHARGE:  08/01/2012  REASON FOR ADMISSION: Left knee joint pain status post fall.   DISCHARGE DIAGNOSES: 1. Quadriceps tendon rupture, complete rupture, status-post surgical repair on 07/28/2012.  2. Hypertension.  3. Acute kidney injury on chronic kidney disease.  4. Type 2 diabetes, insulin-dependent. 5. Hyperlipidemia.  6. Constipation.   DISPOSITION: Rehab skilled nursing facility.   DISCHARGE MEDICATIONS:   1. Aspirin 81 mg once a day. 2. Meclizine 25 mg 3 times a day as needed for dizziness. 3. Amlodipine 10 mg once a day. 4. Zocor 20 mg once a day. 5. Humalog 75/25 pen 24 units once a day in the evening and 34 units once a day in the morning.  6. Vitamin D 250,000 units 2 times weekly. 7. Metoprolol 50 mg every 12 hours; this is an increase in dose from previous dose of 25.  8. Oxycodone 10 mg quick-release every 3 to 4 hours as needed for pain.  9. Enoxaparin (Lovenox) 40 mg every 12 hours.  MEDICATIONS TO STOP:  Micardis and hydrochlorothiazide due to increased creatinine.   DISCHARGE FOLLOWUP: Follow up with Dr. Christophe [name], [facility], and family physician, Dr. Wallene [name].  Also follow up with Dr. Juleen [name], renal, in 1 to 2 weeks for follow-up on acute kidney injury, which has improved.   IMPORTANT RESULTS: Creatinine 1.72 with improvement at discharge to 1.48. Urine creatinine 91. Urine protein negative. Parathyroid hormone 50, which is overall normal.   Ultrasound of kidneys bilaterally: No hydronephrosis, bladder mildly distended.   HOSPITAL COURSE: Mr. Daniel [name] is a very nice 81 year old gentleman who came to [facility] with a history of a fall and complaining of left knee pain. He was found to have significant pain and swelling. X-ray showed no fracture, however, there was significant soft tissue  swelling. The patient was not able to walk. The patient lived by himself for what he was admitted. Orthopedic surgery was consulted and they determined that the patient had a complete rupture of the quadriceps tendon. The patient was taken for surgical repair on 07/27/2012 by Dr. Christophe [name] with repair of acute rupture of quadriceps tendon, of the left knee. The patient was put in an immobilizer and started to improve. Pain was overall controlled. The patient developed acute kidney injury, which was likely due to prerenal problems. He already had history of chronic kidney disease for what he was evaluated by renal. They determined that the patient was fluid depleted, fluids were given and his creatinine improved. The patient is much better today. He is due to be discharged. His creatinine is back to baseline and they are okay with starting back Micardis, although his metoprolol was increased and his blood pressure is improved for what I am going to get him off Micardis for now and possibly start Micardis during the hospitalization at the rehab, if his blood pressure continues to be stable. Please restart Micardis once blood pressure is either normal for 3 to 4 days, at a low dose, or if it is elevated you can continue the same dose. I will recommend not to continue hydrochlorothiazide.  ____________________________ Waterproof Sink, MD rsg:sb D: 08/01/2012 11:24:58 ET T: 08/01/2012 11:44:51 ET JOB#: [id number]  cc: Ipava Sink, MD, <Dictator> Daniel Eden America Brown MD ELECTRONICALLY SIGNED 08/03/2012 7:49

## 2014-11-24 NOTE — Discharge Summary (Signed)
PATIENT NAME:  Daniel [name], Daniel [name] MR#:  [id number] DATE OF BIRTH:  1928/05/14  DATE OF ADMISSION:  01/29/2012 DATE OF DISCHARGE:  01/30/2012  DIAGNOSES:  1. Dizziness. 2. Ambulatory dysfunction possibly due to vertigo. No evidence of cerebrovascular accident on further evaluation. 3. Anemia. 4. Chronic kidney disease.  5. Diabetes.  6. Hypertension.  7. Hyperlipidemia.   DISPOSITION: Patient is being discharged home with home health. Referral for Meals on Wheels has also been made for the patient.    DIET: Low sodium, 1800 calorie ADA diet.   ACTIVITY: As tolerated. Patient is being discharged home with home health and physical therapy.   DISCHARGE MEDICATIONS:  1. Norvasc 10 mg daily.  2. Zocor 20 mg daily.  3. Meclizine 25 mg q.8 hours p.r.n.  4. Micardis/HCTZ 80/12 .5 mg daily.  5. Lopressor 25 mg b.i.d.  6. Humalog mix 75/25, 14 units subcutaneously b.i.d.  7. Aspirin 81 mg daily.  8. Tradjenta 4 mg once a day.   LABORATORY, DIAGNOSTIC AND RADIOLOGICAL DATA: CAT scan of the head showed no acute abnormalities. MRI of the brain showed no acute stroke. Carotid ultrasound showed no hemodynamically significant stenosis. Echo showed normal left ventricular function, left ventricular hypertrophy, mild MR and TR. White count normal, hemoglobin 12.5, hematocrit 37.2, platelet count 188, normal. Creatinine 1.77 on admission, 1.51 by the time of discharge, VLDL 40. LDL 106, cholesterol 180, triglycerides 237, HDL 27. LFTs normal. Cardiac enzymes normal. TSH normal.   HOSPITAL COURSE: Patient is an 81 year old male with past medical history of diabetes and hypertension who presented with sudden onset of dizziness. He was admitted with a diagnosis of dizziness and ambulatory dysfunction. He had extensive work-up including CAT scan of the head, MRI of the brain, carotid ultrasound, echo, TSH all of which were normal. Orthostatics were checked and he was not orthostatic. He was evaluated by physical  therapy who recommended home health. He possibly had vertigo and was started on meclizine with good results. He was no longer dizzy by the time of discharge. He had stable normocytic anemia of chronic disease. He also has chronic kidney disease. His baseline creatinine is around 1.76. Initially his Accu-Cheks are elevated, however, after initiation of ADA diet, insulin sliding scale and Humalog his diabetes remained well controlled. He had intermittent elevated blood pressure and was started on Norvasc in addition to his other medications. He was found to have hyperlipidemia and has been started on therapy. He is being discharged home in a stable condition with home health and physical therapy.   TIME SPENT: 45 minutes.   ____________________________ Cherre Huger, MD sp:cms D: 01/30/2012 14:30:17 ET T: 01/31/2012 10:10:22 ET JOB#: [id number]  cc: Cherre Huger, MD, <Dictator> Cherre Huger MD ELECTRONICALLY SIGNED 01/31/2012 17:08

## 2014-11-24 NOTE — H&P (Signed)
PATIENT NAME:  Daniel [name], Daniel [name] MR#:  [id number] DATE OF BIRTH:  1928-03-12  DATE OF ADMISSION:  01/29/2012  PRIMARY CARE PHYSICIAN: Wallene Huh, MD  ER PHYSICIAN: Robb Matar, MD  ADMITTING PHYSICIAN: Lodema Hong, MD  PRESENTING COMPLAINT: Dizziness.   HISTORY: The patient is an 81 year old male who was in his usual state of health until this evening when he started having sudden onset dizziness. Symptoms have since persisted and he has had difficulty walking. He denies any nausea or vomiting, no loss of consciousness, no seizures, no chest pain, PND, or orthopnea, but the patient has occasional pedal edema. He denies any change in medication. No history of fever, long distance travel, or sick contacts. Dizziness was generalized without any headache. He denies any ringing noise in the ears or history of head trauma. No ENT bleed. For this he was seen in the emergency room today. He was noted to be orthostatic positive and dehydrated. He had some rehydration and is still having recurrent dizziness on changing positions. For this he was referred to the hospitalist for further evaluation. Work-up also included a CT of the head which showed no acute problems, but showed age-related atrophy and small vessel ischemic changes. No troponin test done so far, but  EKG shows normal sinus rhythm at rate of 66.   REVIEW OF SYSTEMS: CONSTITUTIONAL: Positive for fatigue and weakness, but no weight loss or weight gain. No fever. EYES: Positive for mild blurred vision, but no pain, redness, or discharge. ENT: No tinnitus or ear pain. Has prior history of mild hearing loss, but no epistaxis and no difficulty swallowing or redness of oropharynx. RESPIRATORY: No cough or shortness of breath. CARDIOVASCULAR: No chest pain, PND, orthopnea, palpitations, or syncope, but has pedal edema. GI: Has some nausea but no vomiting. No diarrhea. No abdominal pain or change in bowel habits. GU: No dysuria, frequency, or  incontinence. ENDOCRINE: No polyuria, polydipsia, heat or cold intolerance. HEMATOLOGIC: No anemia, easy bruising, bleeding, or swollen glands. SKIN: No rashes or change in hair or skin texture. MUSCULOSKELETAL: No joint pain, redness, swelling, or limited activity except for the left wrist which has been mildly swollen over the last four days. NEUROLOGICAL: No numbness, tremors, dementia, or headache. Has some dizziness. Denies room spinning around him. PSYCH: No anxiety, depression, or nervousness.   PAST MEDICAL HISTORY:  1. Diabetes. 2. Hypertension.   PAST SURGICAL HISTORY:  1. Back surgery repair.  2. Hernia repair.   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 No alcohol, tobacco, or recreational drug use.   FAMILY HISTORY: Positive for high blood pressure and sickle cell disease.   ALLERGIES: Penicillin, which causes him shortness of breath.  MEDICATIONS:  1. Aspirin 81 mg daily.  2. Humalog 75/25, 40 units twice a day. 3. Tradjenta 5 mg daily.  4. Micardis/HCT 80/12.5 mg 1 tablet daily.  5. Metoprolol 25 mg twice daily.   PHYSICAL EXAMINATION:   VITAL SIGNS: Temperature 98.6, pulse 67, respirations 18, blood pressure on arrival 165/79 and now is 142/65, and the patient is orthostatic positive.   GENERAL: Elderly, overweight male lying on the gurney, awake, alert, and oriented to time, place, and person, in no overt distress.   HEENT: Atraumatic, normocephalic. Pupils equal, round, and reactive to light and accommodation. Extraocular movements intact. Mucous membranes pink, dry.   NECK: Supple. No JV distention.   CHEST: Good air entry. Clear to auscultation.   HEART: Regular rate and rhythm. No murmurs.  ABDOMEN: Obese, pendulous, moves with respiration, nontender. Bowel sounds normoactive. No organomegaly.   EXTREMITIES: Edema of bilateral lower extremities. Normal range of movement. No deformity.   MUSCULOSKELETAL: Mild swelling and fullness in  the left wrist with some tenderness.   NEUROLOGICAL: Cranial nerves II through XII grossly intact. No focal motor or sensory deficits. Cerebellar symptoms - no dysmetria and no dysdiadochokinesis but has positive Romberg symptoms.   PSYCH: Affect appropriate to situation.  DATA: EKG shows normal sinus rhythm, rate 66.   CT of head shows no acute abnormality, but shows age-related atrophy and small vessel ischemic changes.   LABS: CBC unremarkable. Chemistries: Sodium 136, potassium 3.8, creatinine 1.7, anion gap 7, BUN 33, glucose 296, calcium 9. Normal LFTs.   Urinalysis is negative.    IMPRESSION:  1. Dizziness  - to rule out ACS versus arrhythmia versus cerebellar lesion.  2. Ambulatory dysfunction secondary to #1 above.  3. Type 2 diabetes, poorly controlled.  4. Hypertension, stable.   PLAN: Admit to the general medical floor under observation for serial cardiac enzymes, TSH, magnesium, fasting lipid profile, MRI in the a.m. Meclizine p.r.n., aspirin, sliding scale insulin, telemonitoring, echocardiogram, physical therapy tomorrow, sliding scale insulin, GI prophylaxis with Protonix, and seizure, fall, and aspiration precautions. Resume the rest of the outpatient medications and adjust as needed. We will hold off on HCTZ.   CODE STATUS: FULL CODE.   TOTAL PATIENT CARE TIME: 50 minutes.  ____________________________ Jules Husbands Pearletha Furl, MD mia:slb D: 01/29/2012 04:09:05 ET T: 01/29/2012 08:30:36 ET JOB#: [id number]  cc: Sergei Delo I. Pearletha Furl, MD, <Dictator> Wallene Huh, MD Carola Frost MD ELECTRONICALLY SIGNED 01/29/2012 20:14

## 2014-12-02 DIAGNOSIS — Z5181 Encounter for therapeutic drug level monitoring: Secondary | ICD-10-CM | POA: Diagnosis not present

## 2014-12-05 DIAGNOSIS — H2513 Age-related nuclear cataract, bilateral: Secondary | ICD-10-CM | POA: Diagnosis not present

## 2014-12-05 DIAGNOSIS — E119 Type 2 diabetes mellitus without complications: Secondary | ICD-10-CM | POA: Diagnosis not present

## 2014-12-12 DIAGNOSIS — M25551 Pain in right hip: Secondary | ICD-10-CM | POA: Diagnosis not present

## 2014-12-12 DIAGNOSIS — Z79899 Other long term (current) drug therapy: Secondary | ICD-10-CM | POA: Diagnosis not present

## 2014-12-12 DIAGNOSIS — M545 Low back pain: Secondary | ICD-10-CM | POA: Diagnosis not present

## 2014-12-12 DIAGNOSIS — E1122 Type 2 diabetes mellitus with diabetic chronic kidney disease: Secondary | ICD-10-CM | POA: Diagnosis not present

## 2014-12-12 DIAGNOSIS — N183 Chronic kidney disease, stage 3 (moderate): Secondary | ICD-10-CM | POA: Diagnosis not present

## 2014-12-12 DIAGNOSIS — N08 Glomerular disorders in diseases classified elsewhere: Secondary | ICD-10-CM | POA: Diagnosis not present

## 2014-12-16 DIAGNOSIS — E1165 Type 2 diabetes mellitus with hyperglycemia: Secondary | ICD-10-CM | POA: Diagnosis not present

## 2014-12-16 DIAGNOSIS — Z5181 Encounter for therapeutic drug level monitoring: Secondary | ICD-10-CM | POA: Diagnosis not present

## 2014-12-27 DIAGNOSIS — N182 Chronic kidney disease, stage 2 (mild): Secondary | ICD-10-CM | POA: Diagnosis not present

## 2014-12-27 DIAGNOSIS — E119 Type 2 diabetes mellitus without complications: Secondary | ICD-10-CM | POA: Diagnosis not present

## 2014-12-27 DIAGNOSIS — F0390 Unspecified dementia without behavioral disturbance: Secondary | ICD-10-CM | POA: Diagnosis not present

## 2014-12-27 DIAGNOSIS — M545 Low back pain: Secondary | ICD-10-CM | POA: Diagnosis not present

## 2015-01-08 DIAGNOSIS — M5441 Lumbago with sciatica, right side: Secondary | ICD-10-CM | POA: Diagnosis not present

## 2015-01-10 ENCOUNTER — Other Ambulatory Visit: Payer: Self-pay | Admitting: Orthopaedic Surgery

## 2015-01-10 DIAGNOSIS — M5441 Lumbago with sciatica, right side: Secondary | ICD-10-CM

## 2015-01-13 ENCOUNTER — Other Ambulatory Visit: Payer: Self-pay | Admitting: Orthopaedic Surgery

## 2015-01-13 ENCOUNTER — Ambulatory Visit
Admission: RE | Admit: 2015-01-13 | Discharge: 2015-01-13 | Disposition: A | Discharge status: Home / Self Care | Payer: Medicare Other | Source: Ambulatory Visit | Attending: Orthopaedic Surgery | Admitting: Orthopaedic Surgery

## 2015-01-13 DIAGNOSIS — M47817 Spondylosis without myelopathy or radiculopathy, lumbosacral region: Secondary | ICD-10-CM | POA: Diagnosis not present

## 2015-01-13 DIAGNOSIS — M5441 Lumbago with sciatica, right side: Secondary | ICD-10-CM

## 2015-01-13 DIAGNOSIS — M4807 Spinal stenosis, lumbosacral region: Secondary | ICD-10-CM | POA: Diagnosis not present

## 2015-01-16 DIAGNOSIS — M79604 Pain in right leg: Secondary | ICD-10-CM | POA: Diagnosis not present

## 2015-01-16 DIAGNOSIS — M159 Polyosteoarthritis, unspecified: Secondary | ICD-10-CM | POA: Diagnosis not present

## 2015-01-16 DIAGNOSIS — E1165 Type 2 diabetes mellitus with hyperglycemia: Secondary | ICD-10-CM | POA: Diagnosis not present

## 2015-01-16 DIAGNOSIS — M6281 Muscle weakness (generalized): Secondary | ICD-10-CM | POA: Diagnosis not present

## 2015-01-16 DIAGNOSIS — M545 Low back pain: Secondary | ICD-10-CM | POA: Diagnosis not present

## 2015-01-17 DIAGNOSIS — M159 Polyosteoarthritis, unspecified: Secondary | ICD-10-CM | POA: Diagnosis not present

## 2015-01-17 DIAGNOSIS — M79604 Pain in right leg: Secondary | ICD-10-CM | POA: Diagnosis not present

## 2015-01-17 DIAGNOSIS — M6281 Muscle weakness (generalized): Secondary | ICD-10-CM | POA: Diagnosis not present

## 2015-01-17 DIAGNOSIS — E1165 Type 2 diabetes mellitus with hyperglycemia: Secondary | ICD-10-CM | POA: Diagnosis not present

## 2015-01-17 DIAGNOSIS — M545 Low back pain: Secondary | ICD-10-CM | POA: Diagnosis not present

## 2015-01-21 DIAGNOSIS — M79604 Pain in right leg: Secondary | ICD-10-CM | POA: Diagnosis not present

## 2015-01-21 DIAGNOSIS — M159 Polyosteoarthritis, unspecified: Secondary | ICD-10-CM | POA: Diagnosis not present

## 2015-01-21 DIAGNOSIS — E1165 Type 2 diabetes mellitus with hyperglycemia: Secondary | ICD-10-CM | POA: Diagnosis not present

## 2015-01-21 DIAGNOSIS — M6281 Muscle weakness (generalized): Secondary | ICD-10-CM | POA: Diagnosis not present

## 2015-01-21 DIAGNOSIS — M545 Low back pain: Secondary | ICD-10-CM | POA: Diagnosis not present

## 2015-01-24 DIAGNOSIS — M159 Polyosteoarthritis, unspecified: Secondary | ICD-10-CM | POA: Diagnosis not present

## 2015-01-24 DIAGNOSIS — E1165 Type 2 diabetes mellitus with hyperglycemia: Secondary | ICD-10-CM | POA: Diagnosis not present

## 2015-01-24 DIAGNOSIS — M545 Low back pain: Secondary | ICD-10-CM | POA: Diagnosis not present

## 2015-01-24 DIAGNOSIS — M6281 Muscle weakness (generalized): Secondary | ICD-10-CM | POA: Diagnosis not present

## 2015-01-24 DIAGNOSIS — M79604 Pain in right leg: Secondary | ICD-10-CM | POA: Diagnosis not present

## 2015-01-26 DIAGNOSIS — M545 Low back pain: Secondary | ICD-10-CM | POA: Diagnosis not present

## 2015-01-26 DIAGNOSIS — M79604 Pain in right leg: Secondary | ICD-10-CM | POA: Diagnosis not present

## 2015-01-26 DIAGNOSIS — M6281 Muscle weakness (generalized): Secondary | ICD-10-CM | POA: Diagnosis not present

## 2015-01-26 DIAGNOSIS — M159 Polyosteoarthritis, unspecified: Secondary | ICD-10-CM | POA: Diagnosis not present

## 2015-01-26 DIAGNOSIS — E1165 Type 2 diabetes mellitus with hyperglycemia: Secondary | ICD-10-CM | POA: Diagnosis not present

## 2015-01-28 DIAGNOSIS — M6281 Muscle weakness (generalized): Secondary | ICD-10-CM | POA: Diagnosis not present

## 2015-01-28 DIAGNOSIS — M79604 Pain in right leg: Secondary | ICD-10-CM | POA: Diagnosis not present

## 2015-01-28 DIAGNOSIS — M159 Polyosteoarthritis, unspecified: Secondary | ICD-10-CM | POA: Diagnosis not present

## 2015-01-28 DIAGNOSIS — M545 Low back pain: Secondary | ICD-10-CM | POA: Diagnosis not present

## 2015-01-28 DIAGNOSIS — E1165 Type 2 diabetes mellitus with hyperglycemia: Secondary | ICD-10-CM | POA: Diagnosis not present

## 2015-01-30 DIAGNOSIS — M159 Polyosteoarthritis, unspecified: Secondary | ICD-10-CM | POA: Diagnosis not present

## 2015-01-30 DIAGNOSIS — E1165 Type 2 diabetes mellitus with hyperglycemia: Secondary | ICD-10-CM | POA: Diagnosis not present

## 2015-01-30 DIAGNOSIS — M6281 Muscle weakness (generalized): Secondary | ICD-10-CM | POA: Diagnosis not present

## 2015-01-30 DIAGNOSIS — M79604 Pain in right leg: Secondary | ICD-10-CM | POA: Diagnosis not present

## 2015-01-30 DIAGNOSIS — M545 Low back pain: Secondary | ICD-10-CM | POA: Diagnosis not present

## 2015-02-01 DIAGNOSIS — E119 Type 2 diabetes mellitus without complications: Secondary | ICD-10-CM | POA: Diagnosis not present

## 2015-02-01 DIAGNOSIS — N182 Chronic kidney disease, stage 2 (mild): Secondary | ICD-10-CM | POA: Diagnosis not present

## 2015-02-01 DIAGNOSIS — F0390 Unspecified dementia without behavioral disturbance: Secondary | ICD-10-CM | POA: Diagnosis not present

## 2015-02-01 DIAGNOSIS — I129 Hypertensive chronic kidney disease with stage 1 through stage 4 chronic kidney disease, or unspecified chronic kidney disease: Secondary | ICD-10-CM | POA: Diagnosis not present

## 2015-02-01 DIAGNOSIS — M545 Low back pain: Secondary | ICD-10-CM | POA: Diagnosis not present

## 2015-02-01 DIAGNOSIS — I2510 Atherosclerotic heart disease of native coronary artery without angina pectoris: Secondary | ICD-10-CM | POA: Diagnosis not present

## 2015-02-06 DIAGNOSIS — E119 Type 2 diabetes mellitus without complications: Secondary | ICD-10-CM | POA: Diagnosis not present

## 2015-02-06 DIAGNOSIS — M545 Low back pain: Secondary | ICD-10-CM | POA: Diagnosis not present

## 2015-02-06 DIAGNOSIS — I2510 Atherosclerotic heart disease of native coronary artery without angina pectoris: Secondary | ICD-10-CM | POA: Diagnosis not present

## 2015-02-06 DIAGNOSIS — I129 Hypertensive chronic kidney disease with stage 1 through stage 4 chronic kidney disease, or unspecified chronic kidney disease: Secondary | ICD-10-CM | POA: Diagnosis not present

## 2015-02-06 DIAGNOSIS — F0390 Unspecified dementia without behavioral disturbance: Secondary | ICD-10-CM | POA: Diagnosis not present

## 2015-02-06 DIAGNOSIS — N182 Chronic kidney disease, stage 2 (mild): Secondary | ICD-10-CM | POA: Diagnosis not present

## 2015-02-09 DIAGNOSIS — N182 Chronic kidney disease, stage 2 (mild): Secondary | ICD-10-CM | POA: Diagnosis not present

## 2015-02-09 DIAGNOSIS — F0390 Unspecified dementia without behavioral disturbance: Secondary | ICD-10-CM | POA: Diagnosis not present

## 2015-02-09 DIAGNOSIS — I2510 Atherosclerotic heart disease of native coronary artery without angina pectoris: Secondary | ICD-10-CM | POA: Diagnosis not present

## 2015-02-09 DIAGNOSIS — M545 Low back pain: Secondary | ICD-10-CM | POA: Diagnosis not present

## 2015-02-09 DIAGNOSIS — I129 Hypertensive chronic kidney disease with stage 1 through stage 4 chronic kidney disease, or unspecified chronic kidney disease: Secondary | ICD-10-CM | POA: Diagnosis not present

## 2015-02-09 DIAGNOSIS — E119 Type 2 diabetes mellitus without complications: Secondary | ICD-10-CM | POA: Diagnosis not present

## 2015-02-10 DIAGNOSIS — E784 Other hyperlipidemia: Secondary | ICD-10-CM | POA: Diagnosis not present

## 2015-02-10 DIAGNOSIS — R413 Other amnesia: Secondary | ICD-10-CM | POA: Diagnosis not present

## 2015-02-10 DIAGNOSIS — N08 Glomerular disorders in diseases classified elsewhere: Secondary | ICD-10-CM | POA: Diagnosis not present

## 2015-02-10 DIAGNOSIS — E1122 Type 2 diabetes mellitus with diabetic chronic kidney disease: Secondary | ICD-10-CM | POA: Diagnosis not present

## 2015-02-12 DIAGNOSIS — I129 Hypertensive chronic kidney disease with stage 1 through stage 4 chronic kidney disease, or unspecified chronic kidney disease: Secondary | ICD-10-CM | POA: Diagnosis not present

## 2015-02-12 DIAGNOSIS — I2510 Atherosclerotic heart disease of native coronary artery without angina pectoris: Secondary | ICD-10-CM | POA: Diagnosis not present

## 2015-02-12 DIAGNOSIS — N182 Chronic kidney disease, stage 2 (mild): Secondary | ICD-10-CM | POA: Diagnosis not present

## 2015-02-12 DIAGNOSIS — E119 Type 2 diabetes mellitus without complications: Secondary | ICD-10-CM | POA: Diagnosis not present

## 2015-02-12 DIAGNOSIS — F0390 Unspecified dementia without behavioral disturbance: Secondary | ICD-10-CM | POA: Diagnosis not present

## 2015-02-12 DIAGNOSIS — M545 Low back pain: Secondary | ICD-10-CM | POA: Diagnosis not present

## 2015-02-13 DIAGNOSIS — N182 Chronic kidney disease, stage 2 (mild): Secondary | ICD-10-CM | POA: Diagnosis not present

## 2015-02-13 DIAGNOSIS — I129 Hypertensive chronic kidney disease with stage 1 through stage 4 chronic kidney disease, or unspecified chronic kidney disease: Secondary | ICD-10-CM | POA: Diagnosis not present

## 2015-02-13 DIAGNOSIS — I2510 Atherosclerotic heart disease of native coronary artery without angina pectoris: Secondary | ICD-10-CM | POA: Diagnosis not present

## 2015-02-13 DIAGNOSIS — E119 Type 2 diabetes mellitus without complications: Secondary | ICD-10-CM | POA: Diagnosis not present

## 2015-02-13 DIAGNOSIS — M545 Low back pain: Secondary | ICD-10-CM | POA: Diagnosis not present

## 2015-02-13 DIAGNOSIS — F0390 Unspecified dementia without behavioral disturbance: Secondary | ICD-10-CM | POA: Diagnosis not present

## 2015-02-14 DIAGNOSIS — F0390 Unspecified dementia without behavioral disturbance: Secondary | ICD-10-CM | POA: Diagnosis not present

## 2015-02-14 DIAGNOSIS — I129 Hypertensive chronic kidney disease with stage 1 through stage 4 chronic kidney disease, or unspecified chronic kidney disease: Secondary | ICD-10-CM | POA: Diagnosis not present

## 2015-02-14 DIAGNOSIS — N182 Chronic kidney disease, stage 2 (mild): Secondary | ICD-10-CM | POA: Diagnosis not present

## 2015-02-14 DIAGNOSIS — M545 Low back pain: Secondary | ICD-10-CM | POA: Diagnosis not present

## 2015-02-14 DIAGNOSIS — I2510 Atherosclerotic heart disease of native coronary artery without angina pectoris: Secondary | ICD-10-CM | POA: Diagnosis not present

## 2015-02-14 DIAGNOSIS — E119 Type 2 diabetes mellitus without complications: Secondary | ICD-10-CM | POA: Diagnosis not present

## 2015-02-18 DIAGNOSIS — F0390 Unspecified dementia without behavioral disturbance: Secondary | ICD-10-CM | POA: Diagnosis not present

## 2015-02-18 DIAGNOSIS — M545 Low back pain: Secondary | ICD-10-CM | POA: Diagnosis not present

## 2015-02-18 DIAGNOSIS — E119 Type 2 diabetes mellitus without complications: Secondary | ICD-10-CM | POA: Diagnosis not present

## 2015-02-18 DIAGNOSIS — N182 Chronic kidney disease, stage 2 (mild): Secondary | ICD-10-CM | POA: Diagnosis not present

## 2015-02-18 DIAGNOSIS — I2510 Atherosclerotic heart disease of native coronary artery without angina pectoris: Secondary | ICD-10-CM | POA: Diagnosis not present

## 2015-02-18 DIAGNOSIS — I129 Hypertensive chronic kidney disease with stage 1 through stage 4 chronic kidney disease, or unspecified chronic kidney disease: Secondary | ICD-10-CM | POA: Diagnosis not present

## 2015-02-20 DIAGNOSIS — M545 Low back pain: Secondary | ICD-10-CM | POA: Diagnosis not present

## 2015-02-20 DIAGNOSIS — I2510 Atherosclerotic heart disease of native coronary artery without angina pectoris: Secondary | ICD-10-CM | POA: Diagnosis not present

## 2015-02-20 DIAGNOSIS — N182 Chronic kidney disease, stage 2 (mild): Secondary | ICD-10-CM | POA: Diagnosis not present

## 2015-02-20 DIAGNOSIS — E119 Type 2 diabetes mellitus without complications: Secondary | ICD-10-CM | POA: Diagnosis not present

## 2015-02-20 DIAGNOSIS — I129 Hypertensive chronic kidney disease with stage 1 through stage 4 chronic kidney disease, or unspecified chronic kidney disease: Secondary | ICD-10-CM | POA: Diagnosis not present

## 2015-02-20 DIAGNOSIS — F0390 Unspecified dementia without behavioral disturbance: Secondary | ICD-10-CM | POA: Diagnosis not present

## 2015-02-25 ENCOUNTER — Emergency Department: Payer: Medicare Other

## 2015-02-25 ENCOUNTER — Encounter: Payer: Self-pay | Admitting: Emergency Medicine

## 2015-02-25 ENCOUNTER — Emergency Department
Admission: EM | Admit: 2015-02-25 | Discharge: 2015-02-25 | Disposition: A | Discharge status: Home / Self Care | Payer: Medicare Other | Attending: Student | Admitting: Student

## 2015-02-25 DIAGNOSIS — N182 Chronic kidney disease, stage 2 (mild): Secondary | ICD-10-CM | POA: Diagnosis not present

## 2015-02-25 DIAGNOSIS — F0390 Unspecified dementia without behavioral disturbance: Secondary | ICD-10-CM | POA: Diagnosis not present

## 2015-02-25 DIAGNOSIS — R05 Cough: Secondary | ICD-10-CM | POA: Diagnosis not present

## 2015-02-25 DIAGNOSIS — E119 Type 2 diabetes mellitus without complications: Secondary | ICD-10-CM | POA: Diagnosis not present

## 2015-02-25 DIAGNOSIS — E1165 Type 2 diabetes mellitus with hyperglycemia: Secondary | ICD-10-CM | POA: Diagnosis not present

## 2015-02-25 DIAGNOSIS — I10 Essential (primary) hypertension: Secondary | ICD-10-CM | POA: Insufficient documentation

## 2015-02-25 DIAGNOSIS — Z79899 Other long term (current) drug therapy: Secondary | ICD-10-CM | POA: Insufficient documentation

## 2015-02-25 DIAGNOSIS — I129 Hypertensive chronic kidney disease with stage 1 through stage 4 chronic kidney disease, or unspecified chronic kidney disease: Secondary | ICD-10-CM | POA: Diagnosis not present

## 2015-02-25 DIAGNOSIS — M545 Low back pain: Secondary | ICD-10-CM | POA: Diagnosis not present

## 2015-02-25 DIAGNOSIS — I2510 Atherosclerotic heart disease of native coronary artery without angina pectoris: Secondary | ICD-10-CM | POA: Diagnosis not present

## 2015-02-25 HISTORY — DX: Unspecified osteoarthritis, unspecified site: M19.90

## 2015-02-25 HISTORY — DX: Disorder of kidney and ureter, unspecified: N28.9

## 2015-02-25 HISTORY — DX: Unspecified dementia, unspecified severity, without behavioral disturbance, psychotic disturbance, mood disturbance, and anxiety: F03.90

## 2015-02-25 HISTORY — DX: Essential (primary) hypertension: I10

## 2015-02-25 HISTORY — DX: Type 2 diabetes mellitus without complications: E11.9

## 2015-02-25 LAB — CBC
HEMATOCRIT: 39.6 % — AB (ref 40.0–52.0)
HEMOGLOBIN: 13 g/dL (ref 13.0–18.0)
MCH: 25.5 pg — AB (ref 26.0–34.0)
MCHC: 32.9 g/dL (ref 32.0–36.0)
MCV: 77.6 fL — ABNORMAL LOW (ref 80.0–100.0)
Platelets: 210 10*3/uL (ref 150–440)
RBC: 5.1 MIL/uL (ref 4.40–5.90)
RDW: 15.1 % — AB (ref 11.5–14.5)
WBC: 7.7 10*3/uL (ref 3.8–10.6)

## 2015-02-25 LAB — BASIC METABOLIC PANEL
Anion gap: 9 (ref 5–15)
BUN: 23 mg/dL — ABNORMAL HIGH (ref 6–20)
CHLORIDE: 97 mmol/L — AB (ref 101–111)
CO2: 25 mmol/L (ref 22–32)
CREATININE: 1.57 mg/dL — AB (ref 0.61–1.24)
Calcium: 9.4 mg/dL (ref 8.9–10.3)
GFR calc Af Amer: 45 mL/min — ABNORMAL LOW (ref >60)
GFR, EST NON AFRICAN AMERICAN: 39 mL/min — AB (ref >60)
Glucose, Bld: 495 mg/dL — ABNORMAL HIGH (ref 65–99)
Potassium: 4.5 mmol/L (ref 3.5–5.1)
Sodium: 131 mmol/L — ABNORMAL LOW (ref 135–145)

## 2015-02-25 LAB — URINALYSIS COMPLETE WITH MICROSCOPIC (ARMC ONLY)
BILIRUBIN URINE: NEGATIVE
Glucose, UA: >500 mg/dL — AB
HGB URINE DIPSTICK: NEGATIVE
KETONES UR: NEGATIVE mg/dL
LEUKOCYTES UA: NEGATIVE
NITRITE: NEGATIVE
Protein, ur: NEGATIVE mg/dL
RBC / HPF: NONE SEEN RBC/hpf (ref 0–5)
SPECIFIC GRAVITY, URINE: 1.014 (ref 1.005–1.030)
SQUAMOUS EPITHELIAL / LPF: NONE SEEN
pH: 5 (ref 5.0–8.0)

## 2015-02-25 LAB — GLUCOSE, CAPILLARY
Glucose-Capillary: 454 mg/dL — ABNORMAL HIGH (ref 65–99)
Glucose-Capillary: 364 mg/dL — ABNORMAL HIGH (ref 65–99)
Glucose-Capillary: 302 mg/dL — ABNORMAL HIGH (ref 65–99)
Glucose-Capillary: 304 mg/dL — ABNORMAL HIGH (ref 65–99)

## 2015-02-25 MED ORDER — NICOTINE 10 MG IN INHA
RESPIRATORY_TRACT | Status: AC
Start: 2015-02-25 — End: 2015-02-26
  Filled 2015-02-25: qty 36

## 2015-02-25 MED ORDER — INSULIN ASPART 100 UNIT/ML ~~LOC~~ SOLN
SUBCUTANEOUS | Status: AC
  Filled 2015-02-25: qty 1

## 2015-02-25 MED ORDER — HYDROXYZINE HCL 25 MG PO TABS
ORAL_TABLET | ORAL | Status: AC
  Filled 2015-02-25: qty 2

## 2015-02-25 MED ORDER — INSULIN ASPART 100 UNIT/ML ~~LOC~~ SOLN
8.0000 [IU] | Freq: Once | SUBCUTANEOUS | Status: AC
  Administered 2015-02-25: 8 [IU] via SUBCUTANEOUS
  Filled 2015-02-25: qty 1

## 2015-02-25 MED ORDER — PREGABALIN 50 MG PO CAPS
ORAL_CAPSULE | ORAL | Status: AC
  Filled 2015-02-25: qty 3

## 2015-02-25 MED ORDER — SODIUM CHLORIDE 0.9 % IV BOLUS (SEPSIS)
500.0000 mL | Freq: Once | INTRAVENOUS | Status: AC
  Administered 2015-02-25: 500 mL via INTRAVENOUS

## 2015-02-25 NOTE — ED Notes (Signed)
Given water. Ok by MD.

## 2015-02-25 NOTE — ED Notes (Signed)
Pt is seen by home Rn, past few days, blood glucose has been over 500, MD told pt to come to ER, denies any pain or complaints.

## 2015-02-25 NOTE — ED Notes (Signed)
MD at bedside. 

## 2015-02-25 NOTE — ED Notes (Signed)
Family with pt.  Iv dc'dc.  D/c inst to pt and family

## 2015-02-25 NOTE — ED Notes (Addendum)
Home health nurse has come by and blood sugars have been elevated last couple days and pt was told to come to ED.  Denies feeling bad.  Denies increased thirst or urination.  Patient has been more fatigued over past couple weeks per daughter.

## 2015-02-25 NOTE — ED Notes (Signed)
Resumed care from valerie rn.  Pt alert.

## 2015-02-25 NOTE — ED Notes (Signed)
Patient transported to X-ray 

## 2015-02-25 NOTE — ED Provider Notes (Signed)
[facility] Emergency Department Provider Note  ____________________________________________  Time seen: Approximately 1:51 PM  I have reviewed the triage vital signs and the nursing notes.   HISTORY  Chief Complaint Hyperglycemia    HPI Jeanluc Wegman is a 81 y.o. male with history of dementia, type 2 diabetes, arthritis who presents for evaluation for hyperglycemia. The patient previously lived in a dementia facility but was concerned that "they were taking all my money" so he signed himself out. He now lives alone. He has a home health nurse that comes and checks on him every Tuesday and Thursday. Today the nurse recommended he come into the emergency department because for the past few days his glucose has been greater than 500. He denies any chest pain or difficulty breathing no abdominal pain, vomiting, diarrhea, fevers or chills. He reports that he has been compliant with his medications, he says he has been giving himself insulin nightly. Currently he is asymptomatic. No modifying factors. He has had mild cough but no other associated symptoms.   Past Medical History  Diagnosis Date  . Arthritis   . Diabetes mellitus without complication   . Dementia   . Renal disorder   . Hypertension     There are no active problems to display for this patient.   Past Surgical History  Procedure Laterality Date  . Cholecystectomy    . Back surgery    . Knee surgery    . Hernia repair      Current Outpatient Rx  Name  Route  Sig  Dispense  Refill  . amLODipine (NORVASC) 10 MG tablet   Oral   Take 10 mg by mouth daily.         [name] atorvastatin (LIPITOR) 10 MG tablet   Oral   Take 5 mg by mouth daily.         . bumetanide (BUMEX) 0.5 MG tablet   Oral   Take 0.5 mg by mouth daily.         . canagliflozin (INVOKANA) 100 MG TABS tablet   Oral   Take 100 mg by mouth daily.         . Hypromellose (NATURAL BALANCE TEARS) 0.4 % SOLN   Ophthalmic  Apply 1 drop to eye every 6 (six) hours as needed (for dry eyes).         . Insulin Glargine (TOUJEO SOLOSTAR) 300 UNIT/ML SOPN   Subcutaneous   Inject 60 Units into the skin daily.         [name] losartan (COZAAR) 25 MG tablet   Oral   Take 25 mg by mouth daily.         . memantine (NAMENDA XR) 28 MG CP24 24 hr capsule   Oral   Take 28 mg by mouth daily.           Allergies Oxycodone  No family history on file.  Social History History  Substance Use Topics  . Smoking status: Never Smoker   . Smokeless tobacco: Not on file  . Alcohol Use: No    Review of Systems Constitutional: No fever/chills Eyes: No visual changes. ENT: No sore throat. Cardiovascular: Denies chest pain. Respiratory: Denies shortness of breath. Gastrointestinal: No abdominal pain.  No nausea, no vomiting.  No diarrhea.  No constipation. Genitourinary: Negative for dysuria. Musculoskeletal: Negative for back pain. Skin: Negative for rash. Neurological: Negative for headaches, focal weakness or numbness.  10-point ROS otherwise negative.  ____________________________________________   PHYSICAL EXAM:  VITAL SIGNS: ED Triage Vitals  Enc Vitals Group     BP 02/25/15 1306 106/49 mmHg     Pulse Rate 02/25/15 1306 85     Resp 02/25/15 1306 18     Temp 02/25/15 1306 98.6 F (37 C)     Temp Source 02/25/15 1306 Oral     SpO2 02/25/15 1309 97 %     Weight 02/25/15 1309 278 lb (126.1 kg)     Height 02/25/15 1309 5\' 9"  (1.753 m)     Head Cir --      Peak Flow --      Pain Score --      Pain Loc --      Pain Edu? --      Excl. in [place]? --     Constitutional: Alert and orientedx3. Well appearing and in no acute distress. Eyes: Conjunctivae are normal. PERRL. EOMI. Head: Atraumatic. Nose: No congestion/rhinnorhea. Mouth/Throat: Mucous membranes are moist.  Oropharynx non-erythematous. Neck: No stridor. Cardiovascular: Normal rate, regular rhythm. Grossly normal heart sounds.  Good  peripheral circulation. Respiratory: Normal respiratory effort.  No retractions. Lungs CTAB. Gastrointestinal: Soft and nontender. No distention. No abdominal bruits. No CVA tenderness. Genitourinary: deferred Musculoskeletal: No lower extremity tenderness nor edema.  No joint effusions. Old/healed scars in the midline of the L-spine with no associated tenderness. Neurologic:  Normal speech and language. No gross focal neurologic deficits are appreciated. No gait instability. Skin:  Skin is warm, dry and intact. No rash noted. Psychiatric: Mood and affect are normal. Speech and behavior are normal.  ____________________________________________   LABS (all labs ordered are listed, but only abnormal results are displayed)  Labs Reviewed  GLUCOSE, CAPILLARY - Abnormal; Notable for the following:    Glucose-Capillary 454 (*)    All other components within normal limits  BASIC METABOLIC PANEL - Abnormal; Notable for the following:    Sodium 131 (*)    Chloride 97 (*)    Glucose, Bld 495 (*)    BUN 23 (*)    Creatinine, Ser 1.57 (*)    GFR calc non Af Amer 39 (*)    GFR calc Af Amer 45 (*)    All other components within normal limits  CBC - Abnormal; Notable for the following:    HCT 39.6 (*)    MCV 77.6 (*)    MCH 25.5 (*)    RDW 15.1 (*)    All other components within normal limits  URINALYSIS COMPLETEWITH MICROSCOPIC ([facility] ONLY) - Abnormal; Notable for the following:    Color, Urine STRAW (*)    APPearance CLEAR (*)    Glucose, UA >500 (*)    Bacteria, UA RARE (*)    All other components within normal limits  GLUCOSE, CAPILLARY - Abnormal; Notable for the following:    Glucose-Capillary 364 (*)    All other components within normal limits  GLUCOSE, CAPILLARY - Abnormal; Notable for the following:    Glucose-Capillary 302 (*)    All other components within normal limits  GLUCOSE, CAPILLARY - Abnormal; Notable for the following:    Glucose-Capillary 304 (*)    All other  components within normal limits  CBG MONITORING, ED   ____________________________________________  EKG  none ____________________________________________  RADIOLOGY  CXR  FINDINGS: Chronic elevation of the LEFT hemidiaphragm with associated atelectasis and scarring at the LEFT base. Similar but less pronounced atelectasis is present at the RIGHT lung base. No airspace disease. The cardiopericardial silhouette appears within normal limits. Tortuous  thoracic aorta with arch atherosclerosis. Surgical clips in the LEFT axilla.  IMPRESSION: No acute cardiopulmonary disease. No interval change.   ____________________________________________   PROCEDURES  Procedure(s) performed: None  Critical Care performed: No  ____________________________________________   INITIAL IMPRESSION / ASSESSMENT AND PLAN / ED COURSE  Pertinent labs & imaging results that were available during my care of the patient were reviewed by me and considered in my medical decision making (see chart for details).   Jaylyn Booher is a 81 y.o. male with history of dementia, type 2 diabetes, arthritis who presents for evaluation for asymptomatic hyperglycemia. I suspect he has been noncompliant with his medications as he ports to me that he often checks his blood glucose however in the book where he logs his blood sugars, there have been no entries since 02/17/2015. Currently he appears well,  Has no complaints, is alert and oriented 3, has an intact neurological exam, vital signs are stable and he is afebrile. Plan for screening labs, chest x-ray to rule out pneumonia given his cough, urinalysis, we'll treat his hyperglycemia. Anticipate discharge with close PCP follow-up.  ----------------------------------------- 5:20 PM on 02/25/2015 -----------------------------------------  Blood glucose has improved to 304 at this time with [place] insulin. Labs notable for pseudohyponatremia which corrects appropriately  for his degree of hyperglycemia on arrival. No evidence to suggest DKA. Chronic stable creatinine elevation at 1.57. O2 sat was noted to be 90% earlier today however currently it is 95% on room air and He has never required any supplement oxygen (though 95L/min was charted - this was actually his O2 sat at time of discharge). I discussed with his family at bedside that he needs to be monitored when taking medications as I suspect he is not giving himself his nightly insulin - they will attempt to provide greater supervision. He will follow up with his primary care doctor tomorrow. Return precautions discussed. DC home. Patient and family at bedside are comfortable with the discharge plan. ____________________________________________   FINAL CLINICAL IMPRESSION(S) / ED DIAGNOSES  Final diagnoses:  Hyperglycemia due to type 2 diabetes mellitus      Joanne Gavel, MD 02/25/15 1724

## 2015-02-25 NOTE — ED Notes (Signed)
Pt alert.  Family at bedside.  Iv in place.

## 2015-02-26 DIAGNOSIS — I2510 Atherosclerotic heart disease of native coronary artery without angina pectoris: Secondary | ICD-10-CM | POA: Diagnosis not present

## 2015-02-26 DIAGNOSIS — M545 Low back pain: Secondary | ICD-10-CM | POA: Diagnosis not present

## 2015-02-26 DIAGNOSIS — I129 Hypertensive chronic kidney disease with stage 1 through stage 4 chronic kidney disease, or unspecified chronic kidney disease: Secondary | ICD-10-CM | POA: Diagnosis not present

## 2015-02-26 DIAGNOSIS — E119 Type 2 diabetes mellitus without complications: Secondary | ICD-10-CM | POA: Diagnosis not present

## 2015-02-26 DIAGNOSIS — N182 Chronic kidney disease, stage 2 (mild): Secondary | ICD-10-CM | POA: Diagnosis not present

## 2015-02-26 DIAGNOSIS — F0390 Unspecified dementia without behavioral disturbance: Secondary | ICD-10-CM | POA: Diagnosis not present

## 2015-02-28 DIAGNOSIS — F0390 Unspecified dementia without behavioral disturbance: Secondary | ICD-10-CM | POA: Diagnosis not present

## 2015-02-28 DIAGNOSIS — E119 Type 2 diabetes mellitus without complications: Secondary | ICD-10-CM | POA: Diagnosis not present

## 2015-02-28 DIAGNOSIS — M545 Low back pain: Secondary | ICD-10-CM | POA: Diagnosis not present

## 2015-02-28 DIAGNOSIS — N182 Chronic kidney disease, stage 2 (mild): Secondary | ICD-10-CM | POA: Diagnosis not present

## 2015-02-28 DIAGNOSIS — I129 Hypertensive chronic kidney disease with stage 1 through stage 4 chronic kidney disease, or unspecified chronic kidney disease: Secondary | ICD-10-CM | POA: Diagnosis not present

## 2015-02-28 DIAGNOSIS — I2510 Atherosclerotic heart disease of native coronary artery without angina pectoris: Secondary | ICD-10-CM | POA: Diagnosis not present

## 2015-03-02 ENCOUNTER — Encounter: Payer: Self-pay | Admitting: Emergency Medicine

## 2015-03-02 DIAGNOSIS — I10 Essential (primary) hypertension: Secondary | ICD-10-CM | POA: Diagnosis not present

## 2015-03-02 DIAGNOSIS — E1165 Type 2 diabetes mellitus with hyperglycemia: Secondary | ICD-10-CM | POA: Insufficient documentation

## 2015-03-02 DIAGNOSIS — Z794 Long term (current) use of insulin: Secondary | ICD-10-CM | POA: Diagnosis not present

## 2015-03-02 DIAGNOSIS — Z79899 Other long term (current) drug therapy: Secondary | ICD-10-CM | POA: Diagnosis not present

## 2015-03-02 LAB — GLUCOSE, CAPILLARY: GLUCOSE-CAPILLARY: 380 mg/dL — AB (ref 65–99)

## 2015-03-02 MED ORDER — SODIUM CHLORIDE 0.9 % IV BOLUS (SEPSIS)
500.0000 mL | Freq: Once | INTRAVENOUS | Status: AC
  Administered 2015-03-02: 500 mL via INTRAVENOUS

## 2015-03-02 NOTE — ED Notes (Signed)
Patient states that his blood sugar has been running high for a couple of days. Patient reports [id number] at home tonight.

## 2015-03-03 ENCOUNTER — Emergency Department
Admission: EM | Admit: 2015-03-03 | Discharge: 2015-03-03 | Disposition: A | Discharge status: Home / Self Care | Payer: Medicare Other | Attending: Emergency Medicine | Admitting: Emergency Medicine

## 2015-03-03 DIAGNOSIS — N183 Chronic kidney disease, stage 3 (moderate): Secondary | ICD-10-CM | POA: Diagnosis not present

## 2015-03-03 DIAGNOSIS — E1122 Type 2 diabetes mellitus with diabetic chronic kidney disease: Secondary | ICD-10-CM | POA: Diagnosis not present

## 2015-03-03 DIAGNOSIS — E1165 Type 2 diabetes mellitus with hyperglycemia: Secondary | ICD-10-CM | POA: Diagnosis not present

## 2015-03-03 DIAGNOSIS — R739 Hyperglycemia, unspecified: Secondary | ICD-10-CM

## 2015-03-03 DIAGNOSIS — Z794 Long term (current) use of insulin: Secondary | ICD-10-CM | POA: Diagnosis not present

## 2015-03-03 DIAGNOSIS — N08 Glomerular disorders in diseases classified elsewhere: Secondary | ICD-10-CM | POA: Diagnosis not present

## 2015-03-03 LAB — CBC WITH DIFFERENTIAL/PLATELET
Basophils Absolute: 0.1 10*3/uL (ref 0–0.1)
Basophils Relative: 1 %
Eosinophils Absolute: 0.1 10*3/uL (ref 0–0.7)
Eosinophils Relative: 2 %
HCT: 39.7 % — ABNORMAL LOW (ref 40.0–52.0)
Hemoglobin: 13.2 g/dL (ref 13.0–18.0)
LYMPHS PCT: 35 %
Lymphs Abs: 2.6 10*3/uL (ref 1.0–3.6)
MCH: 25.6 pg — ABNORMAL LOW (ref 26.0–34.0)
MCHC: 33.2 g/dL (ref 32.0–36.0)
MCV: 77 fL — AB (ref 80.0–100.0)
Monocytes Absolute: 0.9 10*3/uL (ref 0.2–1.0)
Monocytes Relative: 12 %
NEUTROS ABS: 3.7 10*3/uL (ref 1.4–6.5)
Neutrophils Relative %: 50 %
PLATELETS: 219 10*3/uL (ref 150–440)
RBC: 5.15 MIL/uL (ref 4.40–5.90)
RDW: 15.1 % — AB (ref 11.5–14.5)
WBC: 7.5 10*3/uL (ref 3.8–10.6)

## 2015-03-03 LAB — COMPREHENSIVE METABOLIC PANEL
ALT: 26 U/L (ref 17–63)
AST: 25 U/L (ref 15–41)
Albumin: 4 g/dL (ref 3.5–5.0)
Alkaline Phosphatase: 103 U/L (ref 38–126)
Anion gap: 10 (ref 5–15)
BILIRUBIN TOTAL: 0.5 mg/dL (ref 0.3–1.2)
BUN: 22 mg/dL — AB (ref 6–20)
CO2: 25 mmol/L (ref 22–32)
Calcium: 9.3 mg/dL (ref 8.9–10.3)
Chloride: 97 mmol/L — ABNORMAL LOW (ref 101–111)
Creatinine, Ser: 1.52 mg/dL — ABNORMAL HIGH (ref 0.61–1.24)
GFR calc Af Amer: 46 mL/min — ABNORMAL LOW (ref >60)
GFR, EST NON AFRICAN AMERICAN: 40 mL/min — AB (ref >60)
Glucose, Bld: 439 mg/dL — ABNORMAL HIGH (ref 65–99)
Potassium: 4 mmol/L (ref 3.5–5.1)
Sodium: 132 mmol/L — ABNORMAL LOW (ref 135–145)
Total Protein: 8.9 g/dL — ABNORMAL HIGH (ref 6.5–8.1)

## 2015-03-03 LAB — GLUCOSE, CAPILLARY: Glucose-Capillary: 310 mg/dL — ABNORMAL HIGH (ref 65–99)

## 2015-03-03 NOTE — Discharge Instructions (Signed)
Please seek medical attention for any high fevers, chest pain, shortness of breath, change in behavior, persistent vomiting, bloody stool or any other new or concerning symptoms. ° °Hyperglycemia °Hyperglycemia occurs when the glucose (sugar) in your blood is too high. Hyperglycemia can happen for many reasons, but it most often happens to people who do not know they have diabetes or are not managing their diabetes properly.  °CAUSES  °Whether you have diabetes or not, there are other causes of hyperglycemia. Hyperglycemia can occur when you have diabetes, but it can also occur in other situations that you might not be as aware of, such as: °Diabetes °· If you have diabetes and are having problems controlling your blood glucose, hyperglycemia could occur because of some of the following reasons: °¨ Not following your meal plan. °¨ Not taking your diabetes medications or not taking it properly. °¨ Exercising less or doing less activity than you normally do. °¨ Being sick. °Pre-diabetes °· This cannot be ignored. Before people develop Type 2 diabetes, they almost always have "pre-diabetes." This is when your blood glucose levels are higher than normal, but not yet high enough to be diagnosed as diabetes. Research has shown that some long-term damage to the body, especially the heart and circulatory system, may already be occurring during pre-diabetes. If you take action to manage your blood glucose when you have pre-diabetes, you may delay or prevent Type 2 diabetes from developing. °Stress °· If you have diabetes, you may be "diet" controlled or on oral medications or insulin to control your diabetes. However, you may find that your blood glucose is higher than usual in the hospital whether you have diabetes or not. This is often referred to as "stress hyperglycemia." Stress can elevate your blood glucose. This happens because of hormones put out by the body during times of stress. If stress has been the cause of  your high blood glucose, it can be followed regularly by your caregiver. That way he/she can make sure your hyperglycemia does not continue to get worse or progress to diabetes. °Steroids °· Steroids are medications that act on the infection fighting system (immune system) to block inflammation or infection. One side effect can be a rise in blood glucose. Most people can produce enough extra insulin to allow for this rise, but for those who cannot, steroids make blood glucose levels go even higher. It is not unusual for steroid treatments to "uncover" diabetes that is developing. It is not always possible to determine if the hyperglycemia will go away after the steroids are stopped. A special blood test called an A1c is sometimes done to determine if your blood glucose was elevated before the steroids were started. °SYMPTOMS °· Thirsty. °· Frequent urination. °· Dry mouth. °· Blurred vision. °· Tired or fatigue. °· Weakness. °· Sleepy. °· Tingling in feet or leg. °DIAGNOSIS  °Diagnosis is made by monitoring blood glucose in one or all of the following ways: °· A1c test. This is a chemical found in your blood. °· Fingerstick blood glucose monitoring. °· Laboratory results. °TREATMENT  °First, knowing the cause of the hyperglycemia is important before the hyperglycemia can be treated. Treatment may include, but is not be limited to: °· Education. °· Change or adjustment in medications. °· Change or adjustment in meal plan. °· Treatment for an illness, infection, etc. °· More frequent blood glucose monitoring. °· Change in exercise plan. °· Decreasing or stopping steroids. °· Lifestyle changes. °HOME CARE INSTRUCTIONS  °· Test your blood glucose   as directed. °· Exercise regularly. Your caregiver will give you instructions about exercise. Pre-diabetes or diabetes which comes on with stress is helped by exercising. °· Eat wholesome, balanced meals. Eat often and at regular, fixed times. Your caregiver or nutritionist  will give you a meal plan to guide your sugar intake. °· Being at an ideal weight is important. If needed, losing as little as 10 to 15 pounds may help improve blood glucose levels. °SEEK MEDICAL CARE IF:  °· You have questions about medicine, activity, or diet. °· You continue to have symptoms (problems such as increased thirst, urination, or weight gain). °SEEK IMMEDIATE MEDICAL CARE IF:  °· You are vomiting or have diarrhea. °· Your breath smells fruity. °· You are breathing faster or slower. °· You are very sleepy or incoherent. °· You have numbness, tingling, or pain in your feet or hands. °· You have chest pain. °· Your symptoms get worse even though you have been following your caregiver's orders. °· If you have any other questions or concerns. °Document Released: 01/12/2001 Document Revised: 10/11/2011 Document Reviewed: 11/15/2011 °ExitCare® Patient Information ©2015 [employer]. This information is not intended to replace advice given to you by your health care provider. Make sure you discuss any questions you have with your health care provider. ° °

## 2015-03-03 NOTE — ED Provider Notes (Signed)
[facility] Emergency Department Provider Note  ____________________________________________  Time seen: 0325  I have reviewed the triage vital signs and the nursing notes.   HISTORY  Chief Complaint Hyperglycemia   History limited by: Not Limited   HPI Daniel [name] is a 81 y.o. male who presents to the emergency department today because of concern for elevated blood sugars. He states that the blood sugars have been hard to control for the past roughly week. He denies any medical noncompliance. He was seen in the emergency department a few days ago for the same issue. He denies any chest pain, shortness of breath, nausea, vomiting, abdominal pain.     Past Medical History  Diagnosis Date  . Arthritis   . Diabetes mellitus without complication   . Dementia   . Renal disorder   . Hypertension     There are no active problems to display for this patient.   Past Surgical History  Procedure Laterality Date  . Cholecystectomy    . Back surgery    . Knee surgery    . Hernia repair      Current Outpatient Rx  Name  Route  Sig  Dispense  Refill  . amLODipine (NORVASC) 10 MG tablet   Oral   Take 10 mg by mouth daily.         [name] atorvastatin (LIPITOR) 10 MG tablet   Oral   Take 5 mg by mouth daily.         . bumetanide (BUMEX) 0.5 MG tablet   Oral   Take 0.5 mg by mouth daily.         . canagliflozin (INVOKANA) 100 MG TABS tablet   Oral   Take 100 mg by mouth daily.         . Hypromellose (NATURAL BALANCE TEARS) 0.4 % SOLN   Ophthalmic   Apply 1 drop to eye every 6 (six) hours as needed (for dry eyes).         . Insulin Glargine (TOUJEO SOLOSTAR) 300 UNIT/ML SOPN   Subcutaneous   Inject 60 Units into the skin daily.         [name] losartan (COZAAR) 25 MG tablet   Oral   Take 25 mg by mouth daily.         . memantine (NAMENDA XR) 28 MG CP24 24 hr capsule   Oral   Take 28 mg by mouth daily.            Allergies Oxycodone  History reviewed. No pertinent family history.  Social History History  Substance Use Topics  . Smoking status: Never Smoker   . Smokeless tobacco: Not on file  . Alcohol Use: No    Review of Systems  Constitutional: Negative for fever. Cardiovascular: Negative for chest pain. Respiratory: Negative for shortness of breath. Gastrointestinal: Negative for abdominal pain, vomiting and diarrhea. Genitourinary: Negative for dysuria. Musculoskeletal: Negative for back pain. Skin: Negative for rash. Neurological: Negative for headaches, focal weakness or numbness.   10-point ROS otherwise negative.  ____________________________________________   PHYSICAL EXAM:  VITAL SIGNS: ED Triage Vitals  Enc Vitals Group     BP 03/02/15 2344 150/60 mmHg     Pulse Rate 03/02/15 2344 87     Resp 03/02/15 2344 18     Temp 03/02/15 2344 98.3 F (36.8 C)     Temp Source 03/02/15 2344 Oral     SpO2 03/02/15 2344 96 %     Weight 03/02/15 2344  278 lb (126.1 kg)     Height 03/02/15 2344 5\' 9"  (1.753 m)   Constitutional: Alert and oriented. Well appearing and in no distress. Eyes: Conjunctivae are normal. PERRL. Normal extraocular movements. ENT   Head: Normocephalic and atraumatic.   Nose: No congestion/rhinnorhea.   Mouth/Throat: Mucous membranes are moist.   Neck: No stridor. Hematological/Lymphatic/Immunilogical: No cervical lymphadenopathy. Cardiovascular: Normal rate, regular rhythm.  No murmurs, rubs, or gallops. Respiratory: Normal respiratory effort without tachypnea nor retractions. Breath sounds are clear and equal bilaterally. No wheezes/rales/rhonchi. Gastrointestinal: Soft and nontender. No distention.  Genitourinary: Deferred Musculoskeletal: Normal range of motion in all extremities. No joint effusions.  No lower extremity tenderness nor edema. Neurologic:  Normal speech and language. No gross focal neurologic deficits are  appreciated. Speech is normal.  Skin:  Skin is warm, dry and intact. No rash noted. Psychiatric: Mood and affect are normal. Speech and behavior are normal. Patient exhibits appropriate insight and judgment.  ____________________________________________    LABS (pertinent positives/negatives)  Labs Reviewed  GLUCOSE, CAPILLARY - Abnormal; Notable for the following:    Glucose-Capillary 380 (*)    All other components within normal limits  CBC WITH DIFFERENTIAL/PLATELET - Abnormal; Notable for the following:    HCT 39.7 (*)    MCV 77.0 (*)    MCH 25.6 (*)    RDW 15.1 (*)    All other components within normal limits  COMPREHENSIVE METABOLIC PANEL - Abnormal; Notable for the following:    Sodium 132 (*)    Chloride 97 (*)    Glucose, Bld 439 (*)    BUN 22 (*)    Creatinine, Ser 1.52 (*)    Total Protein 8.9 (*)    GFR calc non Af Amer 40 (*)    GFR calc Af Amer 46 (*)    All other components within normal limits    ____________________________________________   EKG  None  ____________________________________________    RADIOLOGY  None  ____________________________________________   PROCEDURES  Procedure(s) performed: None  Critical Care performed: No  ____________________________________________   INITIAL IMPRESSION / ASSESSMENT AND PLAN / ED COURSE  Pertinent labs & imaging results that were available during my care of the patient were reviewed by me and considered in my medical decision making (see chart for details).  Patient presents to the emergency department because of concerns of heart control blood sugars. Blood work does show elevated glucose over no other signs of DKA. I discussed with patient importance of following up with primary care doctor to get better control of his sugars. At this point I doubt any infectious etiology of elevated sugars.  ____________________________________________   FINAL CLINICAL IMPRESSION(S) / ED  DIAGNOSES  Final diagnoses:  Hyperglycemia     Nance Pear, MD 03/03/15 [phone]

## 2015-03-04 DIAGNOSIS — I129 Hypertensive chronic kidney disease with stage 1 through stage 4 chronic kidney disease, or unspecified chronic kidney disease: Secondary | ICD-10-CM | POA: Diagnosis not present

## 2015-03-04 DIAGNOSIS — E119 Type 2 diabetes mellitus without complications: Secondary | ICD-10-CM | POA: Diagnosis not present

## 2015-03-04 DIAGNOSIS — N182 Chronic kidney disease, stage 2 (mild): Secondary | ICD-10-CM | POA: Diagnosis not present

## 2015-03-04 DIAGNOSIS — F0390 Unspecified dementia without behavioral disturbance: Secondary | ICD-10-CM | POA: Diagnosis not present

## 2015-03-04 DIAGNOSIS — I2510 Atherosclerotic heart disease of native coronary artery without angina pectoris: Secondary | ICD-10-CM | POA: Diagnosis not present

## 2015-03-04 DIAGNOSIS — M545 Low back pain: Secondary | ICD-10-CM | POA: Diagnosis not present

## 2015-03-06 DIAGNOSIS — F0390 Unspecified dementia without behavioral disturbance: Secondary | ICD-10-CM | POA: Diagnosis not present

## 2015-03-06 DIAGNOSIS — M545 Low back pain: Secondary | ICD-10-CM | POA: Diagnosis not present

## 2015-03-06 DIAGNOSIS — E119 Type 2 diabetes mellitus without complications: Secondary | ICD-10-CM | POA: Diagnosis not present

## 2015-03-06 DIAGNOSIS — I2510 Atherosclerotic heart disease of native coronary artery without angina pectoris: Secondary | ICD-10-CM | POA: Diagnosis not present

## 2015-03-06 DIAGNOSIS — I129 Hypertensive chronic kidney disease with stage 1 through stage 4 chronic kidney disease, or unspecified chronic kidney disease: Secondary | ICD-10-CM | POA: Diagnosis not present

## 2015-03-06 DIAGNOSIS — N182 Chronic kidney disease, stage 2 (mild): Secondary | ICD-10-CM | POA: Diagnosis not present

## 2015-03-11 DIAGNOSIS — M545 Low back pain: Secondary | ICD-10-CM | POA: Diagnosis not present

## 2015-03-11 DIAGNOSIS — I129 Hypertensive chronic kidney disease with stage 1 through stage 4 chronic kidney disease, or unspecified chronic kidney disease: Secondary | ICD-10-CM | POA: Diagnosis not present

## 2015-03-11 DIAGNOSIS — N182 Chronic kidney disease, stage 2 (mild): Secondary | ICD-10-CM | POA: Diagnosis not present

## 2015-03-11 DIAGNOSIS — I2510 Atherosclerotic heart disease of native coronary artery without angina pectoris: Secondary | ICD-10-CM | POA: Diagnosis not present

## 2015-03-11 DIAGNOSIS — E119 Type 2 diabetes mellitus without complications: Secondary | ICD-10-CM | POA: Diagnosis not present

## 2015-03-11 DIAGNOSIS — F0390 Unspecified dementia without behavioral disturbance: Secondary | ICD-10-CM | POA: Diagnosis not present

## 2015-03-17 DIAGNOSIS — N182 Chronic kidney disease, stage 2 (mild): Secondary | ICD-10-CM | POA: Diagnosis not present

## 2015-03-17 DIAGNOSIS — I2510 Atherosclerotic heart disease of native coronary artery without angina pectoris: Secondary | ICD-10-CM | POA: Diagnosis not present

## 2015-03-17 DIAGNOSIS — E119 Type 2 diabetes mellitus without complications: Secondary | ICD-10-CM | POA: Diagnosis not present

## 2015-03-17 DIAGNOSIS — I129 Hypertensive chronic kidney disease with stage 1 through stage 4 chronic kidney disease, or unspecified chronic kidney disease: Secondary | ICD-10-CM | POA: Diagnosis not present

## 2015-03-17 DIAGNOSIS — F0390 Unspecified dementia without behavioral disturbance: Secondary | ICD-10-CM | POA: Diagnosis not present

## 2015-03-17 DIAGNOSIS — M545 Low back pain: Secondary | ICD-10-CM | POA: Diagnosis not present

## 2015-03-18 DIAGNOSIS — N182 Chronic kidney disease, stage 2 (mild): Secondary | ICD-10-CM | POA: Diagnosis not present

## 2015-03-18 DIAGNOSIS — E119 Type 2 diabetes mellitus without complications: Secondary | ICD-10-CM | POA: Diagnosis not present

## 2015-03-18 DIAGNOSIS — I2510 Atherosclerotic heart disease of native coronary artery without angina pectoris: Secondary | ICD-10-CM | POA: Diagnosis not present

## 2015-03-18 DIAGNOSIS — M545 Low back pain: Secondary | ICD-10-CM | POA: Diagnosis not present

## 2015-03-18 DIAGNOSIS — F0390 Unspecified dementia without behavioral disturbance: Secondary | ICD-10-CM | POA: Diagnosis not present

## 2015-03-18 DIAGNOSIS — I129 Hypertensive chronic kidney disease with stage 1 through stage 4 chronic kidney disease, or unspecified chronic kidney disease: Secondary | ICD-10-CM | POA: Diagnosis not present

## 2015-03-21 DIAGNOSIS — I129 Hypertensive chronic kidney disease with stage 1 through stage 4 chronic kidney disease, or unspecified chronic kidney disease: Secondary | ICD-10-CM | POA: Diagnosis not present

## 2015-03-21 DIAGNOSIS — M545 Low back pain: Secondary | ICD-10-CM | POA: Diagnosis not present

## 2015-03-21 DIAGNOSIS — I2510 Atherosclerotic heart disease of native coronary artery without angina pectoris: Secondary | ICD-10-CM | POA: Diagnosis not present

## 2015-03-21 DIAGNOSIS — E119 Type 2 diabetes mellitus without complications: Secondary | ICD-10-CM | POA: Diagnosis not present

## 2015-03-21 DIAGNOSIS — N182 Chronic kidney disease, stage 2 (mild): Secondary | ICD-10-CM | POA: Diagnosis not present

## 2015-03-21 DIAGNOSIS — F0390 Unspecified dementia without behavioral disturbance: Secondary | ICD-10-CM | POA: Diagnosis not present

## 2015-03-25 DIAGNOSIS — I129 Hypertensive chronic kidney disease with stage 1 through stage 4 chronic kidney disease, or unspecified chronic kidney disease: Secondary | ICD-10-CM | POA: Diagnosis not present

## 2015-03-25 DIAGNOSIS — N182 Chronic kidney disease, stage 2 (mild): Secondary | ICD-10-CM | POA: Diagnosis not present

## 2015-03-25 DIAGNOSIS — I2510 Atherosclerotic heart disease of native coronary artery without angina pectoris: Secondary | ICD-10-CM | POA: Diagnosis not present

## 2015-03-25 DIAGNOSIS — M545 Low back pain: Secondary | ICD-10-CM | POA: Diagnosis not present

## 2015-03-25 DIAGNOSIS — F0390 Unspecified dementia without behavioral disturbance: Secondary | ICD-10-CM | POA: Diagnosis not present

## 2015-03-25 DIAGNOSIS — E119 Type 2 diabetes mellitus without complications: Secondary | ICD-10-CM | POA: Diagnosis not present

## 2015-03-28 DIAGNOSIS — E1151 Type 2 diabetes mellitus with diabetic peripheral angiopathy without gangrene: Secondary | ICD-10-CM | POA: Diagnosis not present

## 2015-03-28 DIAGNOSIS — N182 Chronic kidney disease, stage 2 (mild): Secondary | ICD-10-CM | POA: Diagnosis not present

## 2015-03-28 DIAGNOSIS — I129 Hypertensive chronic kidney disease with stage 1 through stage 4 chronic kidney disease, or unspecified chronic kidney disease: Secondary | ICD-10-CM | POA: Diagnosis not present

## 2015-03-28 DIAGNOSIS — L602 Onychogryphosis: Secondary | ICD-10-CM | POA: Diagnosis not present

## 2015-03-28 DIAGNOSIS — I2510 Atherosclerotic heart disease of native coronary artery without angina pectoris: Secondary | ICD-10-CM | POA: Diagnosis not present

## 2015-03-28 DIAGNOSIS — M545 Low back pain: Secondary | ICD-10-CM | POA: Diagnosis not present

## 2015-03-28 DIAGNOSIS — F0390 Unspecified dementia without behavioral disturbance: Secondary | ICD-10-CM | POA: Diagnosis not present

## 2015-03-28 DIAGNOSIS — E119 Type 2 diabetes mellitus without complications: Secondary | ICD-10-CM | POA: Diagnosis not present

## 2015-03-31 DIAGNOSIS — F0390 Unspecified dementia without behavioral disturbance: Secondary | ICD-10-CM | POA: Diagnosis not present

## 2015-03-31 DIAGNOSIS — E119 Type 2 diabetes mellitus without complications: Secondary | ICD-10-CM | POA: Diagnosis not present

## 2015-03-31 DIAGNOSIS — M545 Low back pain: Secondary | ICD-10-CM | POA: Diagnosis not present

## 2015-03-31 DIAGNOSIS — I129 Hypertensive chronic kidney disease with stage 1 through stage 4 chronic kidney disease, or unspecified chronic kidney disease: Secondary | ICD-10-CM | POA: Diagnosis not present

## 2015-03-31 DIAGNOSIS — I2510 Atherosclerotic heart disease of native coronary artery without angina pectoris: Secondary | ICD-10-CM | POA: Diagnosis not present

## 2015-03-31 DIAGNOSIS — N182 Chronic kidney disease, stage 2 (mild): Secondary | ICD-10-CM | POA: Diagnosis not present

## 2015-05-12 DIAGNOSIS — N183 Chronic kidney disease, stage 3 (moderate): Secondary | ICD-10-CM | POA: Diagnosis not present

## 2015-05-12 DIAGNOSIS — Z23 Encounter for immunization: Secondary | ICD-10-CM | POA: Diagnosis not present

## 2015-05-12 DIAGNOSIS — N08 Glomerular disorders in diseases classified elsewhere: Secondary | ICD-10-CM | POA: Diagnosis not present

## 2015-05-12 DIAGNOSIS — I129 Hypertensive chronic kidney disease with stage 1 through stage 4 chronic kidney disease, or unspecified chronic kidney disease: Secondary | ICD-10-CM | POA: Diagnosis not present

## 2015-05-12 DIAGNOSIS — E1122 Type 2 diabetes mellitus with diabetic chronic kidney disease: Secondary | ICD-10-CM | POA: Diagnosis not present

## 2015-05-21 NOTE — Patient Outreach (Signed)
[place] [facility]) Care Management  05/21/2015  Berwyn Bigley Jul 14, 1928 [id number]   Referral from MD, assigned to [name], RN for patient outreach.  Avagail Whittlesey L. Karne Ozga, [place] Care Management Assistant

## 2015-05-27 ENCOUNTER — Other Ambulatory Visit: Payer: Self-pay | Admitting: *Deleted

## 2015-05-27 DIAGNOSIS — E118 Type 2 diabetes mellitus with unspecified complications: Secondary | ICD-10-CM

## 2015-05-27 DIAGNOSIS — Z79899 Other long term (current) drug therapy: Secondary | ICD-10-CM

## 2015-05-27 DIAGNOSIS — Z9181 History of falling: Secondary | ICD-10-CM

## 2015-05-27 NOTE — Patient Outreach (Signed)
[place] [facility]) Care Management  05/27/2015  Daniel [name] [birth year] [id number]   MD referral: Telephone call to patient who is listed as contact person. HIPPA verification received from patient. Patient voices that he has recently moved. New address: [address] [place], [place] [address]; cell phone contact [phone].   Subjective: Patient voices that he does have diabetes and high blood pressure. States he owns meter and checks blood sugar once or twice daily. States this AM before breakfast blood sugar was 388. States he self administers insulin every evening. States if blood sugar drops he drinks orange juice. Patient states he takes other medicines but is unable to name except by color. Does not know if blood pressure is under control.  Unable to state reason he is taking other medicines. States unable to find all of pills which he usually keeps in a bag. States daughter gets his medications from local pharmacy & that he has no co pay.  States major concern is trouble walking and fear of falling (fell last year with injury that required surgery). States he has chronic back pain( back surgery in 1980's) and bad leg that may gives away on him at any time. States he is currently using rollator and cane as needed. States having no back pain today.  Patient gave phone to his daughter who has just arrived at this home. Spoke with daughter-Kim Klas who states she lives across the street and checks on him several times daily.  Daughter states patient is taking own medications and there is a possibility that he may not be taking appropriately. States he is forgetting things and talks to her about things that she does not understand at times..  Daughter was advised of [facility] care management services and she consents to services. Contact number for daughter-[phone].   Objective: See medication list noted in system. See health assessments as noted.  Assessment: Patient has  knowledge deficit regarding medications.  Patient is falls risk. Patient lives alone. Has fear of falling. Blood pressure not being monitored at home.   Plan: Refer care management assistant to refer to community care coordinator and pharmacist for noted reasons in complex case management for medication management and case/disease management.  Telephonic closing out of case.   [name], RN BSN [place] Management Coordinator [facility] Care Management  [phone]

## 2015-05-28 NOTE — Patient Outreach (Signed)
[name]) Care Management  05/28/2015  Daniel [name] [birth year] [id number]   Request from [name], RN to assign Community RN and Pharmacy, assigned [name], RN and [name], PharmD.  Thanks, [name]. [name], [name] Assistant Phone: [phone] Fax: [phone]

## 2015-06-02 ENCOUNTER — Other Ambulatory Visit: Payer: Self-pay | Admitting: Pharmacist

## 2015-06-02 NOTE — Patient Outreach (Signed)
Daniel [name] is a 81 y.o. referred to pharmacy for medication adherence. Per referral, called to speak with patient's daughter, [name] ([phone]), who is healthcare power of attorney. However, daughter does not answer and phone is not setup to accept voicemail at this time. Called and spoke with patient. Patient is agreeable to having me come out to meet with him and his daughter. Confirm daughter's phone number with patient and let him know that I will continue to try to reach out to her to schedule a visit.  Will call patient's daughter again within 1 week.  [name], PharmD Clinical Pharmacist [place] Management [phone]

## 2015-06-03 ENCOUNTER — Telehealth: Payer: Self-pay

## 2015-06-04 ENCOUNTER — Other Ambulatory Visit: Payer: Self-pay | Admitting: Pharmacist

## 2015-06-04 ENCOUNTER — Other Ambulatory Visit: Payer: Self-pay | Admitting: *Deleted

## 2015-06-04 DIAGNOSIS — Z599 Problem related to housing and economic circumstances, unspecified: Secondary | ICD-10-CM

## 2015-06-04 DIAGNOSIS — Z598 Other problems related to housing and economic circumstances: Secondary | ICD-10-CM

## 2015-06-04 NOTE — Patient Outreach (Signed)
RNCM spoke to daughter HPOA to set up initial home visit while daughter was speaking to pharmacist. Daughter expressed concerns that father was unsafe to live alone related to he has been unreliable in taking his medications, and even losing an entire bottle of medications. Daughter states he does not have medicaid at this time but was able to get it when he was in the nursing home before. Daughter requested to speak to a SW to inquire about her dad going into a nursing home for safety. RNCM made daughter aware she would refer her dad to Daniel [name].   Plan: Communication order for SW. Appointment for 06/09/15 @ 9am co-visit with pharmacy.  Daniel Limerick RN, BSN  [facility] Care Management [phone])

## 2015-06-04 NOTE — Patient Outreach (Signed)
Lydia Meng is a 81 y.o. male referred to pharmacy for medication adherence. Called with Nurse Care Manager Janci Minor to schedule a nursing and pharmacy co-visit with her and her father. Scheduled visit for Monday, November 7th at 9 AM. Ms. Rideout reports that her father moved in September to an apartment [address]; [place], [place] [address].   [name], PharmD Clinical Pharmacist [place] Management [phone]

## 2015-06-04 NOTE — Patient Outreach (Signed)
[name] Care Management  06/04/2015  Daniel [name] 04-Dec-1927 [id number]   Request from [name], RN to assign SW, assigned [name], [name].  Thanks, [name]. [name], [name] Assistant Phone: [phone] Fax: [phone]

## 2015-06-09 ENCOUNTER — Other Ambulatory Visit: Payer: Self-pay | Admitting: Pharmacist

## 2015-06-09 ENCOUNTER — Encounter: Payer: Self-pay | Admitting: *Deleted

## 2015-06-09 ENCOUNTER — Other Ambulatory Visit: Payer: Self-pay | Admitting: *Deleted

## 2015-06-09 NOTE — Patient Outreach (Signed)
[name]) Care Management   06/09/2015  Daniel [name] [birth year] [id number]  Daniel [name] is an 81 y.o. male  Subjective: " I drink orange juice, grape juice and grapefruit juice every day with meals." " I do not check my sugars like I should."  Objective: Blood pressure 130/70, pulse 78, resp. rate 18, height 1.753 m (5\' 9" ), weight 245 lb (111.131 kg), SpO2 97 %.  Review of Systems  Cardiovascular: Positive for leg swelling.  Musculoskeletal: Positive for back pain. Negative for falls.    Physical Exam  Constitutional: He is oriented to person, place, and time. He appears well-developed and well-nourished.  Cardiovascular: Normal rate and regular rhythm.   Pulses:      Popliteal pulses are 1+ on the right side, and 1+ on the left side.  Respiratory: Effort normal and breath sounds normal.  GI: Soft. Bowel sounds are normal.  Musculoskeletal:       Lumbar back: He exhibits tenderness.  Neurological: He is alert and oriented to person, place, and time.  Skin: Skin is warm, dry and intact.  Psychiatric: He has a normal mood and affect. His behavior is normal. Judgment and thought content normal. His speech is tangential. He exhibits abnormal recent memory.  Pt hard to stay focused on RNCM's questions. Beacme easily distracted and focused on non-relevant issues.    Current Medications:   Current Outpatient Prescriptions  Medication Sig Dispense Refill  . amLODipine (NORVASC) 10 MG tablet Take 10 mg by mouth daily.    [name] atorvastatin (LIPITOR) 10 MG tablet Take 5 mg by mouth daily.    . bumetanide (BUMEX) 0.5 MG tablet Take 0.5 mg by mouth daily.    . canagliflozin (INVOKANA) 100 MG TABS tablet Take 100 mg by mouth daily.    . Hypromellose (NATURAL BALANCE TEARS) 0.4 % SOLN Apply 1 drop to eye every 6 (six) hours as needed (for dry eyes).    . Insulin Glargine (TOUJEO SOLOSTAR) 300 UNIT/ML SOPN Inject 60 Units into the skin daily.    [name] losartan (COZAAR) 25 MG  tablet Take 25 mg by mouth daily.    . memantine (NAMENDA XR) 28 MG CP24 24 hr capsule Take 28 mg by mouth daily.    . naproxen sodium (ANAPROX) 220 MG tablet Take 220 mg by mouth 2 (two) times daily with a meal.     No current facility-administered medications for this visit.    Functional Status:   In your present state of health, do you have any difficulty performing the following activities: 06/09/2015 05/27/2015  Hearing? - Y  Vision? - Y  Difficulty concentrating or making decisions? Y -  Walking or climbing stairs? - Y  Dressing or bathing? - N  Doing errands, shopping? - Y  Preparing Food and eating ? Y -  Managing your Finances? Y -  Housekeeping or managing your Housekeeping? Y -    Fall/Depression Screening:    PHQ 2/9 Scores 05/27/2015  PHQ - 2 Score 1   Fall Risk  06/09/2015 05/27/2015  Falls in the past year? No No  Risk for fall due to : Impaired mobility;Impaired vision History of fall(s);Impaired mobility  Risk for fall due to (comments): - States he fell in 2015 sustaining leg injury theat required surgery    Assessment: Co-visit with [name] Pharmacist [name].  Pt alert and oriented x3 but noted to be forgetful about diabetes care. Pt stating daughter and girlfriend assist him with grocery shopping and meals. Pt  reporting eating a lot of processed foods and fruit juices.   Diabetes: Glucometer showing sugars from 150-500 but unable to clarify with pt the dates and unable to reset correct date on machine. Pt with strips and lancets present. Pt' bilat feet with extremely dry skin, does report seeing a podiatrist for foot care. Pharmacist counseled pt on diabetic medications.   Pt reporting inability to bath self except for a sponge bath. States he would benefit from personal care services. Pt using a walker and report s still driving when necessary.   RNCM and pharmacist were late to pt appt related to becoming lost, so RNCM had to leave to go to another  visit. Pharmacist stayed and completed interview with pt. RNCM made a plan to come back to see pt in 2 weeks.    Plan: RNCM will collaborate with CSW related to pt's needs. RNCM will see pt in 2 weeks.   [name] RN, BSN  [facility] Care Management [phone])  [facility] CM Care Plan Problem One        Most Recent Value   Care Plan Problem One  knowledge deficit of diabetes self care related to pt's lack of blood sugar values, voiced poor diet choices and inability to answer questions about diabetes correctly.   Role Documenting the Problem One  Care Management [place] for Problem One  Active   THN Long Term Goal (31-90 days)  Pt will be able to verbalize proper self care of diabetes in the next 90 days.   THN Long Term Goal Start Date  06/09/15   Interventions for Problem One Long Term Goal  RNCM educated pt on the importance of diabetes care to various systems in the body to prevent longterm complications.   THN CM Short Term Goal #1 (0-30 days)  In the next 30 days pt with decrease the amount of fruit juice to 1 glass per day.   THN CM Short Term Goal #1 Start Date  06/09/15   Interventions for Short Term Goal #1  RNCM educated pt on the amount of sugar in fruit juice. RNCM and pt discussed his regular diet and  the foods that could be causing sugars to spike.    THN CM Short Term Goal #2 (0-30 days)  Pt will check and record blood glucose  every day  for the next 30 days    THN CM Short Term Goal #2 Start Date  06/09/15   Interventions for Short Term Goal #2  RNCM watched pt check his own sugar to observe, technique. Education given to pt about the importance of checking sugars daily.[name]

## 2015-06-09 NOTE — Patient Outreach (Signed)
Daniel [name]) Care Management  [place]   06/09/2015  Daniel [name] June 08, 1928 [id number]  Subjective: Daniel [name] is a 81 y.o. male referred to pharmacy for medication adherence. Address for patient provided by patient's daughter during phone call on 06/04/15 was [address] but patient's correct address [name], [name]. Presented to patient's home today to met with Daniel [name] and his daughter Daniel [name] as a co-visit with Nurse Care Manager Daniel [name]. Daniel [name] is not present for today's visit. See Nurse Care Manager's note for vitals, physical examination and report of patient's diet.  Reports that his friend Daniel [name] helps with his washing and groceries. Reports that his daughter, Daniel [name], also helps to care for him. Reports that his phone number has recently been changed to [phone].  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 Reports that he takes over the counter Aleve as needed for this pain. Counsel patient to always take Aleve with food. Mr. Cutting reports that his PCP is aware of his taking the Aleve, needing it occasionally and never taking doses less than 12 hours apart. Reports that acetaminophen does not work for him.  Patient shows me his weekly pillbox. Reports that a home health nurse used to come to see him each week, but is no longer coming. Reports that this nurse had filled his pillbox for him each week, but now the patient does this himself. Reports that he fills it when it gets low and just puts each medication in the pillbox as directed on the prescription bottle. Patient reports that he gets each of his medications from Daniel [name] on [address]. Reports that he rarely misses a dose, less than once/week. Patient aware of today's day of the week. Patient reports that if he is unable to remember the day, he simply opens his phone to check.  Patient  has each of his medications listed on his EPIC medication record, including his Toujeo Insulin pens. Reports that he is injecting 60 units of his Toujeo every evening with supper. Patient currently keeps all of his pens in the fridge. Discussed with patient how the pen that he is currently using can be kept at room temperature. Patient reports that he prefers to keep his current pen in the fridge as well and just let it warm up before he injects each evening. Reports that it is easier for him to keep this pen in the same place as the others so that he knows where it is. Patient reports that he checks his blood sugar each morning. However, patient's log and monitor show no readings for the past few months. Patient checks his blood sugar during this visit and it is currently 324 mg/dL. Reports that Daniel [name] instructed him to increase his Toujeo to 65 units nightly at their last visit, but that he did not increase the dose because he was afraid that he would run out of his insulin. Let patient know that if Daniel [name] has increased his dose, his pharmacy simply needs those updated instructions so that he is able to get his medication refills on time. Reports that his Nelva Nay is currently free to him.  Review patient's pillbox. Review with patient each of these medications and write the indication for each on the bottle for the patient. Patient's pillbox is accurately filled days Monday-Saturday with one of each of his medications as directed  by his medication record except that patient reports and I see from his pillbox that patient has only been taking 1/2 tablet of his atorvastatin 10 mg daily. Note that this EPIC record also reflects that dose, but patient's current bottle indicates that patient is to take a full tablet daily. While with patient, call patient's PCP's office to verify his Toujeo and atorvastatin doses. Speak with Daniel [name], nurse in Daniel [name]' office. Daniel [name] confirms that patient's dose of Toujeo  was increased by Daniel [name] to 65 units nightly. Request that their office send an updated prescription to the patient's pharmacy today. Daniel [name] states that they will do so today. Daniel [name] also confirms that patient's atorvastatin dose has been increased to 10 mg tablet daily. Patient verbalizes understanding of both of these dosage changes and states that he will begin taking both accordingly.  Patient shows [place] his current Medicare Card, which is through [place], administered by Daniel [name], [place] # [id number] (Customer Care # [phone]).  Objective:   Current Medications: Current Outpatient Prescriptions  Medication Sig Dispense Refill  . amLODipine (NORVASC) 10 MG tablet Take 10 mg by mouth daily.    [name] atorvastatin (LIPITOR) 10 MG tablet Take 5 mg by mouth daily.    . bumetanide (BUMEX) 0.5 MG tablet Take 0.5 mg by mouth daily.    . canagliflozin (INVOKANA) 100 MG TABS tablet Take 100 mg by mouth daily.    . Hypromellose (NATURAL BALANCE TEARS) 0.4 % SOLN Apply 1 drop to eye every 6 (six) hours as needed (for dry eyes).    . Insulin Glargine (TOUJEO SOLOSTAR) 300 UNIT/ML SOPN Inject 60 Units into the skin daily.    [name] losartan (COZAAR) 25 MG tablet Take 25 mg by mouth daily.    . memantine (NAMENDA XR) 28 MG CP24 24 hr capsule Take 28 mg by mouth daily.     No current facility-administered medications for this visit.    Functional Status: In your present state of health, do you have any difficulty performing the following activities: 05/27/2015  Hearing? Y  Vision? Y  Walking or climbing stairs? Y  Dressing or bathing? N  Doing errands, shopping? Y    Fall/Depression Screening: PHQ 2/9 Scores 05/27/2015  PHQ - 2 Score 1    Assessment:  1) Patient currently non-adherent to checking his blood sugar. Instructed by Nurse Care Manager to start checking and recording his blood sugar twice daily. Patient's current blood sugar level is elevated.  2) Patient currently  non-adherent to his prescribed Toujeo and atorvastatin dosing.  Plan:  1) Patient to start taking Toujeo Insulin 65 units subcutaneously nightly at bedtime as directed by Daniel [name]. Patient to check blood sugar twice daily and record.  2) Patient to take Aleve only when needed and always with food  3) Patient to take atorvastatin 10 mg daily as directed by Daniel [name].  4) Will contact patient's PCP for patient's most recent lab work, including serum creatinine, eGFR and hemoglobin A1C.   4) Let Mr. Surrette know that I will follow up with him in 2 weeks.   [name], PharmD Clinical Pharmacist [place] [facility] [phone]

## 2015-06-11 ENCOUNTER — Other Ambulatory Visit: Payer: Self-pay | Admitting: Pharmacist

## 2015-06-11 NOTE — Patient Outreach (Addendum)
Contact patient's PCP for patient's most recent lab work, including serum creatinine, eGFR and hemoglobin A1C. Spoke with triage nurse [name]. [name] reports that this lab work was last ordered at his visit in October. However, reports that their records show no result from these labs from [place] reports that after checking with [facility], the samples were never taken. Reports that she will need to call the patient to make an appointment for him to come in and have these labs drawn.    [name], PharmD Clinical Pharmacist [place] [facility] [phone]

## 2015-06-13 ENCOUNTER — Other Ambulatory Visit: Payer: Self-pay | Admitting: *Deleted

## 2015-06-13 NOTE — Patient Outreach (Addendum)
[place] [facility]) Care Management  06/13/2015  Daniel [name] Jul 15, 1928 [id number]   Phone call to [name], from [facility] [phone] ext [phone] to discuss timeframe to engage patient as well to discuss long term care needs.  Voicemail message left for a return call.    [name] [facility] Care Management [phone]

## 2015-06-13 NOTE — Patient Outreach (Signed)
[place] [facility]) Care Management  06/13/2015  Garon Leinonen [birth year] [id number]    Phone call to patient's daughter with the verbal permission from patient on 06/12/15 at 4:22pm who discussed need for patient to return to long term care.  Per patient's daughter, patient had a 9 month stay at [facility] (04/16/14-02/15/15) however signed himself out due to financial reasons.  Patient had long term Medicaid at the time, however does not qualify  for full medicaid benefits outside of long term placement due to his income.  Patient's daughter concerned that patient does not take his medications appropriately, his hygiene is poor, he continues to drive however gets turned around often.  Patient's daughter would like patient to return to an Assisted Living, however is aware that he signed himself out the first time.  This Education officer, museum discussed limits to placement if patient refuses.    Plan:  This social worker wilss collaborate with patient's primary care doctor as well as RNCM to discussed possible  resources and supports in the home to increase safety.

## 2015-06-20 ENCOUNTER — Other Ambulatory Visit: Payer: Self-pay | Admitting: Pharmacist

## 2015-06-20 NOTE — Patient Outreach (Signed)
Called to follow up with Dr. Baird Cancer' office to verify that patient has been scheduled for a follow up visit to have labs drawn. Per my previous discussion with Daniel [name] in the office, the samples were never taken at his October visit. Speak with Daniel [name] in the office who reports that the patient has not yet been scheduled for this follow up visit. Provide her with our most recent cell phone number for the patient and his daughter.  Daniel [name] states that she will call now to schedule this with the patient. Reports that she will call me back once scheduled.  Daniel [name], PharmD Clinical Pharmacist [place] Management [phone]

## 2015-06-27 ENCOUNTER — Other Ambulatory Visit: Payer: Self-pay | Admitting: Pharmacist

## 2015-06-27 NOTE — Patient Outreach (Signed)
Called to follow up with Mr. Kosin about his medication adherence and his lab work.  Mr. Wasley states that he has been doing well with his pillbox. Mr. Aronson independently reports that he did increase his dose of atorvastatin to a full tablet daily and his Toujeo dose to 65 units daily as directed by Dr. Baird Cancer. Reports that he has about a week's worth of medication left and that his medications are on automatic refill at his pharmacy. Reports that he will have his granddaughter go and pick this medication up for him.  Patient reports that he has been checking his blood sugar twice daily. However, reports that he has not yet checked his blood sugar this morning and that he did not check it at all yesterday as he was busy with Thanksgiving. Reports that his fasting morning blood sugar on Wednesday was 255 mg/dL and the next reading he reports prior to that is from Saturday morning, 257 mg/dL. Discuss with patient his adherence to testing his blood sugar and patient admits that he has been missing his testing. Reports that he does try to test twice daily before breakfast and supper, but forgets. Counsel patient to put his testing log out near where he eats to help him to remember to test before each of these meals. Patient states that he will.  Reports that he his daughter Maudie Mercury recently fell down a set of stairs. Reports that she had her leg broken in two places.  Reports that he received a call from his doctor's office about getting his lab work drawn. States that he does not feel comfortable with driving himself to this office in [place]. States that the office offered to have him get his blood drawn at a lab in [place], but that he did not feel comfortable driving there because he does not know the location and is afraid of getting lost. Agreed with the patient that it did not seem like a safe idea for him to drive to either location on his own. Patient states that he has no one to take him since his  daughter is now unable to drive.   Patient also reports that he is still wondering about home health coming back because he can't reach everywhere to shower. Reports that he does not have the range of motion to wash his entire body. Advised patient to discuss this with Dr. Baird Cancer. Provide patient with Dr. Baird Cancer' phone number. Also let Mr. Biernacki know that I will reach out to Social Worker [name] about his desire to have home health back.  Mr. Filyaw reports that he has no further questions for me. Provide Mr. Babbit with my phone number again.   PLAN:  1) Will follow up with Social Worker Chrystal Land on 06/30/15 both about patient's interest in having home health return and about patient's need for transportation in order to get his blood work drawn.  2) Will also follow up with Nurse Care Manager Janci Minor about this patient today.  [name], PharmD Clinical Pharmacist [place] Management [phone]

## 2015-06-30 ENCOUNTER — Other Ambulatory Visit: Payer: Self-pay | Admitting: Pharmacist

## 2015-06-30 ENCOUNTER — Other Ambulatory Visit: Payer: Self-pay | Admitting: *Deleted

## 2015-06-30 NOTE — Patient Outreach (Signed)
[place] [facility]) Care Management  06/30/2015  Daniel [name] 1928-01-23 [id number]   Phone call from Pharmacist Merita Norton requesting that transportation be arranged for patient to get his labs drawn at his primary care doctor's office.  Patient unable to drive due to his physical limitations, there is also a concern about patient's safety as he becomes easily confused while traveling out of the city.     Discussed transportation needs with patient.  Patient admits to not feeling safe driving stating that his daughter was his back up plan but she recently broke her leg.  Patient also discussed the need for personal care services.   Plan:  Home visit scheduled for 07/03/15 at 10:30am to discuss long term plan for transportation to medical appointments and the arrangement of personal care services. Care management assistant to arrange transportation to providers office to get labs drawn.    [name] [facility] Care Management [phone]

## 2015-06-30 NOTE — Patient Outreach (Signed)
Called to follow up with Dr. Baird Cancer' office to confirm locations where the patient's lab work can be drawn and that the orders are in place. Spoke with Tanika in Dr. Baird Cancer' office. Ludger Nutting reviews the lab work that Dr. Baird Cancer ordered but was not completed at the patient's last visit. Ludger Nutting states that the patient will not be able to have his blood drawn at the lab draw site in [place] because of the types of labs that are to be drawn. States that patient will need to have transportation to their office in [place] instead. States that the patient can come any time that they are open, but to just give a call to put him on the schedule for a lab appointment once the transportation is arranged.  Will send a follow up InBasket message to Social Worker Knapp to let her know that the patient will need to go to the [place] office. Will follow with Chrystal to find out when this lab work is completed and then follow up with Dr. Baird Cancer' office for the results.   [name], PharmD Clinical Pharmacist [place] Management [phone]

## 2015-06-30 NOTE — Patient Outreach (Signed)
Placed a care coordination call to [place] to discuss Mr. Jha' need for transportation in order to have blood drawn for Dr. Baird Cancer' office. Also discussed patient's inquiry about home health coming back because he can't reach everywhere to shower, due to limited range of motion to wash his entire body.   Chrystal states that she will follow up with the patient about arranging transportation to take him to have his lab work drawn and to discuss having personal care services come to his home to help with his bathing.  I will follow up with Dr. Baird Cancer' office today to confirm locations where the patient's lab work can be drawn and that the orders are in place.  [name], PharmD Clinical Pharmacist [place] Management [phone]

## 2015-07-01 NOTE — Patient Outreach (Signed)
[place] [facility]) Care Management  07/01/2015  Daniel [name] 16-May-1928 [id number]   Request received from [facility], [name] to arrange transportation to patient's appointment on 07/02/2015 at 11am to [facility]. Transportation was arranged and confirmed with [employer]. Telephone outreach to patient to inform him of transportation arrangements to his appointment.  Tressa Maldonado L. Azyria Osmon, [place] Care Management Assistant

## 2015-07-02 ENCOUNTER — Ambulatory Visit: Payer: Self-pay | Admitting: *Deleted

## 2015-07-02 DIAGNOSIS — E1122 Type 2 diabetes mellitus with diabetic chronic kidney disease: Secondary | ICD-10-CM | POA: Diagnosis not present

## 2015-07-03 ENCOUNTER — Other Ambulatory Visit: Payer: Self-pay | Admitting: *Deleted

## 2015-07-03 ENCOUNTER — Encounter: Payer: Self-pay | Admitting: *Deleted

## 2015-07-03 NOTE — Patient Outreach (Signed)
[facility] [facility]) Care Management   07/03/2015  Daniel [name] October 04, 1927 [id number]  Ott Esse is an 81 y.o. male  Subjective: "I know I need to get my medications but i have to have someone pick them up for me." "My main diet is sardines, [place] sausages and slices of bread."  Objective: Blood pressure 130/70, pulse 82, resp. rate 18, SpO2 95 %.  Review of Systems  Cardiovascular: Positive for leg swelling.  Musculoskeletal: Negative for falls.  Psychiatric/Behavioral: Positive for memory loss.    Physical Exam  Constitutional: He is oriented to person, place, and time. He appears well-developed and well-nourished.  Cardiovascular: Normal rate and regular rhythm.   Respiratory: Effort normal and breath sounds normal.  GI: Soft. Bowel sounds are normal.  Musculoskeletal: He exhibits edema.  Neurological: He is alert and oriented to person, place, and time.  Skin: Skin is warm and dry.  Psychiatric: He has a normal mood and affect. His speech is normal. He exhibits abnormal recent memory.    Current Medications:   Current Outpatient Prescriptions  Medication Sig Dispense Refill  . amLODipine (NORVASC) 10 MG tablet Take 10 mg by mouth daily.    [name] atorvastatin (LIPITOR) 10 MG tablet Take 5 mg by mouth daily.    . bumetanide (BUMEX) 0.5 MG tablet Take 0.5 mg by mouth daily.    . canagliflozin (INVOKANA) 100 MG TABS tablet Take 100 mg by mouth daily.    . Hypromellose (NATURAL BALANCE TEARS) 0.4 % SOLN Apply 1 drop to eye every 6 (six) hours as needed (for dry eyes).    . Insulin Glargine (TOUJEO SOLOSTAR) 300 UNIT/ML SOPN Inject 60 Units into the skin daily.    [name] losartan (COZAAR) 25 MG tablet Take 25 mg by mouth daily.    . memantine (NAMENDA XR) 28 MG CP24 24 hr capsule Take 28 mg by mouth daily.    . naproxen sodium (ANAPROX) 220 MG tablet Take 220 mg by mouth 2 (two) times daily with a meal.     No current facility-administered medications for this visit.     Functional Status:   In your present state of health, do you have any difficulty performing the following activities: 07/03/2015 06/09/2015  Hearing? [place]? N -  Difficulty concentrating or making decisions? Tempie Donning  Walking or climbing stairs? Y -  Dressing or bathing? Y -  Doing errands, shopping? Y -  Conservation officer, nature and eating ? Y Y  Using the Toilet? N -  In the past six months, have you accidently leaked urine? N -  Do you have problems with loss of bowel control? N -  Managing your Medications? (No Data) -  Managing your Finances? Tempie Donning  Housekeeping or managing your Housekeeping? Tempie Donning    Fall/Depression Screening:    PHQ 2/9 Scores 07/03/2015 05/27/2015  PHQ - 2 Score 0 1    Assessment: Co-visit with [place] who discussed with pt pcs and med alert.   RNCM did a med review with pt while in the home and noted all pt's medication bottles were empty. Pt stated medications needed to be called into the pharmacy. RNCM called pt's pharmacy and inquired about medications and pharmacy confirmed all pt's medications were awaiting pick up and had already been refilled. RNCM discussed with pt his plan to pick up medications. Pt was going to have granddaughter pick up medications or have a friend pick them up. RNCM discussed with pt nearby  pharmacies which delivered medications to his home and pt unwilling to change to this option at this time.  Diabetes: Pt was unreliable in checking sugars and meter had only a few numbers. Meter's date and time set and pt education done on the importance of checking/monitoring sugar. Pt did not want to remove shoes at this time for foot exam. RNCM and pt walked to pt's kitchen and RNCM did teaching with pt on carb amounts and his current diet. Pt confirmed he was drinking less grape juice. Carb counting picture sheet given to pt and explained. Pt unable to verbalized understanding of counting carbs. Pt able to verbalize insulin dose purpose and  time.  Plan: RNCM will see pt in one month RNCM will call pt next week to confirm he was able to obtain medications. RNCM will compile a list of pharmacies who deliver to give to pt at next visit.  [name] RN, BSN  [facility] Care Management [phone])

## 2015-07-03 NOTE — Patient Outreach (Signed)
[place] [facility]) Care Management  [facility] Social Work  07/03/2015  Daniel [name] 07/10/1928 [id number]  Subjective:  "I need help with getting my bath"  Objective:   Current Medications:  Current Outpatient Prescriptions  Medication Sig Dispense Refill  . amLODipine (NORVASC) 10 MG tablet Take 10 mg by mouth daily.    [name] atorvastatin (LIPITOR) 10 MG tablet Take 5 mg by mouth daily.    . bumetanide (BUMEX) 0.5 MG tablet Take 0.5 mg by mouth daily.    . canagliflozin (INVOKANA) 100 MG TABS tablet Take 100 mg by mouth daily.    . Hypromellose (NATURAL BALANCE TEARS) 0.4 % SOLN Apply 1 drop to eye every 6 (six) hours as needed (for dry eyes).    . Insulin Glargine (TOUJEO SOLOSTAR) 300 UNIT/ML SOPN Inject 60 Units into the skin daily.    [name] losartan (COZAAR) 25 MG tablet Take 25 mg by mouth daily.    . memantine (NAMENDA XR) 28 MG CP24 24 hr capsule Take 28 mg by mouth daily.    . naproxen sodium (ANAPROX) 220 MG tablet Take 220 mg by mouth 2 (two) times daily with a meal.     No current facility-administered medications for this visit.    Functional Status:  In your present state of health, do you have any difficulty performing the following activities: 06/09/2015 05/27/2015  Hearing? - Y  Vision? - Y  Difficulty concentrating or making decisions? Y -  Walking or climbing stairs? - Y  Dressing or bathing? - N  Doing errands, shopping? - Y  Preparing Food and eating ? Y -  Managing your Finances? Y -  Housekeeping or managing your Housekeeping? Y -    Fall/Depression Screening:  PHQ 2/9 Scores 05/27/2015  PHQ - 2 Score 1    Assessment: Co-visit with RNCM Daniel [name].  Patient discussed need for personal care assistance in the home.  It was explained that due to the fact that patient does not have Medicaid coverage, he would have to paty out of pocket for in home assistance.  Benefits of in home care discussed, safety risk emphasized as well as having assistance on a  consistent basis.  Home care agency contacted to discuss possible cost of care and program offerings.  Patient adamantly refused private pay option, stating that he was not willing to pay that amount.  "I will just continue to do the best I can by myself" "I will call my grandson or granddaughter to help me"  " I have friends that help me grocery shop when they can"  Patient accepting of  Med alert installation.  Installation arranged, spoke with Joycelyn Schmid [phone]  , Med alert system to be installed on 07/10/15 between 9 am and 12 pm to install.  Monthly charge of $14.00 discussed and accepted.  Per patient, his girlfriend Rosetta normally takes care of his bill payments and will take care of this one as well.    Patient currently on waiting list for  Meals on Wheels.  Patient gave this social worker verbal permission to speak to [place].  Emphasized the need for in home help.  She agrees and made attempt to talk to patient about the benefits as well, however patient continues to decline the assistance.  Med alert system charges discussed as well to ensure she includes this in patient's monthly bill payments.  Shower chair discussed, however per Rosettal patient rarely uses the shower because he cannot lift his legs over the tub so  he will probably not need the chair.      Plan:  This Education officer, museum will follow up on med alert system installation.          [facility] CM Care Plan Problem One        Most Recent Value   Care Plan Problem One  patient needs med alert system   Role Documenting the Problem One  Clinical Social Worker   Care Plan for Problem One  Active   THN CM Short Term Goal #1 (0-30 days)  patient will verbalize installation of med alert system within 30  days   THN CM Short Term Goal #1 Start Date  06/09/15   Interventions for Short Term Goal #1  [name] contacte med alert, through [facility], assisted patient  with arranging  installation scheduled for 07/10/15      [name] [facility] Care Management [phone]

## 2015-07-07 ENCOUNTER — Other Ambulatory Visit: Payer: Self-pay | Admitting: Pharmacist

## 2015-07-07 NOTE — Patient Outreach (Signed)
Per 07/01/15 note from Care Management Assistant Damita Rhodie, transportation arranged for patient to go to his PCP's office to have his blood work drawn on 07/02/15. Called to follow up with patient's PCP, Dr. Baird Cancer, for these results. Spoke directly with Dr. Baird Cancer to whom I provided our fax number. Dr. Baird Cancer stated that she would fax these results over.   Once this fax is received, will review and follow up with Dr. Baird Cancer' office about recommendations for adjustment of patient's medications to control his blood sugar.  [name], PharmD Clinical Pharmacist [facility] [phone]

## 2015-07-14 ENCOUNTER — Other Ambulatory Visit: Payer: Self-pay | Admitting: Pharmacist

## 2015-07-14 NOTE — Patient Outreach (Signed)
Received fax back from Dr. Baird Cancer with the patient's most recent lab results from 07/02/15.   Note the following lab results from this report: A1C 13.3% Serum Creatinine 1.57 eGFR 46 ml/min  Called Dr. Baird Cancer' office to speak with her about this patient to discuss his diabetes medication management options and an endocrinology referral. Note that management of this patient's blood sugar is complicated by his limited memory related to his diagnosis of dementia, his living alone and his renal function.   Spoke with Ebony in the office who reports that Dr. Baird Cancer is out of the office today. Leave my phone number and request a call back from Dr. Baird Cancer, letting Charlena Cross know that I will be in the office on Wednesday and Friday this week.  [name], PharmD Clinical Pharmacist [facility] [phone]

## 2015-07-16 ENCOUNTER — Other Ambulatory Visit: Payer: Self-pay | Admitting: *Deleted

## 2015-07-16 ENCOUNTER — Ambulatory Visit: Payer: Self-pay | Admitting: Pharmacist

## 2015-07-16 NOTE — Patient Outreach (Signed)
[place] [facility]) Care Management  07/16/2015  Famous Speller 1928-02-05 [id number]   Phone call to patient to confirm that his med alert system has been installed for emergencies.  Per patient, they came last week and installed the system and taught him how to use it.  Per patient, he does not want to drive due to safety reasons, however had to drive to get his medication because he had no one else to take him.  Per patient his daughter broke her leg and is unable to help him.  He also has a grand daughter that helps but she is in school and care is not consistent.  This social worker re-visited hiring a personal care aid, to assist patient with his ADL's and medication compliance, however patient continues to refuse this.  Per patient, his sugars are running high and he is not sure why. RNCM Janci Minor will visit patient on 07/17/15 to assist with diabetes management education.   [name] [facility] Care Management [phone]

## 2015-07-16 NOTE — Patient Outreach (Signed)
RNCM called pt to discuss pt's A1c and medications. Pt clear to person place and time. Pt stated he had all medications and was able to retrieve bottles and complete med review. RNCM made pt aware that labs showed blood sugars running extremely high. Pt stated he knew they were high and just can't figure out why. RNCM discussed insulin with pt and pt was clear on dose and was able to describe in detail the technique of injecting himself with the insulin. Pt stated he had to drive to pick up his insulin today and was nervous related to his walking is not good and "there is so much traffic on the road" RNCM discussed again with pt the possibility of changing to a drug store that delivers and puts the medications in blister packs for easy remembering. Pt stated he did not forget to take medications, but was interested in the delivery service at this time. RNCM discussed with pt at length again his daily diet, which did not include a great amount of carbs, juices or sweets according to pt. Pt sated his am sugar was 344 and the lowest reading he has had is 250 since he started checking BID in the last week per RNCM's instructions. RNCM again talked with pt about having someone come out and assist with some ADLs related to pt stating he cannot bath his back or feet and pt adamantly refused. Pt agreeable for RNCM to come out to see pt in am to observe insulin administration technique.   RNCM called [facility] to inquire about what was needed to switch pt's medications over and was told to bring in pt's medication bottles.   Plan: RNCM will see pt in the am.  Jahyra Sukup RN, BSN  [facility] Care Management [phone])

## 2015-07-17 ENCOUNTER — Other Ambulatory Visit: Payer: Self-pay | Admitting: *Deleted

## 2015-07-17 NOTE — Patient Outreach (Addendum)
[place] [facility]) Care Management   07/17/2015  Daniel [name] 05-03-1928 [id number]  Daniel [name] is an 81 y.o. male  Subjective: " I have not been taking the second cap off of the needle and this shot I could feel more than the ones in the past." "I am needing my medications delivered, I drove to get my insulin yesterday and it was so cold."  Objective: Blood pressure 128/66, pulse 80, resp. rate 18, height 1.753 m ('5\' 9"' ), weight 245 lb (111.131 kg), SpO2 96 %.   Review of Systems  Eyes: Positive for blurred vision.  Musculoskeletal: Negative for falls.  All other systems reviewed and are negative.   Physical Exam  Constitutional: He is oriented to person, place, and time. He appears well-developed and well-nourished.  Cardiovascular: Normal rate and regular rhythm.   Respiratory: Effort normal and breath sounds normal.  GI: Soft. Bowel sounds are normal.  Musculoskeletal: Normal range of motion.  Neurological: He is alert and oriented to person, place, and time.  Skin: Skin is warm and dry.  Psychiatric: He has a normal mood and affect. His behavior is normal. Judgment and thought content normal.    Current Medications:   Current Outpatient Prescriptions  Medication Sig Dispense Refill  . amLODipine (NORVASC) 10 MG tablet Take 10 mg by mouth daily.    [name] atorvastatin (LIPITOR) 10 MG tablet Take 5 mg by mouth daily.    . bumetanide (BUMEX) 0.5 MG tablet Take 0.5 mg by mouth daily.    . canagliflozin (INVOKANA) 100 MG TABS tablet Take 100 mg by mouth daily.    . Hypromellose (NATURAL BALANCE TEARS) 0.4 % SOLN Apply 1 drop to eye every 6 (six) hours as needed (for dry eyes).    . Insulin Glargine (TOUJEO SOLOSTAR) 300 UNIT/ML SOPN Inject 60 Units into the skin daily.    [name] losartan (COZAAR) 25 MG tablet Take 25 mg by mouth daily.    . memantine (NAMENDA XR) 28 MG CP24 24 hr capsule Take 28 mg by mouth daily.    . naproxen sodium (ANAPROX) 220 MG tablet Take 220 mg  by mouth 2 (two) times daily with a meal.     No current facility-administered medications for this visit.    Functional Status:   In your present state of health, do you have any difficulty performing the following activities: 07/03/2015 06/09/2015  Hearing? Daniel [name]? N -  Difficulty concentrating or making decisions? Daniel [name]  Walking or climbing stairs? Y -  Dressing or bathing? Y -  Doing errands, shopping? Y -  Conservation officer, nature and eating ? Y Y  Using the Toilet? N -  In the past six months, have you accidently leaked urine? N -  Do you have problems with loss of bowel control? N -  Managing your Medications? (No Data) -  Managing your Finances? Daniel [name]  Housekeeping or managing your Housekeeping? Daniel [name]    Fall/Depression Screening:    PHQ 2/9 Scores 07/03/2015 05/27/2015  PHQ - 2 Score 0 1    Assessment: Daniel [name] arrived at pt's home right as pt was waking. Pt checked fasting blood sugar demonstrating good technique, results were 378. Daniel [name] checked pt meter and noted pt had been checking sugars twice a day for the last two weeks as instructed. Highest reading was 562 on 07/15/15 '@9am' . [name] lowest reading was 266 on 07/09/15 at 9:30 am. Pt reports these sugars were before breakfast. After coordination phone call with THN-Pharmacist  Daniel [name] about toujeo dosing times, Daniel [name] requested pt demonstrate insulin injection technique and give himself a dose of insulin. During demonstration pt had good technique except for did not remove the inner cap (the small cap to the pt side needle). Pt was seeing the needle that punctured the pen and thought that was the one injecting him with the medications. Daniel [name] did teaching and showed pt the pt side needle. Pt verbalized understanding of taking insulin properly and admitted to have not been taking the second cap off. Daniel [name] made reminder notes for pt's fridge, bathroom mirror and beside pt's bed about taking insulin and removing both needle caps. [facility] logbook  given to pt to record sugars, and reminder note written at the top of page.   Daniel [name] observed pt while he filled weekly pill organizer. Medications for this am in proper slot and were correct. Pt able to state the purposes of medications and knew to take Invokana prior to breakfast as pharmacist had instructed. Daniel [name] noted the fill days on the bottles and counted the amount of medications left in the bottles. Pt had correct amount of medication left in the bottles. Daniel [name] discussed with pt having medications delivered and pt was agreeable. Daniel [name] took empty medication bottles from pt home for new pharmacy as requested. Daniel [name] gave pt new pharmacy number. [facility] will now be pt's pharmacy. Daniel [name] used her meter to check pt's sugar 25mns after insulin dose and sugar was 294 post meal.   Daniel [name] observed pt cook his own breakfast of eggs and coffee and pt noted to be safe in the kitchen. Pt using rollator walker to sit on while he cooked.   Daniel [name] took all empty medication containers to [facility] request prescription transfer and pt delivery which pharmacy offers free. Delivery scheduled for next Friday per pt's instructions.  Daniel [name] contacted pt after lunch and had him check his sugar while Daniel [name] on the phone and pt got 353.   Daniel [name] contacted PCP office at 3:30 and left a message with Tamika to have Daniel [name] return a call in regards to pt.  Plan:  Daniel [name] will attempt to contact MD office again next week. Daniel [name] will contact pt on Tuesday to [name] if insulin has improved sugars.   Daniel Gover RN, BSN  [facility] [phone]  [facility] Care Plan Problem One        Most Recent Value   Care Plan Problem One  knowledge deficit of diabetes self care related to pt's lack of blood sugar values, voiced poor diet choices and inability to answer questions about diabetes correctly.   Role Documenting the Problem One  Care Management [place]   Active   THN Long Term Goal (31-90 days)  Pt will be able to verbalize proper self care of diabetes in the next 90 days.   THN Long Term Goal Start Date  06/09/15   Interventions for Problem One Long Term Goal  Daniel [name] educated pt on the importance of diabetes care to various systems in the body to prevent longterm complications.   THN CM Short Term Goal #1 (0-30 days)  In the next 30 days pt with decrease the amount of fruit juice to 1 glass per day.   THN CM Short Term Goal #1 Start Date  06/09/15   [facility] Met Date  07/03/15   Interventions for Short Term Goal #1  Daniel [name]  educated pt on the amount of sugar in fruit juice. Daniel [name] and pt discussed his regular diet and  the foods that could be causing sugars to spike.    THN CM Short Term Goal #2 (0-30 days)  Pt will check and record blood glucose  every day  for the next 30 days    THN CM Short Term Goal #2 Start Date  07/03/15 [goal restarted.]   Interventions for Short Term Goal #2  Daniel [name] watched pt check his own sugar to observe, technique. Education given to pt about the importance of checking sugars daily..   THN CM Short Term Goal #3 (0-30 days)  Pt will obtain medications from pharmacy in the next 7 days.    THN CM Short Term Goal #3 Start Date  07/03/15   [facility] CM Short Term Goal #3 Met Date  07/17/15   Interventions for Short Tern Goal #3  Daniel [name] called to make sure meds were at pharmacy. Daniel [name] discussed with pt his plan to obtain medications.   THN CM Short Term Goal #4 (0-30 days)  Pt will have improved sugars with improved insulin administration technique in the next 30 days.    THN CM Short Term Goal #4 Start Date  07/17/15   Interventions for Short Term Goal #4  Daniel [name] observed pt taking insulin and discovered poor technique. Pt educated to remove both insulin caps before injecting insulin. Daniel [name] made 3 reminder notes and placed in prominent places in pt home to remind him of insulin.

## 2015-07-21 ENCOUNTER — Other Ambulatory Visit: Payer: Self-pay | Admitting: Pharmacist

## 2015-07-21 NOTE — Patient Outreach (Signed)
Called to follow up with Mr. Venus. Note that per Care Coordination call with Janci Minor, RNCM on 07/17/15 RNCM requested pt demonstrate insulin injection technique and found that patient was not removing the inner cap (the small cap to the pt side needle) when giving himself his Toujeo. Spoke with Mr. Seabold today who reports that he has since been removing this second cap in order to give himself his injections.  Reports that following blood sugars from the past few days:  12/16: 190 morning fasting;  12/17: 175 morning fasting; 12/18: forgot to test  12/19: 95 mg/dL morning fasting  Patient reports that he does not like his blood sugar to get below 100 mg/dL. Denies any symptoms of hypoglycemia, but reports that he drank a little bit of orange juice to make sure that his blood sugar came back up this morning.  Patient reports that he has been forgetting to test in the evening. Counseled patient on the importance of testing before supper as well. Patient verbalized understanding and states that he will do this moving forward.  Mr. Offermann reports that he has no questions for me at this time. Let patient know that I will follow up with him later this week to check on his blood sugars. Again emphasized the importance of the patient testing in the evening.   [name], PharmD Clinical Pharmacist [place] Management [phone]

## 2015-07-23 ENCOUNTER — Other Ambulatory Visit: Payer: Self-pay | Admitting: *Deleted

## 2015-07-23 NOTE — Patient Outreach (Signed)
RNCM called pt to follow up with insulin and blood sugar values. Pt stated he was very happy with his new numbers and still can't believe he had been using insulin incorrectly all this time. RNCM encouraged pt and congratulated him on a job well done for the past week checking sugars and using insulin correctly. Pt read off am sugar values of 135, 119, and 92. Pt did not have but one pm sugar which was 284. RNCM encouraged pt to check sugar at night so that his MD would have a good indication to base medication changes on. RNCM reminded pt that his medications would be delivered on Friday by [place]. RNCM  Made an appointment to see pt on Friday morning to assess sugars in person. Also made pt aware pharmacist [name] would be joining [facility] at visit.    Plan: RNCM will attend a co-pharmacy visit with pt on 12/23. RNCM will observe pill box to confirm pt filling medications correctly.  [name] RN, BSN  [facility] Care Management [phone])

## 2015-07-25 ENCOUNTER — Other Ambulatory Visit: Payer: Self-pay | Admitting: Pharmacist

## 2015-07-25 ENCOUNTER — Other Ambulatory Visit: Payer: Self-pay | Admitting: *Deleted

## 2015-07-25 NOTE — Patient Outreach (Signed)
[facility]) Care Management  [place]   07/25/2015  Saim Almanza [birth year] [id number]  Subjective: Met with Mr. Muccio today in his home as a co-visit with Nurse Care Manager Janci Minor to follow up about his blood sugars and medication management.  Mr. Foushee shows [place] his blood sugar log. He has recorded the following blood sugars: 12/20: 125 mg/dL (morning fasting) 184 mg/dL (after supper) 12/21: 92 mg/dL (morning fasting)  249 mg/dL (after supper) 12/22: 86 mg/dL (morning fasting)  355 mg/dL (after supper)  12/23: 80 mg/dL (morning fasting)  Patient reports that on mornings when he wakes and finds his blood sugar to be less than 100 mg/dL in the morning, he drinks a little glass of orange juice to bring it back up. Congratulate patient for remembering to check and record his blood sugar every morning and every evening.   While with the patient, call patient's PCP's office to discuss his blood sugar, renal function and current diabetes medication regimen. Dr. Baird Cancer is in not in the office today; instead speak with Doreene Burke, Nurse Practitioner in the office. Note that patient's most recent A1C and eGFR from 07/02/15 were A1C 13.3% and eGFR 46 ml/min. However, let Doreene Burke know that following this lab result, on 07/17/15, Nurse Care Manager Janci Minor made a home visit to the patient and upon having him demonstrate his injection technique, found that the patient was not removing the inner needle cap of his Toujeo for his injections. Reported to [facility] the patient's blood sugars from his log (as listed above) from the past few days. Also expressed concern about the patient continuing on Invokana given his current renal function. Recommended provider consider switching patient to Victoza or renally adjusted Januvia to better target the patient's meal time blood sugar elevations and suggested a slight lowering of his Toujeo dose to prevent morning lows.  Janece instructed  the patient to stop the Invokana, start Januvia 50 mg once daily and to decrease his Toujeo dose to 60 units each evening. With Janci, reviewed these changes with the patient several times, recorded these in his [place] and helped him to make reminder notes of his insulin dose decrease around the home. Patient demonstrated his understanding of these medication changes using the teach-back method. Merlene Morse also verbally reviewed with patient again his injection technique for the Toujeo. Reviewed with patient the importance of continuing to check and record his blood sugar twice daily. Wrote down for patient the numbers for his physician's office and our 24-hour nurse line for the patient to call, particularly over the weekend, for any issues, particularly with his blood sugar. Janci assisted the patient with removing the Invokana from his pillbox.  While with the patient, Merlene Morse also called [facility] to make sure that the patient's medications would be delivered today. I spoke with the pharmacist and confirmed that they received the Januvia e-prescribed by [facility] and that they canceled the Invokana prescription per Jancece's instruction.  See note from [place] Minor for further details from this visit.  Objective:   Current Medications: Current Outpatient Prescriptions  Medication Sig Dispense Refill  . amLODipine (NORVASC) 10 MG tablet Take 10 mg by mouth daily.    [name] atorvastatin (LIPITOR) 10 MG tablet Take 5 mg by mouth daily.    . bumetanide (BUMEX) 0.5 MG tablet Take 0.5 mg by mouth daily.    . canagliflozin (INVOKANA) 100 MG TABS tablet Take 100 mg by mouth daily.    [name]  Hypromellose (NATURAL BALANCE TEARS) 0.4 % SOLN Apply 1 drop to eye every 6 (six) hours as needed (for dry eyes).    . Insulin Glargine (TOUJEO SOLOSTAR) 300 UNIT/ML SOPN Inject 60 Units into the skin daily.    [name] losartan (COZAAR) 25 MG tablet Take 25 mg by mouth daily.    . memantine (NAMENDA  XR) 28 MG CP24 24 hr capsule Take 28 mg by mouth daily.    . naproxen sodium (ANAPROX) 220 MG tablet Take 220 mg by mouth 2 (two) times daily with a meal.     No current facility-administered medications for this visit.    Functional Status: In your present state of health, do you have any difficulty performing the following activities: 07/03/2015 06/09/2015  Hearing? [place]? N -  Difficulty concentrating or making decisions? Tempie Donning  Walking or climbing stairs? Y -  Dressing or bathing? Y -  Doing errands, shopping? Y -  Conservation officer, nature and eating ? Y Y  Using the Toilet? N -  In the past six months, have you accidently leaked urine? N -  Do you have problems with loss of bowel control? N -  Managing your Medications? (No Data) -  Managing your Finances? Tempie Donning  Housekeeping or managing your Housekeeping? Tempie Donning    Fall/Depression Screening: PHQ 2/9 Scores 07/03/2015 05/27/2015  PHQ - 2 Score 0 1    Assessment: 1) Patient with improved blood sugars since using proper administration technique with his Toujeo. However, evening blood sugars elevated with controlled to low fasting blood sugars. Patient's diabetes medication regimen adjusted today by his PCP's office to try to optimize his regimen based on his current blood sugars and renal function following pharmacist recommendations.  2) Patient currently compliant to checking blood sugar twice daily.   Plan:  1) Patient to take Toujeo 60 units under the skin each night.  2) Patient to record his blood sugar twice daily in his log and call MD office or 24-hour nurse line over long holiday weekend if needed.  3) Patient to add Januvia 50 mg to his pillbox tonight and take daily.  4) Will follow up with Mr. Fury next week on 07/30/15.   [name], PharmD Clinical Pharmacist [place] Management [phone]

## 2015-07-25 NOTE — Patient Outreach (Addendum)
[facility]) Care Management   07/25/2015  Daniel [name] [birth year] [id number]  Daniel [name] is an 81 y.o. male  Subjective: "I have had some low sugars in the mornings and I get sweaty and I gotta have some orange juice."    Objective:   Review of Systems  Cardiovascular: Positive for leg swelling.  Musculoskeletal: Positive for back pain.    Physical Exam  Constitutional: He is oriented to person, place, and time. He appears well-developed and well-nourished.  Cardiovascular: Normal rate and regular rhythm.   Pulses:      Radial pulses are 2+ on the right side, and 2+ on the left side.       Dorsalis pedis pulses are 1+ on the right side, and 1+ on the left side.  Respiratory: Effort normal and breath sounds normal.  GI: Soft. Bowel sounds are normal.  Musculoskeletal: Normal range of motion.       Right lower leg: He exhibits edema.       Left lower leg: He exhibits edema.  +1 edema to bilateral lower legs  Neurological: He is alert and oriented to person, place, and time.  Skin: Skin is warm and dry.  Psychiatric: He has a normal mood and affect. His behavior is normal. Judgment and thought content normal.    Current Medications:   Current Outpatient Prescriptions  Medication Sig Dispense Refill  . amLODipine (NORVASC) 10 MG tablet Take 10 mg by mouth daily.    [name] atorvastatin (LIPITOR) 10 MG tablet Take 5 mg by mouth daily.    . bumetanide (BUMEX) 0.5 MG tablet Take 0.5 mg by mouth daily.    . canagliflozin (INVOKANA) 100 MG TABS tablet Take 100 mg by mouth daily.    . Hypromellose (NATURAL BALANCE TEARS) 0.4 % SOLN Apply 1 drop to eye every 6 (six) hours as needed (for dry eyes).    . Insulin Glargine (TOUJEO SOLOSTAR) 300 UNIT/ML SOPN Inject 60 Units into the skin daily.    [name] losartan (COZAAR) 25 MG tablet Take 25 mg by mouth daily.    . memantine (NAMENDA XR) 28 MG CP24 24 hr capsule Take 28 mg by mouth daily.    . naproxen sodium (ANAPROX) 220 MG  tablet Take 220 mg by mouth 2 (two) times daily with a meal.     No current facility-administered medications for this visit.    Functional Status:   In your present state of health, do you have any difficulty performing the following activities: 07/03/2015 06/09/2015  Hearing? [place]? N -  Difficulty concentrating or making decisions? Tempie Donning  Walking or climbing stairs? Y -  Dressing or bathing? Y -  Doing errands, shopping? Y -  Conservation officer, nature and eating ? Y Y  Using the Toilet? N -  In the past six months, have you accidently leaked urine? N -  Do you have problems with loss of bowel control? N -  Managing your Medications? (No Data) -  Managing your Finances? Tempie Donning  Housekeeping or managing your Housekeeping? Tempie Donning    Fall/Depression Screening:    PHQ 2/9 Scores 07/03/2015 05/27/2015  PHQ - 2 Score 0 1    Assessment: Co-visit with Pharmacist [name].  Spoke to the [facility] to confirm medication delivery and price of medications. Pharmacy confirms most meds will be delivered today at no cost. Insulin pen and Namenda will be delivered on Monday related to insurance. Pt phone number given  to pharmacy related to delivery person will need to call pt so he can hear delivery person at the door.  Patient made aware of delivery time and phone call plan. Pharmacist spoke with doctor's office to assist with blood sugar control. Grayland Ormond did teaching with pt about new medication and reduction in current insulin. Invokana taken out of pill organizer and medication taken out of the home to avoid confusion. Pharmacist confirmed with the pharmacy new medications had been called in and would be delivered tonight with other medications. New insulin dose 60units q pm and new medication added Januvia ordered by nurse practitioner.  RNCM used teach back to confirm pt understood new dose of insulin, and new medication name. Notes written and placed around apartment  indicating new doses and reminding pt to eat a bedtime snack and check sugars BID.   Foot assessment done dry skin noted, toe nails needed to be cut, skin intact not broken areas noted. Pt noted to beable to feel RNCM touching the bottom of bilateral feet. +1 bilateral pedal pulses. RNCM did teaching around foot care and assisted pt in making a podiatry appt for Jan 4 at 2:40. Pt unsure he will have transportation, but planned to find out if granddaughter can take him. RNCM will discuss this with SW. Pt instructed not to drive to [place] to appointment alone.  Plan:RNCM will touch base with pt next week about sugars and medications.  [name] RN, BSN  [facility] Care Management [phone])

## 2015-07-30 ENCOUNTER — Other Ambulatory Visit: Payer: Self-pay | Admitting: Pharmacist

## 2015-07-30 NOTE — Patient Outreach (Signed)
Called to follow up with Mr. Haxton to review the medications that he received and to make sure that he has put his insulin in the fridge. Mr. Shawhan states that he has put the box of insulin in his fridge. Let him know that his second box is to be delivered by his pharmacy this evening. Have patient read the names of each pill bottle that he received and confirm that patient has now received all of his medications. Let Mr. Viergutz know that I have asked his pharmacy, per his request, to not leave his medication, but to instead call his cell phone.   Remind Mr. Dumont that I will follow up with him again in 1 week to see how he is doing with his new medication, Januvia, and to check on his blood sugars.  [name], PharmD Clinical Pharmacist [place] Management [phone]

## 2015-07-30 NOTE — Patient Outreach (Signed)
Called to follow up with Mr. Decola about his blood sugar, Toujeo dosing, new prescription for Januvia and pharmacy delivery. Patient reports that he had a good holiday with his family. Patient reports that he has been injecting his Toujeo 60 units every evening as directed. Reports the following morning (fasting) and evening blood sugars from the past few days:  12/25: 107 264 12/26: 153  12/27:  224 252 **Reports that he might have eaten something overnight 12/28:  112  Patient reports that he is concerned because his new pharmacy, [facility], has only delivered some of his medications on Friday evening. Patient reports that he has not yet started taking the Januvia. Reports that he forgot that he needed to add this one to his pillbox. Patient locates this medication now and reports that he will fill this into his pillbox to take with his other medications, once daily, as soon as we get off of the phone.  PLAN:   1) Will follow up with patient's pharmacy, [facility] [phone]), to make sure that the rest of the patient's prescriptions will be delivered.  [name], PharmD Clinical Pharmacist [place] Management [phone]

## 2015-07-30 NOTE — Patient Outreach (Signed)
Call to follow up with patient's pharmacy, [facility] [phone]), to make sure that the rest of the patient's prescriptions will be delivered. Called and spoke with Anderson Malta and pharmacist Erasmo Downer at [facility]. Anderson Malta reports that the patient received his Januvia, losartan, atorvastatin, amlodipine, bumetanide Friday night. Reports that the driver delivered the Namenda and one box of the Toujeo to the patient last night. Reports that the pharmacy still owes the patient another box of the Toujeo, which will be delivered tonight. The Namenda and the Toujeo were both left in the patient's door by the driver. Erasmo Downer states that the temperatures overnight were cold enough for the Toujeo and did not fall below freezing. Express to Erasmo Downer concern about the pharmacy driver leaving medication for the patient in this way in the future. Patsy [place] that Nurse Care Manager Janci Minor had specifically asked that the driver call the patient on his cell phone as the patient cannot hear the door. Erasmo Downer reports that she will speak with her driver about being sure to do this in the future and will instruct the driver that for this patient to not leave medication on his doorstep in the future, as the patient will not know that it is there without a phone call and depending on the weather conditions, the medication may be exposed to temperatures outside of the recommended storage conditions.  Will call to follow up with the patient to review the medications that he received and make sure that he has put his insulin in the fridge.  [name], PharmD Clinical Pharmacist [place] Management [phone]

## 2015-07-31 ENCOUNTER — Other Ambulatory Visit: Payer: Self-pay | Admitting: *Deleted

## 2015-07-31 NOTE — Patient Outreach (Signed)
RNCM received a vm from [facility] stating pt had called in stating he was about to run out of needles to take his insulin, they had called the MD and was unable to obtain a new script for the needles.   RNCM called MD office and requested the script and nurse stated she would fax it right then.  RNCM called pt and made him aware of the situation. Pt stating he had 3 needles left but was worried about running out during the holiday weekend. RNCM asked pt aboout his current sugars and pt stated he had just started [place] yesterday and his sugar this am was 109. RNCM reinforced to pt he was to check his sugars in the am and pm, pt verbalized understanding. Pt requesting RNCM find somewhere to fix him some dentures related to gum removal. Pt stating he is having such a hard time eating.    RNCM called pharmacy and spoke with Ebony Hail, letting her know the script was being faxed and pt had 3 needles left. Requested [facility] call [facility] tomorrow if she had not received the fax for the needles.   Plan: Will await a return call if needed. RNCM will visit pt in 3 weeks to check on pt progress. RNCM will investigate pt options for dentures.   [name] RN, BSN  [facility] Care Management [phone])

## 2015-08-05 ENCOUNTER — Other Ambulatory Visit: Payer: Self-pay | Admitting: *Deleted

## 2015-08-05 NOTE — Patient Outreach (Signed)
RNCM received a call from pt stating he was unable to get in touch with [place] podiatrist and requested RNCM call and cancel appt. RNCM called [place] office and canceled appt per pt request. RNCM called and set pt up an appt with [facility] Podiatrist Dr. Sharlotte Alamo [phone]) on Jan 18 @ 2:15pm. Receptionist stated there were no earlier appointments. RNCM called pt and made him aware of his new appt and made pt aware RNCM and SW would assist him in learning how to use the [employer] transportation system to go to appointments. Pt thankful for RNCM's assistance in this matter, but was concerned that toenails would crack before he could make it to upcoming appt.  RNCM talked with pt about recent blood sugars while on the phone. Pt stated blood sugars were 117 yesterday morning, 128 last night. Pt reports blood sugar 112 Sunday am and 257 Sunday evening. Pt reports blood sugar 106 Saturday am and 103 Saturday evening. These are stated sugars and it was unclear if he was reporting correctly related to he kept going back and forth in the conversation about insulin, orange juice and what he had been eating. RNCM would not recommend changing any medication doses with reported values. Pt also continued to request RNCM look into any assistance available in pt obtaining new dentures.  Plan: RNCM will collaborate with [facility] SW to assist pt in obtaining transportation to upcoming podiatry appt.  RNCM will talk with NP on staff related to pt's toenails.  RNCM will attempt to find programs to assist with dentures.   [name] RN, BSN  [facility] Care Management [phone])

## 2015-08-05 NOTE — Patient Outreach (Signed)
Voicemail received by pt requesting RNCM assist pt with transportation to his podiatrist in [place] to he is unable to drive that far and his daughter had surgery for a broken leg. RNCM called [facility] care management assistant Damita to request transport and was made aware [facility] arranged transport is for primary care appts only. RNCM called [place] and talked with her about pt's options. RNCM then called pt and discussed options with him. Pt decided to cancel current podiatry appt and requested RNCM assist him in finding him a podiatrist in [place] where he could have [place] transport him. Pt planned to call and cancel current appt.  Plan: RNCM will assist pt in obtaining a podiatry appt in [place] and assist him with obtaining transportation.    [name] RN, BSN  [facility] Care Management [phone])

## 2015-08-07 ENCOUNTER — Other Ambulatory Visit: Payer: Self-pay | Admitting: *Deleted

## 2015-08-07 NOTE — Patient Outreach (Signed)
[facility]) Care Management  08/07/2015  Daniel [name] April 27, 1928 [id number]  Phone call to patient to schedule follow up home visit to assist with transportation needs.  Home visit scheduled 08/11/15 at 1:00pm.   [name] [facility] Care Management [phone]

## 2015-08-08 ENCOUNTER — Other Ambulatory Visit: Payer: Self-pay | Admitting: *Deleted

## 2015-08-08 ENCOUNTER — Ambulatory Visit: Payer: Medicare Other | Admitting: *Deleted

## 2015-08-08 ENCOUNTER — Other Ambulatory Visit: Payer: Self-pay | Admitting: Pharmacist

## 2015-08-08 NOTE — Patient Outreach (Signed)
Acute visit requested from co-worker, Janci Minor, RN for podiatry service by NP as pt cannot see podiatrist until end of the month and he is having trouble walking with his long nails now.  Diabetic Foot Exam - Simple   Simple Foot Form  Diabetic Foot exam was performed with the following findings:  Yes 08/07/2015 11:32 AM  Visual Inspection  No deformities, no ulcerations, no other skin breakdown bilaterally:  Yes  See comments:  Yes  Sensation Testing  Intact to touch and monofilament testing bilaterally:  Yes  Pulse Check  Posterior Tibialis and Dorsalis pulse intact bilaterally:  Yes  See comments:  Yes  Comments  Toenails are long and onychomycotic, especially his great toenails. Although he washed his feet before I came, there is still a lot of dead skin debris around the nails and in between his toes. He has 2-3 edema and shining skin on both lower extremities. Skin is cool to touch, moderate capillary refill.   Pt appreciated 9/10 monifiliment touch sites bilaterally. He does not readily appreciate the touch on the plantar surface of his heels.  Pulses are weak 1+  Deloria Lair [facility] Care Manager [phone]       I debrided his toenails and discussed making sure he gets to his usual podiatrist for this service every 3 months. I suggested he use a rough wash cloth to wash his feel and to scrub between his toes. Also I recommended he get some Vicks Vapor Rub and rub into his nails and feet to soften his nails and feet. Wear socks and shoes.  Deloria Lair [facility] [place] [phone]

## 2015-08-08 NOTE — Patient Outreach (Signed)
Called to follow up with Daniel [name] to see how he is doing with his new medication, Januvia, and to check on his blood sugars. Left a HIPAA compliant message on the patient's voicemail. If have not heard from patient by 08/11/15 will give him another call at that time.   [name], PharmD Clinical Pharmacist [place] Management [phone]

## 2015-08-11 ENCOUNTER — Ambulatory Visit: Payer: Self-pay | Admitting: *Deleted

## 2015-08-11 ENCOUNTER — Other Ambulatory Visit: Payer: Self-pay | Admitting: *Deleted

## 2015-08-11 ENCOUNTER — Other Ambulatory Visit: Payer: Self-pay | Admitting: Pharmacist

## 2015-08-11 NOTE — Patient Outreach (Signed)
[facility]) Care Management  08/11/2015  Daniel [name] August 27, 1927 [id number]   Phone call to patient to re-schedule home visit to 08/13/15 due to inclement weather.  Home visit re-scheduled to 08/12/14 at 12:00pm.  Patient states that he is out of testing strips.  This social worker recommended that patient call his pharmacy, [facility] to re-order the test strips.   [name] [facility] Care Management [phone]'

## 2015-08-11 NOTE — Patient Outreach (Signed)
Called to follow up with Daniel [name] about his new diabetes medication, Januvia, and his blood sugars. Patient tells me that he is doing well. Reports that he has been taking his Januvia and injecting his Toujeo nightly. Patient reports that his blood sugar this morning was 125 mg/dL. Reports that last night it was 298 mg/dL after supper. Try to ask patient about what he ate for supper and about blood sugars from previous days. However, patient realizes that he is running low on his test strips. Patient asks about how he gets more. Let patient know that Dr. Baird Cancer will need to call these into his pharmacy to be delivered to him. Patient begins to look for Daniel [name] phone number. Patient accurately reads this phone number to me once he has found it. Patient tells me that he has to go in order to call Dr. Baird Cancer now.  Will follow up with Daniel [name] next week to see how his blood sugar control is doing.  [name], PharmD Clinical Pharmacist [place] Management [phone]

## 2015-08-13 ENCOUNTER — Other Ambulatory Visit: Payer: Self-pay | Admitting: Pharmacist

## 2015-08-13 ENCOUNTER — Other Ambulatory Visit: Payer: Self-pay | Admitting: *Deleted

## 2015-08-13 ENCOUNTER — Encounter: Payer: Self-pay | Admitting: *Deleted

## 2015-08-13 NOTE — Patient Outreach (Signed)
[facility]) Care Management  [facility] Social Work  08/13/2015  Aldahir Gordillo [birth year] [id number]  Subjective:  Patient states that he is all out of testing strips, has one left that he is re-using but "It is not reading right"  :I have the medical alert system but I hope I don't have to use it"  Objective:   Current Medications:  Current Outpatient Prescriptions  Medication Sig Dispense Refill  . amLODipine (NORVASC) 10 MG tablet Take 10 mg by mouth daily.    [name] atorvastatin (LIPITOR) 10 MG tablet Take 5 mg by mouth daily.    . bumetanide (BUMEX) 0.5 MG tablet Take 0.5 mg by mouth daily.    . Hypromellose (NATURAL BALANCE TEARS) 0.4 % SOLN Apply 1 drop to eye every 6 (six) hours as needed (for dry eyes).    . Insulin Glargine (TOUJEO SOLOSTAR) 300 UNIT/ML SOPN Inject 60 Units into the skin daily.    [name] losartan (COZAAR) 25 MG tablet Take 25 mg by mouth daily.    . memantine (NAMENDA XR) 28 MG CP24 24 hr capsule Take 28 mg by mouth daily.    . naproxen sodium (ANAPROX) 220 MG tablet Take 220 mg by mouth 2 (two) times daily with a meal.    . sitaGLIPtin (JANUVIA) 50 MG tablet Take 50 mg by mouth daily. Reported on 07/30/2015     No current facility-administered medications for this visit.    Functional Status:  In your present state of health, do you have any difficulty performing the following activities: 07/03/2015 06/09/2015  Hearing? Dwight? N -  Difficulty concentrating or making decisions? Tempie Donning  Walking or climbing stairs? Y -  Dressing or bathing? Y -  Doing errands, shopping? Y -  Conservation officer, nature and eating ? Y Y  Using the Toilet? N -  In the past six months, have you accidently leaked urine? N -  Do you have problems with loss of bowel control? N -  Managing your Medications? (No Data) -  Managing your Finances? Tempie Donning  Housekeeping or managing your Housekeeping? Tempie Donning    Fall/Depression Screening:  PHQ 2/9 Scores 07/03/2015 05/27/2015  PHQ - 2 Score 0  1    Assessment:  Patient states that he is all out of test strips, he states he has one more left and has been re-using the same one.  This social worker [facility] the [facility], who states that they received the order for new test strips, however they do not carry the type of test strips for his monitor.  Phone call to Pharmacist Roselyn Bering, who states that patient has an older monitor that needs to be updated.  She will bring patient a new machine today and will provide the teaching on how to use the monitor.  Patient has his medical alert machine installed, patient's girlfriend continues to assist patient with grocery shopping and paying his bills.   Per patient, he continues to wash up at this time, does not take tub baths due to fear of falls.  Arranging a bath aid for a fat rate fee of $23.00 discussed through [facility], patient declines this option stating that that he did not want to pay the hourly rate.  Contact information provided in the future if he re-considers.  Plan:  Patient verbalizes no further social work needs at this time.  Case to be closed to social work.  RNCM to be notified.  [name] [facility] Care Management [phone]

## 2015-08-13 NOTE — Patient Outreach (Signed)
Received a phone call from Social Worker [name] letting me know that she is at the patient's home and he is currently out of test strips for his meter. Chrystal reports that she called the patient's pharmacy and was informed that the patient's PCP, Dr. Baird Cancer, did call in a prescription for the patient's test strips for his current Care Sens meter as per the patient's request from Monday. However, as this is an older meter, the pharmacy is unable to get these test strips. Let Chrystal know that I will bring the patient a new, TrueTrack meter today.   Present to patient's home. Chrystal is present in the home with the patient. Demonstrate to patient how to use his new meter and lancet device. After talking with the patient, have the patient demonstrate how to use the meter and lancet device as well. Counsel patient about the differences between this meter and his old, Care Sens, meter. Particularly emphasize the use of a code chip with this meter. Review with patient that he will need to insert the new code chip that comes with each new bottle of test strips each time he starts using a new bottle of test strips. Patient verbalizes understanding. Patient checks his blood sugar. It is currently 96 mg/dL. Patient records this in his log. Patient reports that he ate over 2 hours ago and is getting ready to have another meal. Let patient know that this meter comes with 10 free test strips and 10 free lancets. Encourage the patient to continue to check his blood sugar twice daily and record these in his log.  While in the patient's home, call the patient's PCP's office and speak with Tameika. Let Rush Barer know that I have provided the patient with the TrueTrack meter and request that the TrueTrack test strips and lancets be called into his pharmacy, [facility]. Rush Barer states that she will do this for the patient now.  While with the patient, review with him his current Toujeo dosing. Patient  reports that he is continuing to take 60 units daily. Also review patient's pillbox, which patient appears to have accurately filled with his medications, including his new prescription for Januvia.  Patient reports that he has no further questions for me at this time. Confirm that patient has my phone number.  PLAN:  1) Let patient know that I will follow up with Nurse Care Manager Janci Minor to let her know about his new meter as well. Will ask RNCM to follow up with patient's pharmacy tomorrow to ensure that pharmacy receives new prescriptions for test strips and lancets and will deliver it to the patient.  2) Will follow up with Mr. Panther again next week to confirm that he has no further questions about his new meter.  [name], PharmD Clinical Pharmacist [place] Management [phone]

## 2015-08-14 ENCOUNTER — Other Ambulatory Visit: Payer: Self-pay | Admitting: *Deleted

## 2015-08-14 DIAGNOSIS — N183 Chronic kidney disease, stage 3 (moderate): Secondary | ICD-10-CM | POA: Diagnosis not present

## 2015-08-14 DIAGNOSIS — I129 Hypertensive chronic kidney disease with stage 1 through stage 4 chronic kidney disease, or unspecified chronic kidney disease: Secondary | ICD-10-CM | POA: Diagnosis not present

## 2015-08-14 DIAGNOSIS — E1122 Type 2 diabetes mellitus with diabetic chronic kidney disease: Secondary | ICD-10-CM | POA: Diagnosis not present

## 2015-08-14 DIAGNOSIS — M79642 Pain in left hand: Secondary | ICD-10-CM | POA: Diagnosis not present

## 2015-08-14 DIAGNOSIS — N08 Glomerular disorders in diseases classified elsewhere: Secondary | ICD-10-CM | POA: Diagnosis not present

## 2015-08-14 NOTE — Patient Outreach (Signed)
RNCM called [facility] to f/u on pt's strips and lancets being called in. Pharmacy stated they had not received the order yet.   RNCM called primary care office and spoke with [facility] who stated she would give a message to Dr. Baird Cancer nurse. RNCM made Misty aware pt needed glucose monitoring strips and lancets for a True Track glucometer.    Plan: Will Make [facility]-Pharmacist know pharmacy had not received order as of today.   [name] RN, BSN  [facility] Care Management [phone])

## 2015-08-18 ENCOUNTER — Other Ambulatory Visit: Payer: Self-pay | Admitting: Pharmacist

## 2015-08-18 ENCOUNTER — Other Ambulatory Visit: Payer: Self-pay | Admitting: *Deleted

## 2015-08-18 NOTE — Patient Outreach (Signed)
Receive a message from [place] Minor that she called and spoke with the patient's pharmacy, [facility], and that the pharmacy has still not received the prescription for the True Track test strips and lancets that go with the patient's new meter. Speak with Misty in Dr. Baird Cancer' office who reports that she sees the two messages from myself and Janci requesting the strips and lancets to be called in, but cannot see where these were sent to the pharmacy.  Misty states that she will walk back to the nurse now to request that these be called into the pharmacy now. Confirm that these prescriptions will be sent to the patient's current pharmacy, [facility]. Ask that when the nurse calls these in, to ask to speak directly to a pharmacist so that the order will be received. Misty states that she will ask this of the nurse.  Will update RNCM of this discussion and will follow on 08/20/15 to make sure that the patient received the test strips and lancets.  [name], PharmD Clinical Pharmacist [place] Management [phone]

## 2015-08-18 NOTE — Patient Outreach (Signed)
RNCM made a phone call to pt's podiatry office and rescheduled pt appt to April 17, @ 11:15 am.  RNCM called pt to make him aware of appt, pt wrote appt down and read it back to [facility] to confirm. RNCM asked pt about sugars and pt stated he had not received new test strips or lancets from the pharmacy yet. RNCM let pt know she would check on this for him.   RNCM made a collaboration phone call to [facility] to ask about pt's test strips. Pharmacist offered to call the MD office related to this issue.   Plan: RNCM will await a return phone call from pharmacist so that [facility] can call pt back with updated plan on strips.   [name] RN, BSN  [facility] Care Management [phone])

## 2015-08-19 ENCOUNTER — Other Ambulatory Visit: Payer: Self-pay | Admitting: *Deleted

## 2015-08-19 NOTE — Patient Outreach (Signed)
RNCM picked up pt's glucose strips from pharmacy related to pharmacy stating they were unable to deliver these items. RNCM took stips for new meter to pt. While in the home discussed with pt home care providers who have a service to assist with a bath for approx 24.00$. Pt interested so RNCM called [facility] per pt request and spoke with April to give pt's referral information. Pt instructed that a nurse from their office would be in touch. Pt verbalized understanding. Assisted pt in the set up of monitor with new strips. Pt requesting info on affordable dentures. RNCM will look into resources and set up appt to visit pt for case closure.   Plan: RNCM will contact [facility] care management assistant for dental resources.   [name] RN, BSN  [facility] Care Management [phone])

## 2015-08-29 ENCOUNTER — Other Ambulatory Visit: Payer: Self-pay | Admitting: Pharmacist

## 2015-08-29 NOTE — Patient Outreach (Signed)
Called to follow up with Mr. Difranco about his medication management and blood sugar. Spoke with Mr. Countess who reports that he is doing well. Reports that he saw Dr. Baird Cancer last week. Reports that his thumb has been bothering him and that he showed it to her at that time and asked that he might be having gout. Reports that he had an episode of gout in his thumb in the past. Reports that Dr. Baird Cancer did some blood work, but that he has not heard back about his thumb. Advised patient to call Dr. Baird Cancer office to follow up about his thumb since it is still bothering him. Patient states that he will.  Patient reports that his blood sugars have been doing well, except when he eats something that he knows that he shouldn't. Reports that his fasting blood sugar this morning was 161 mg/dL. Reports that yesterday he had a fasting blood sugar of 121 mg/dL. Reports that in the evening his blood sugar was in the 300s, but that he knows that this was after he ate a couple of sandwiches. Counsel patient about limiting his carbohydrate intake at any one meal. Patient verbalizes understanding and states that sometimes he just forgets.  Patient reports that he has about 4 or 5 days left of his medications. Reports that he is expecting for his pharmacy to deliver these soon. Advise patient to call his pharmacy to make sure that these will be delivered. Note that patient has the number in his room, but provide patient with the number to [facility] [phone]). Patient states that he will call now when we get off of the phone.  PLAN:  1) Patient to follow up with Dr. Baird Cancer' office about the pain in his thumb.  2) Patient to follow up with [facility] [phone]) regarding his medication refills.  3) Will follow up with Mr. Dobmeier next week to confirm that he was able to get his refills.  [name], PharmD Clinical Pharmacist [place]  Management [phone]

## 2015-09-03 ENCOUNTER — Other Ambulatory Visit: Payer: Self-pay | Admitting: Pharmacist

## 2015-09-03 NOTE — Patient Outreach (Signed)
Called to follow up with Mr. Oriley to make sure that he was able to get his medications from [facility]. Patient reports that he did call the pharmacy and request all of his refills. However, reports that he only received 5 out of 6 of his medications. Reports that he is missing his long black pill.   Call patient's Pharmacy, [facility] [phone]), and speak with pharmacist Wynonia Lawman. Wynonia Lawman reports that they did deliver to the patient all of his medications, except his Namenda. Reports that the pharmacy had to order the [place]. Discuss with Wynonia Lawman how it is very confusing to the patient when he does not receive all of his medications at the same time. Wynonia Lawman reports that he will make sure that they keep this medication in-stock for the patient moving forward. Discuss with Wynonia Lawman the option of using bubble packaging for the patient. Wynonia Lawman confirms that this service is available and can be done for the patient for no charge. Wynonia Lawman states that the pharmacy can start doing this for the patient, but that they will need all of his current medication bottles back first before they can start.   Call patient back. Patient reports that he is currently not able to find the pill bottles that were just delivered this week. Patient states that he has a few days of medication filled into his pillbox right now. Patient reports that he is very frustrated that the pharmacy would send some bottles, but not all, at one time.   Talk with patient about the blister packaging available from this pharmacy. Patient is now agreeable to this plan. Let the patient know that the pharmacy is planning to deliver his Namenda to him this evening. Let patient know that in order for the pharmacy to do the blister packaging, they will need to get all of his current medication back. Patient states that this will be fine.  Remind patient that when he has a concern related to his pharmacy, it is very important for him  to call and talk to the pharmacy directly. Provide patient with examples. Patient verbalizes understanding.  Call Nurse Care Manager Janci Minor to coordinate care for this patient. Merlene Morse states that she can meet with the patient tomorrow morning at 8:30 AM to help him to sort out his pill bottles and initiate the blister packaging with his pharmacy.  Call Mr. Debord back to let him know that Merlene Morse will be at his home tomorrow at 8:30 AM. Mr. Wint expresses appreciation and states that he will leave all of his bottles out where they are, not discarding anything.  PLAN:  1) RNCM to follow up with Mr. Musa tomorrow morning to help him to find his current medication bottles and to initiate the blister packaging service with his pharmacy.  2) Will follow up with RNCM on 09/05/15.  [name], PharmD Clinical Pharmacist [place] Management [phone]

## 2015-09-04 ENCOUNTER — Other Ambulatory Visit: Payer: Self-pay | Admitting: *Deleted

## 2015-09-04 NOTE — Patient Outreach (Signed)
RNCM came to pt's home to to assist with medication. Pt agrees to blister packaging for all PO medications. Pt had misplaced most recent medications. RNCM assisted pt in finding medications, pt filled a weeks worth of meds in pill box with RNCM's supervision. RNCM  will take current meds to pharmacy for blister packaging. RNCM took meds to [facility] and gave medications to the girl in charge of blister packaging at the pharmacy. Made her aware pt had enough medications in his pill box to last him until next Thursday.   Plan: RNCM will call pt at the end of the week to confirm he has received blister pack and also understands packaging.    [name] RN, BSN  [facility] Care Management [phone])

## 2015-09-10 ENCOUNTER — Other Ambulatory Visit: Payer: Self-pay | Admitting: Pharmacist

## 2015-09-10 NOTE — Patient Outreach (Signed)
Called patient's pharmacy, [facility], to confirm that the patient will be receiving his delivery of the blister package of his medications. Note that Nurse Care Manager Janci Minor finalized this change to blister packaging with the pharmacy last week and let the pharmacy know that the patient would have enough medication at home until this Thursday.   Spoke with Daniel [name] in the pharmacy who confirmed that the patient will be receiving the first week of his blister packaged medications tomorrow afternoon. Confirmed with Daniel [name] that the patient will be called on his cell phone at the time of the delivery.   Will call to follow up with Daniel [name] now.  [name], PharmD Clinical Pharmacist [place] Management [phone]

## 2015-09-10 NOTE — Patient Outreach (Signed)
Call to follow up with Mr. Leaming to let him know that [facility] will be delivering the first week of his blister packaged medications tomorrow afternoon and that the pharmacist states that they will call him at the time of delivery. Mr. Thoreson confirms that he has medication in his pillbox for tomorrow and that he will start using this blister packaged medication on Friday.  Reports that he is continuing to take his Toujeo 60 units under the skin daily. Reports that his blood sugar has been good, 105 mg/dL this morning and 230 mg/dL last night.   Patient does report that he has just found out that he has bed bugs. Reports that he is having his home sprayed tomorrow morning.  Patient confirms that he has my phone number. Let him know that I will stop following him for now, but to call me with questions. Provide patient again with the phone number to his pharmacy, [facility]. Have patient circle this number and remind him to call this number when he needs refills of his medications. Patient verbalizes understanding. Will let Nurse Care Manager Janci Minor know that I am closing the pharmacy episode for the patient at this time.   [name], PharmD Clinical Pharmacist [place] Management [phone]

## 2015-09-25 ENCOUNTER — Emergency Department: Payer: Medicare Other

## 2015-09-25 ENCOUNTER — Observation Stay
Admission: EM | Admit: 2015-09-25 | Discharge: 2015-09-29 | Disposition: A | Discharge status: Home / Self Care | Payer: Medicare Other | Attending: Internal Medicine | Admitting: Internal Medicine

## 2015-09-25 DIAGNOSIS — M1991 Primary osteoarthritis, unspecified site: Secondary | ICD-10-CM | POA: Insufficient documentation

## 2015-09-25 DIAGNOSIS — S82831A Other fracture of upper and lower end of right fibula, initial encounter for closed fracture: Secondary | ICD-10-CM | POA: Insufficient documentation

## 2015-09-25 DIAGNOSIS — F0390 Unspecified dementia without behavioral disturbance: Secondary | ICD-10-CM | POA: Insufficient documentation

## 2015-09-25 DIAGNOSIS — Z885 Allergy status to narcotic agent status: Secondary | ICD-10-CM | POA: Insufficient documentation

## 2015-09-25 DIAGNOSIS — R918 Other nonspecific abnormal finding of lung field: Secondary | ICD-10-CM | POA: Diagnosis not present

## 2015-09-25 DIAGNOSIS — Z794 Long term (current) use of insulin: Secondary | ICD-10-CM | POA: Diagnosis not present

## 2015-09-25 DIAGNOSIS — E785 Hyperlipidemia, unspecified: Secondary | ICD-10-CM | POA: Diagnosis not present

## 2015-09-25 DIAGNOSIS — W19XXXA Unspecified fall, initial encounter: Secondary | ICD-10-CM | POA: Insufficient documentation

## 2015-09-25 DIAGNOSIS — S82409A Unspecified fracture of shaft of unspecified fibula, initial encounter for closed fracture: Secondary | ICD-10-CM

## 2015-09-25 DIAGNOSIS — R52 Pain, unspecified: Secondary | ICD-10-CM

## 2015-09-25 DIAGNOSIS — I129 Hypertensive chronic kidney disease with stage 1 through stage 4 chronic kidney disease, or unspecified chronic kidney disease: Secondary | ICD-10-CM | POA: Diagnosis not present

## 2015-09-25 DIAGNOSIS — S82201A Unspecified fracture of shaft of right tibia, initial encounter for closed fracture: Secondary | ICD-10-CM | POA: Diagnosis present

## 2015-09-25 DIAGNOSIS — S22059A Unspecified fracture of T5-T6 vertebra, initial encounter for closed fracture: Secondary | ICD-10-CM | POA: Insufficient documentation

## 2015-09-25 DIAGNOSIS — S82301A Unspecified fracture of lower end of right tibia, initial encounter for closed fracture: Secondary | ICD-10-CM | POA: Diagnosis not present

## 2015-09-25 DIAGNOSIS — S82401A Unspecified fracture of shaft of right fibula, initial encounter for closed fracture: Secondary | ICD-10-CM

## 2015-09-25 DIAGNOSIS — E11649 Type 2 diabetes mellitus with hypoglycemia without coma: Secondary | ICD-10-CM | POA: Diagnosis not present

## 2015-09-25 DIAGNOSIS — M546 Pain in thoracic spine: Secondary | ICD-10-CM | POA: Diagnosis not present

## 2015-09-25 DIAGNOSIS — E1122 Type 2 diabetes mellitus with diabetic chronic kidney disease: Secondary | ICD-10-CM | POA: Diagnosis not present

## 2015-09-25 DIAGNOSIS — J479 Bronchiectasis, uncomplicated: Secondary | ICD-10-CM | POA: Insufficient documentation

## 2015-09-25 DIAGNOSIS — S82391A Other fracture of lower end of right tibia, initial encounter for closed fracture: Secondary | ICD-10-CM | POA: Diagnosis not present

## 2015-09-25 DIAGNOSIS — N183 Chronic kidney disease, stage 3 (moderate): Secondary | ICD-10-CM | POA: Diagnosis not present

## 2015-09-25 DIAGNOSIS — S82209A Unspecified fracture of shaft of unspecified tibia, initial encounter for closed fracture: Secondary | ICD-10-CM | POA: Diagnosis present

## 2015-09-25 DIAGNOSIS — S82841A Displaced bimalleolar fracture of right lower leg, initial encounter for closed fracture: Secondary | ICD-10-CM | POA: Diagnosis not present

## 2015-09-25 DIAGNOSIS — S22058A Other fracture of T5-T6 vertebra, initial encounter for closed fracture: Secondary | ICD-10-CM | POA: Diagnosis not present

## 2015-09-25 DIAGNOSIS — M8580 Other specified disorders of bone density and structure, unspecified site: Secondary | ICD-10-CM | POA: Insufficient documentation

## 2015-09-25 MED ORDER — BUMETANIDE 1 MG PO TABS
0.5000 mg | ORAL_TABLET | Freq: Every day | ORAL | Status: DC
  Administered 2015-09-26 – 2015-09-29 (×4): 0.5 mg via ORAL
  Filled 2015-09-25 (×4): qty 1

## 2015-09-25 MED ORDER — LOSARTAN POTASSIUM 50 MG PO TABS
25.0000 mg | ORAL_TABLET | Freq: Every day | ORAL | Status: DC
  Administered 2015-09-26 – 2015-09-29 (×4): 25 mg via ORAL
  Filled 2015-09-25 (×4): qty 1

## 2015-09-25 MED ORDER — MEMANTINE HCL ER 14 MG PO CP24
28.0000 mg | ORAL_CAPSULE | Freq: Every day | ORAL | Status: DC
  Administered 2015-09-26 – 2015-09-29 (×4): 28 mg via ORAL
  Filled 2015-09-25 (×4): qty 2

## 2015-09-25 MED ORDER — TRAMADOL HCL 50 MG PO TABS
50.0000 mg | ORAL_TABLET | Freq: Once | ORAL | Status: AC
  Administered 2015-09-25: 50 mg via ORAL

## 2015-09-25 MED ORDER — LINAGLIPTIN 5 MG PO TABS
5.0000 mg | ORAL_TABLET | Freq: Every day | ORAL | Status: DC
  Administered 2015-09-26 – 2015-09-29 (×4): 5 mg via ORAL
  Filled 2015-09-25 (×4): qty 1

## 2015-09-25 MED ORDER — HYPROMELLOSE 0.4 % OP SOLN
1.0000 [drp] | Freq: Four times a day (QID) | OPHTHALMIC | Status: DC | PRN
  Filled 2015-09-25: qty 15

## 2015-09-25 MED ORDER — AMLODIPINE BESYLATE 10 MG PO TABS
10.0000 mg | ORAL_TABLET | Freq: Every day | ORAL | Status: DC
  Administered 2015-09-26 – 2015-09-29 (×4): 10 mg via ORAL
  Filled 2015-09-25 (×2): qty 1
  Filled 2015-09-25: qty 2
  Filled 2015-09-25: qty 1

## 2015-09-25 MED ORDER — INSULIN GLARGINE 300 UNIT/ML ~~LOC~~ SOPN: 60.0000 [IU] | PEN_INJECTOR | Freq: Every day | SUBCUTANEOUS | Status: DC

## 2015-09-25 MED ORDER — TRAMADOL HCL 50 MG PO TABS
ORAL_TABLET | ORAL | Status: AC
  Administered 2015-09-25: 50 mg via ORAL
  Filled 2015-09-25: qty 1

## 2015-09-25 MED ORDER — ATORVASTATIN CALCIUM 10 MG PO TABS
5.0000 mg | ORAL_TABLET | Freq: Every day | ORAL | Status: DC
  Administered 2015-09-26 – 2015-09-29 (×4): 5 mg via ORAL
  Filled 2015-09-25 (×2): qty 1
  Filled 2015-09-25: qty 0.5
  Filled 2015-09-25: qty 1

## 2015-09-25 NOTE — ED Provider Notes (Signed)
[facility] Emergency Department Provider Note ____________________________________________  Time seen: Approximately 8:11 PM  I have reviewed the triage vital signs and the nursing notes.   HISTORY  Chief Complaint Fall    HPI Daniel [name] is a 82 y.o. male who presents to the emergency department for evaluation of right foot and ankle pain after a mechanical, nonsyncopal fall. He states that his leg gave out while getting into the car and fell to the ground. He states that he did not strike his head or pass out. He also complains of back pain since the fall.   Past Medical History  Diagnosis Date  . Arthritis   . Diabetes mellitus without complication ([place])   . Dementia   . Renal disorder   . Hypertension     There are no active problems to display for this patient.   Past Surgical History  Procedure Laterality Date  . Cholecystectomy    . Back surgery    . Knee surgery    . Hernia repair    . Teeth pulled  Bilateral 08/13/14    teeth pulled and bottom gum removed    Current Outpatient Rx  Name  Route  Sig  Dispense  Refill  . amLODipine (NORVASC) 10 MG tablet   Oral   Take 10 mg by mouth daily.         [name] atorvastatin (LIPITOR) 10 MG tablet   Oral   Take 5 mg by mouth daily.         . bumetanide (BUMEX) 0.5 MG tablet   Oral   Take 0.5 mg by mouth daily.         . Hypromellose (NATURAL BALANCE TEARS) 0.4 % SOLN   Ophthalmic   Apply 1 drop to eye every 6 (six) hours as needed (for dry eyes).         . Insulin Glargine (TOUJEO SOLOSTAR) 300 UNIT/ML SOPN   Subcutaneous   Inject 60 Units into the skin daily.         [name] losartan (COZAAR) 25 MG tablet   Oral   Take 25 mg by mouth daily.         . memantine (NAMENDA XR) 28 MG CP24 24 hr capsule   Oral   Take 28 mg by mouth daily.         . naproxen sodium (ANAPROX) 220 MG tablet   Oral   Take 220 mg by mouth 2 (two) times daily with a meal.         . sitaGLIPtin  (JANUVIA) 50 MG tablet   Oral   Take 50 mg by mouth daily. Reported on 07/30/2015           Allergies Oxycodone  No family history on file.  Social History Social History  Substance Use Topics  . Smoking status: Never Smoker   . Smokeless tobacco: Not on file  . Alcohol Use: No    Review of Systems Constitutional: No recent illness. Eyes: No visual changes. Cardiovascular: Denies chest pain or palpitations. Respiratory: Denies shortness of breath. Gastrointestinal: No abdominal pain.  Musculoskeletal: Pain in right foot, ankle, and lower back. Skin: Negative for abrasion or laceration. Neurological: Negative for headaches, focal weakness or numbness.   ____________________________________________   PHYSICAL EXAM:  VITAL SIGNS: ED Triage Vitals  Enc Vitals Group     BP 09/25/15 1843 139/59 mmHg     Pulse Rate 09/25/15 1843 83     Resp 09/25/15 1843 18  Temp 09/25/15 1843 99 F (37.2 C)     Temp Source 09/25/15 1843 Oral     SpO2 09/25/15 1843 95 %     Weight 09/25/15 1843 257 lb (116.574 kg)     Height 09/25/15 1843 5\' 9"  (1.753 m)     Head Cir --      Peak Flow --      Pain Score 09/25/15 1851 5     Pain Loc --      Pain Edu? --      Excl. in [place]? --     Constitutional: Alert and oriented.  In no acute distress. Eyes: Conjunctivae are normal. EOMI. Head: Atraumatic. Neck: No stridor. Nexus criteria negative Respiratory: Normal respiratory effort.   Musculoskeletal: Midline tenderness of the lower thoracic and lumbar spine. Swelling and pain on exam of right foot and ankle. [place] ankle rules are positive.  Neurologic:  Normal speech and language. No gross focal neurologic deficits are appreciated. Speech is normal. Skin:  Skin is warm, dry and intact. Atraumatic. Psychiatric: Mood and affect are normal. Speech and behavior are normal.  ____________________________________________   LABS (all labs ordered are listed, but only abnormal results are  displayed)  Labs Reviewed - No data to display ____________________________________________  RADIOLOGY  Positive for tib fib fracture on the right.  I, Sherrie George, personally viewed and evaluated these images (plain radiographs) as part of my medical decision making, as well as reviewing the written report by the radiologist.  Nondisplaced, non comminuted fracture of the T5 vertebra per CT and radiology interpretation.  ____________________________________________   PROCEDURES  Procedure(s) performed:   SPLINT APPLICATION  Authorized by: Sherrie George Consent: Verbal consent obtained. Risks and benefits: risks, benefits and alternatives were discussed Consent given by: patient Splint applied by: Marcie Bal, ER technician Location details: right foot/ankle Splint type: stirrup  Supplies used: OCL and ACE Post-procedure: The splinted body part was neurovascularly unchanged following the procedure. Patient tolerance: Patient tolerated the procedure well with no immediate complications.      ____________________________________________   INITIAL IMPRESSION / ASSESSMENT AND PLAN / ED COURSE  Pertinent labs & imaging results that were available during my care of the patient were reviewed by me and considered in my medical decision making (see chart for details).  Pain continues despite tramadol. Patient lives alone and will not be able to care for himself due to pain and fractures. He will be boarded in the ER tonight to await social work consult and PT/OT consult with possible placement in rehabilitation facility. Patient and family agree on the plan.  He will be transferred to room 22 and care will be relinquished to Dr. Beather Arbour.  ----------------------------------------- 7:03 AM on 09/26/2015 -----------------------------------------  Patient remained in the emergency department overnight pending clinical social work consult this morning. In summary, this is a 82 year old  male who lives independently who presented to the ED s/p mechanical fall with nondisplaced, non-comminuted T5 vertebral fracture and right distal tib/fib fractures. Patient felt he could not care for himself adequately at home and his family felt like they could not care for him either. Patient is currently resting in no acute distress. I have ordered tramadol 50 mg every 6 hours as needed for pain. Care transferred to Dr. Marcelene Butte pending social work consultation today. ____________________________________________   FINAL CLINICAL IMPRESSION(S) / ED DIAGNOSES  Final diagnoses:  Closed fracture of fifth thoracic vertebra, unspecified fracture morphology, initial encounter ([place])  Tibia fracture, right, closed, initial encounter  Fibula fracture,  right, closed, initial encounter       Victorino Dike, FNP 09/25/15 [place], MD 09/26/15 [phone]

## 2015-09-25 NOTE — ED Notes (Signed)
Fell when getting into the car - rt ankle pain,

## 2015-09-25 NOTE — ED Notes (Signed)
Took 2 aleve pta

## 2015-09-26 ENCOUNTER — Encounter: Payer: Self-pay | Admitting: Internal Medicine

## 2015-09-26 DIAGNOSIS — S22058A Other fracture of T5-T6 vertebra, initial encounter for closed fracture: Secondary | ICD-10-CM | POA: Diagnosis not present

## 2015-09-26 DIAGNOSIS — I10 Essential (primary) hypertension: Secondary | ICD-10-CM | POA: Diagnosis not present

## 2015-09-26 DIAGNOSIS — E119 Type 2 diabetes mellitus without complications: Secondary | ICD-10-CM | POA: Diagnosis not present

## 2015-09-26 DIAGNOSIS — S82301A Unspecified fracture of lower end of right tibia, initial encounter for closed fracture: Secondary | ICD-10-CM | POA: Diagnosis not present

## 2015-09-26 DIAGNOSIS — S82201A Unspecified fracture of shaft of right tibia, initial encounter for closed fracture: Secondary | ICD-10-CM | POA: Diagnosis not present

## 2015-09-26 DIAGNOSIS — S82209A Unspecified fracture of shaft of unspecified tibia, initial encounter for closed fracture: Secondary | ICD-10-CM | POA: Diagnosis present

## 2015-09-26 DIAGNOSIS — S82409A Unspecified fracture of shaft of unspecified fibula, initial encounter for closed fracture: Secondary | ICD-10-CM

## 2015-09-26 LAB — CBC WITH DIFFERENTIAL/PLATELET
BASOS ABS: 0.1 10*3/uL (ref 0–0.1)
BASOS PCT: 1 %
EOS ABS: 0.1 10*3/uL (ref 0–0.7)
Eosinophils Relative: 1 %
HEMATOCRIT: 36.5 % — AB (ref 40.0–52.0)
Hemoglobin: 12.1 g/dL — ABNORMAL LOW (ref 13.0–18.0)
Lymphocytes Relative: 20 %
Lymphs Abs: 1.8 10*3/uL (ref 1.0–3.6)
MCH: 25.7 pg — ABNORMAL LOW (ref 26.0–34.0)
MCHC: 33.2 g/dL (ref 32.0–36.0)
MCV: 77.3 fL — ABNORMAL LOW (ref 80.0–100.0)
MONO ABS: 1.1 10*3/uL — AB (ref 0.2–1.0)
Monocytes Relative: 12 %
NEUTROS ABS: 5.8 10*3/uL (ref 1.4–6.5)
NEUTROS PCT: 66 %
Platelets: 170 10*3/uL (ref 150–440)
RBC: 4.73 MIL/uL (ref 4.40–5.90)
RDW: 14.6 % — AB (ref 11.5–14.5)
WBC: 8.8 10*3/uL (ref 3.8–10.6)

## 2015-09-26 LAB — COMPREHENSIVE METABOLIC PANEL
ALBUMIN: 3.8 g/dL (ref 3.5–5.0)
ALT: 15 U/L — ABNORMAL LOW (ref 17–63)
ANION GAP: 7 (ref 5–15)
AST: 21 U/L (ref 15–41)
Alkaline Phosphatase: 78 U/L (ref 38–126)
BILIRUBIN TOTAL: 1 mg/dL (ref 0.3–1.2)
BUN: 16 mg/dL (ref 6–20)
CHLORIDE: 104 mmol/L (ref 101–111)
CO2: 27 mmol/L (ref 22–32)
Calcium: 9.2 mg/dL (ref 8.9–10.3)
Creatinine, Ser: 1.17 mg/dL (ref 0.61–1.24)
GFR calc Af Amer: >60 mL/min (ref >60)
GFR calc non Af Amer: 55 mL/min — ABNORMAL LOW (ref >60)
GLUCOSE: 243 mg/dL — AB (ref 65–99)
POTASSIUM: 3.9 mmol/L (ref 3.5–5.1)
SODIUM: 138 mmol/L (ref 135–145)
Total Protein: 8 g/dL (ref 6.5–8.1)

## 2015-09-26 LAB — GLUCOSE, CAPILLARY
GLUCOSE-CAPILLARY: 155 mg/dL — AB (ref 65–99)
GLUCOSE-CAPILLARY: 249 mg/dL — AB (ref 65–99)
Glucose-Capillary: 237 mg/dL — ABNORMAL HIGH (ref 65–99)

## 2015-09-26 MED ORDER — TRAMADOL HCL 50 MG PO TABS
50.0000 mg | ORAL_TABLET | Freq: Four times a day (QID) | ORAL | Status: DC
  Administered 2015-09-26 – 2015-09-29 (×12): 50 mg via ORAL
  Filled 2015-09-26 (×12): qty 1

## 2015-09-26 MED ORDER — ENOXAPARIN SODIUM 40 MG/0.4ML ~~LOC~~ SOLN
40.0000 mg | SUBCUTANEOUS | Status: DC
  Administered 2015-09-27 – 2015-09-28 (×2): 40 mg via SUBCUTANEOUS
  Filled 2015-09-26 (×2): qty 0.4

## 2015-09-26 MED ORDER — INSULIN ASPART 100 UNIT/ML ~~LOC~~ SOLN
0.0000 [IU] | Freq: Three times a day (TID) | SUBCUTANEOUS | Status: DC
  Administered 2015-09-27 – 2015-09-29 (×3): 2 [IU] via SUBCUTANEOUS
  Administered 2015-09-29: 1 [IU] via SUBCUTANEOUS
  Filled 2015-09-26 (×2): qty 2
  Filled 2015-09-26: qty 1
  Filled 2015-09-26 (×2): qty 2

## 2015-09-26 MED ORDER — INSULIN GLARGINE 100 UNIT/ML ~~LOC~~ SOLN
60.0000 [IU] | Freq: Every day | SUBCUTANEOUS | Status: DC
  Administered 2015-09-26 – 2015-09-27 (×2): 60 [IU] via SUBCUTANEOUS
  Filled 2015-09-26 (×4): qty 0.6

## 2015-09-26 MED ORDER — TRAMADOL HCL 50 MG PO TABS
ORAL_TABLET | ORAL | Status: AC
  Administered 2015-09-26: 50 mg via ORAL
  Filled 2015-09-26: qty 1

## 2015-09-26 MED ORDER — INSULIN ASPART 100 UNIT/ML ~~LOC~~ SOLN
0.0000 [IU] | Freq: Every day | SUBCUTANEOUS | Status: DC
  Administered 2015-09-26: 2 [IU] via SUBCUTANEOUS

## 2015-09-26 NOTE — H&P (Signed)
[facility] NAME: Daniel [name]    MR#:  [id number]  DATE OF BIRTH:  01-04-1928  DATE OF ADMISSION:  09/25/2015  PRIMARY CARE PHYSICIAN: Maximino Greenland, MD   REQUESTING/REFERRING PHYSICIAN: Dr. Meade Maw  CHIEF COMPLAINT:   Chief Complaint  Patient presents with  . Fall    HISTORY OF PRESENT ILLNESS:  Daniel [name]  is a 82 y.o. male with a known history of hypertension, diabetes. He presents after having a fall yesterday. He is having 5 out of 10 pain in his mid back. 3 out of 10 pain in his right ankle. He states that he was trying to get into his car after getting a haircut when his right leg gave out and he fell and hit his back and his leg. He could not get up. In the ER he was found to have a right distal tibia and fibula fracture. He was also found to have a T5 fracture. Physical therapy saw him in the ER and recommended rehabilitation. The patient lives alone and is unable to bear weight on his right leg secondary to the fracture. He does not have anyone that can look in on him at this point. It was believed to be an unsafe discharge home and hospitalist services were contacted for observation.  PAST MEDICAL HISTORY:   Past Medical History  Diagnosis Date  . Arthritis   . Diabetes mellitus without complication ([place])   . Dementia   . Renal disorder   . Hypertension     PAST SURGICAL HISTORY:   Past Surgical History  Procedure Laterality Date  . Cholecystectomy    . Back surgery    . Knee surgery    . Hernia repair    . Teeth pulled  Bilateral 08/13/14    teeth pulled and bottom gum removed    SOCIAL HISTORY:   Social History  Substance Use Topics  . Smoking status: Never Smoker   . Smokeless tobacco: Not on file  . Alcohol Use: No    FAMILY HISTORY:   Family History  Problem Relation Age of Onset  . Hypertension Father     DRUG ALLERGIES:   Allergies  Allergen Reactions  .  Oxycodone Nausea And Vomiting    REVIEW OF SYSTEMS:  CONSTITUTIONAL: No fever, fatigue or weakness.  EYES: No blurred or double vision. Does not wears glasses EARS, NOSE, AND THROAT: No tinnitus or ear pain. No sore throat RESPIRATORY: Some cough, no shortness of breath, wheezing or hemoptysis.  CARDIOVASCULAR: No chest pain, positive edema.  GASTROINTESTINAL: No nausea, vomiting, diarrhea or abdominal pain. No blood in bowel movements. Occasional constipation GENITOURINARY: No dysuria, hematuria.  ENDOCRINE: No polyuria, nocturia,  HEMATOLOGY: No anemia, easy bruising or bleeding SKIN: No rash or lesion. MUSCULOSKELETAL: No joint pain or arthritis.   NEUROLOGIC: No tingling, numbness, weakness.  PSYCHIATRY: No anxiety or depression.   MEDICATIONS AT HOME:   Prior to Admission medications   Medication Sig Start Date End Date Taking? Authorizing Provider  amLODipine (NORVASC) 10 MG tablet Take 10 mg by mouth daily.   Yes Historical Provider, MD  atorvastatin (LIPITOR) 10 MG tablet Take 5 mg by mouth daily.   Yes Historical Provider, MD  bumetanide (BUMEX) 0.5 MG tablet Take 0.5 mg by mouth daily.   Yes Historical Provider, MD  Hypromellose (NATURAL BALANCE TEARS) 0.4 % SOLN Apply 1 drop to eye every 6 (six) hours as needed (for dry eyes).  Yes Historical Provider, MD  Insulin Glargine (TOUJEO SOLOSTAR) 300 UNIT/ML SOPN Inject 60 Units into the skin daily.   Yes Historical Provider, MD  losartan (COZAAR) 25 MG tablet Take 25 mg by mouth daily.   Yes Historical Provider, MD  memantine (NAMENDA XR) 28 MG CP24 24 hr capsule Take 28 mg by mouth daily.   Yes Historical Provider, MD  sitaGLIPtin (JANUVIA) 50 MG tablet Take 50 mg by mouth daily. Reported on 07/30/2015   Yes Historical Provider, MD      VITAL SIGNS:  Blood pressure 151/75, pulse 81, temperature 97.8 F (36.6 C), temperature source Oral, resp. rate 18, height 5\' 9"  (1.753 m), weight 116.574 kg (257 lb), SpO2 97  %.  PHYSICAL EXAMINATION:  GENERAL:  82 y.o.-year-old patient lying in the bed with no acute distress.  EYES: Pupils equal, round, reactive to light and accommodation. No scleral icterus. Extraocular muscles intact.  HEENT: Head atraumatic, normocephalic. Oropharynx and nasopharynx clear.  NECK:  Supple, no jugular venous distention. No thyroid enlargement, no tenderness.  LUNGS: Normal breath sounds bilaterally, no wheezing, rales,rhonchi or crepitation. No use of accessory muscles of respiration.  CARDIOVASCULAR: S1, S2 normal. No murmurs, rubs, or gallops.  ABDOMEN: Soft, nontender, nondistended. Bowel sounds present. No organomegaly or mass.  EXTREMITIES: No pedal edema, cyanosis, or clubbing.  NEUROLOGIC: Cranial nerves II through XII are intact. Muscle strength 5/5 in all extremities. Sensation intact. Gait not checked.  PSYCHIATRIC: The patient is alert and oriented x 3.  SKIN: No rash, lesion, or ulcer.   LABORATORY PANEL:   No labs drawn on this patient  RADIOLOGY:  Dg Thoracic Spine 2 View  09/25/2015  CLINICAL DATA:  Upper thoracic back pain after fall from standing trying to get into car today. EXAM: THORACIC SPINE 2 VIEWS COMPARISON:  None. FINDINGS: Questionable linear lucency through tentatively identified T5 vertebral body. This may be artifactual and does not have a typical fracture appearance, there is no significant loss of vertebral body height. The remainder of the thoracic spine is intact without acute fracture. Multilevel flowing anterior osteophytes consistent with diffuse idiopathic skeletal hyperostosis (DISH). IMPRESSION: Questionable nondisplaced fracture involving (tentative) T5 vertebral body versus artifact. CT recommended. Otherwise no evidence of fracture. Electronically Signed   By: Jeb Levering M.D.   On: 09/25/2015 20:48   Dg Ankle Complete Right  09/25/2015  CLINICAL DATA:  Right ankle pain after falling from car. EXAM: RIGHT ANKLE - COMPLETE 3+ VIEW  COMPARISON:  None. FINDINGS: The bones are demineralized. There are acute mildly displaced fractures of the distal tibia and fibula. There is an oblique fracture of the base of the medial malleolus. The posterior tibial plafond appears grossly intact. There is an oblique fracture of the distal fibula extending superiorly from the ankle joint. There is no widening of the ankle mortise. The talus is located. No tarsal bone fractures are seen. There is moderate soft tissue swelling throughout the lower leg with a probable ankle joint effusion. IMPRESSION: Mildly displaced fractures of the distal tibia and fibula as described. No widening of the ankle mortise or dislocation. Electronically Signed   By: Richardean Sale M.D.   On: 09/25/2015 19:20   Ct Thoracic Spine Wo Contrast  09/25/2015  CLINICAL DATA:  Patient states fell when getting into the car this evening and landed on his back. C/o upper back and rt ankle pain. Denies loc. Subtle evidence of a fracture on the current thoracic radiographs. EXAM: CT THORACIC SPINE WITHOUT CONTRAST TECHNIQUE:  Multidetector CT imaging of the thoracic spine was performed without intravenous contrast administration. Multiplanar CT image reconstructions were also generated. COMPARISON:  Thoracic radiographs obtained this evening. FINDINGS: As noted on the current radiographs, there is a fracture of the T5 vertebra. There is an oblique nondisplaced fracture the extends across the vertebral body from the right posterior lower endplate the anterior upper endplate cross to left anterior corner of the vertebra. There is no involvement of the spinal canal or posterior elements. There is mild associated soft tissue edema. No other fractures. There is no spondylolisthesis. Degenerative spurring is noted most evident along the mid and lower thoracic spine where there are bridging anterior osteophytes. There is loss of disc height throughout the thoracic spine. The bones are diffusely  demineralized. Visualize lungs, and there some ground-glass opacity most evident the right lower lobe, linear and reticular opacities mild lower lobe bronchiectasis. No pleural effusion. There changes from a previous left thyroid lobectomy. IMPRESSION: 1. Nondisplaced, non comminuted fracture of the T5 vertebra. This does not involve the spinal canal or posterior elements. Fracture does extend from posterior aspect of the vertebral body to its anterior aspect. 2. No other fractures.  No spondylolisthesis. Electronically Signed   By: Lajean Manes M.D.   On: 09/25/2015 21:44    EKG:   No EKG done  IMPRESSION AND PLAN:   1. Closed distal tibia and fibula fracture. Patient also has a fracture of T5. Admit as observation. When necessary tramadol. Physical therapy evaluation with nonweightbearing on the right leg. Orthopedic consultation. 2. Type 2 diabetes on Lantus and Januvia. We'll put on sliding scale and draw labs 3. Essential hypertension- continue his usual medications 4. Chronic kidney disease stage III on prior labs. Check a BMP 5. Hyperlipidemia unspecified on atorvastatin   All the records are reviewed and case discussed with ED provider. Management plans discussed with the patient, family and they are in agreement.  CODE STATUS: full code  TOTAL TIME TAKING CARE OF THIS PATIENT: 50 CBC minutes.    Loletha Grayer M.D on 09/26/2015 at 4:19 PM  Between 7am to 6pm - Pager - [phone]  After 6pm call admission pager [facility]  Office  [phone]  CC: Primary care physician; Maximino Greenland, MD

## 2015-09-26 NOTE — ED Notes (Signed)
MD at bedside. 

## 2015-09-26 NOTE — Clinical Social Work Note (Signed)
Clinical Social Work Assessment  Patient Details  Name: Daniel [name] MRN: [id number] Date of Birth: [birth year]  Date of referral:  09/26/15               Reason for consult:  Other (Comment Required), Discharge Planning                Permission sought to share information with:  Family Supports Permission granted to share information::  Yes, Verbal Permission Granted  Name::      (daughter Maudie Mercury and friend Writer  )  Agency::     Relationship::     Contact Information:     Housing/Transportation Living arrangements for the past 2 months:  Apartment Source of Information:  Patient, Adult Children Patient Interpreter Needed:  None Criminal Activity/Legal Involvement Pertinent to Current Situation/Hospitalization:    Significant Relationships:  Friend Lives with:  Self Do you feel safe going back to the place where you live?  Yes Need for family participation in patient care:  Yes (Comment) (friend and daughter)  Care giving concerns:  Patient states unable to walk and lives home alone.   Social Worker assessment / plan:  Patient fell getting into his male friend's car. Has Tib and Fib fracture , unable to walk.  Spoke with MD and RN care manager, patient does not meet criteria for admission.  Patient's insurance is Med A/B and will require a three-night stay in order to pay for SNF.    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  Is alert and oriented x4.  Patient's wife died in 11/21/92, he has 8 children one daughter Maudie Mercury lives local.  States his support system is his male friend Rosetta who also assist with paying his bills and taking him to the store.  Per patient " I was doing good before this fall".  States uses cane and walker when at home.  However, he does have a wheelchair at his daughter's home. CSW explained to patient that he does not meet inpatient criteria.  Patient states he is ok to go home, would like CSW to call his daughter and friend to  coordinate him going home.  Call to patient's daughter, states she is unable to care for patient or check in on him.  Says patient spent almost a year at [facility] but signed himself out and got an apartment.  States "he really needs to go back to [facility], but says he don't want them taking his money".   Per daughter, she is unable to offer any supportive services for patient.  Has spoken to her other siblings and they too cannot assist.    CSW discussed private pay with patient.  He is not in agreement with private pay states he has bills to pay at home and unable to pay for rehab.  Patient does not want to go to STR.   CSW spoke to patient's friend who has the key to his apartment.  She works, is only able to check on patient twice a week.  Will go to home to get patient settled in when he is discharged.   PT consult pending at this time.  Looks like the only option would be for patient to discharge home with home health and possible follow up with [facility] when patient discharges if no one is able to stay with him.   Employment status:  Retired (Administrator) Forensic scientist:    PT Recommendations:  Not assessed at this time Information / Referral to community resources:  Other (Comment Required) (home health support and resources)  Patient/Family's Response to care:  Patient was appreciative of talking with CSW to assist with getting him home.    Patient/Family's Understanding of and Emotional Response to Diagnosis, Current Treatment, and Prognosis:  Patient understands he will not be admitted, is waiting on a PT consult and will prob discharge home with home health.  He also understands that CSW may need to make a APS referral.   Emotional Assessment Appearance:  Appears stated age Attitude/Demeanor/Rapport:    Affect (typically observed):  Accepting, Adaptable, Pleasant, Calm Orientation:  Oriented to Self, Oriented to Place, Oriented to  Time, Oriented to  Situation Alcohol / Substance use:    Psych involvement (Current and /or in the community):  No (Comment)  Discharge Needs  Concerns to be addressed:    Readmission within the last 30 days:    Current discharge risk:  Chronically ill, Dependent with Mobility, Lives alone, Lack of support system Barriers to Discharge:  Unsafe home situation, Other (PT consult pending / unsafe home due to living alone and unable to walk)   Maurine Cane, [name] 09/26/2015, 11:35 AM

## 2015-09-26 NOTE — ED Notes (Signed)
Physical Therapy  in with pt at present

## 2015-09-26 NOTE — Care Management Obs Status (Signed)
[facility] NOTIFICATION   Patient Details  Name: Daniel [name] MRN: [id number] Date of Birth: 04/20/1928   Medicare Observation Status Notification Given:  Yes    Ival Bible, RN 09/26/2015, 5:12 PM

## 2015-09-26 NOTE — ED Provider Notes (Signed)
-----------------------------------------   3:50 PM on 09/26/2015 -----------------------------------------   Blood pressure 151/75, pulse 81, temperature 97.8 F (36.6 C), temperature source Oral, resp. rate 18, height 5\' 9"  (1.753 m), weight 257 lb (116.574 kg), SpO2 97 %.  Assuming care from Dr. Dolphus Jenny.  In short, Daniel [name] is a 82 y.o. male with a chief complaint of Fall .  Refer to the original H&P for additional details.  The current plan of care is to follow the consultations. Patient's been seen by social work along with 5 hospital admissions, etc. Patient had a physical therapy consult and was advised to not discharged patient at this time. The patient's x-ray findings were reviewed with Dr. Rudene Christians orthopedics who agreed that he was not immediately a surgical issue and Aleve the posterior splint in place for the time being. He should be nonweightbearing. The patient apparently has limited resources at home and lives by himself. The patient's case was reviewed that with the hospitalist team for hopefully observation and possible transition to rehabilitation center.  Daymon Larsen, MD 09/26/15 [phone]

## 2015-09-26 NOTE — Progress Notes (Signed)
Patient is going to medical floor for observation. CSW will continue to follow patient and assist with developing a safe disposition plan for him.    Daniel [name]. [place] Work Department [phone] 5:24 PM

## 2015-09-26 NOTE — Evaluation (Signed)
Physical Therapy Evaluation Patient Details Name: Daniel [name] MRN: [id number] DOB: September 27, 1927 Today's Date: 09/26/2015   History of Present Illness  Daniel [name] is a 82 y.o. male who presents to the emergency department for evaluation of right foot and ankle pain after a mechanical, nonsyncopal fall. He states that his leg gave out while getting into the car and fell to the ground. He states that he did not strike his head or pass out. He also complains of back pain since the fall. Radiographs revealed minimally displacced R tibia and fibula fractures. T-spine CT showed T5 vertebral fractures. Orthopedics has not been consulted. Pt reports that his R leg has been his "good leg."   Clinical Impression  PT consulted for patient with minimally displaced R tibia/fibula fractures as well as T5 fracture. No orthopedic consult currently and no WB status so pt maintained NWB RLE per standard of care. Pt is severely weak with all attempts at mobility during evaluation. He requires modA+1 for both phases of bed mobility and maxA+1 with 3 attempts to go from sitting to standing. Once upright in standing pt has difficulty maintaining NWB on RLE. He is unable to clear LLE from ground with attempts in standing prior to ambulation. Pt unable to ambulate at this time. Pt lives in an apartment alone and has 3 steps to enter. He reports increase in back pain with all mobility. Pt received pain medication by RN during PT evaluation. Pt is unsafe/unable to discharge home at this time. He will need SNF placement to ensure safe return to prior level of function. Conversation regarding evaluation with MD and CSW. Will follow patient. Would think that he may benefit from ortho consult during hospital stay to review plan of care with respect to RLE and T5 fractures. Pt will benefit from skilled PT services to address deficits in strength, balance, and mobility in order to return to full function at home.     Follow Up  Recommendations SNF    Equipment Recommendations  None recommended by PT    Recommendations for Other Services Rehab consult     Precautions / Restrictions Precautions Precautions: Fall Restrictions Weight Bearing Restrictions: Yes RLE Weight Bearing: Non weight bearing      Mobility  Bed Mobility Overal bed mobility: Needs Assistance Bed Mobility: Supine to Sit;Sit to Supine     Supine to sit: Mod assist Sit to supine: Mod assist   General bed mobility comments: Pt requires assist for bilateral LEs as well as trunk with both phases of bed mobility. Would ideally perform log roll but bed is not large encough and pt is at risk for falling off bed. Pt reports increase in thoracic pain with bed mobility. Grossly weak and painful with bed mobility  Transfers Overall transfer level: Needs assistance Equipment used: Rolling walker (2 wheeled) Transfers: Sit to/from Stand Sit to Stand: Max assist         General transfer comment: MaxA+1 with 3 attempt requires in order to come to standing. Once upright pt is able to remain standing with heavy UE support on walker. Pt requires repeated cues to avoid WB through RLE. Eventually placed therapist foot under R foot to ensure no weight transfer. Pt unable to clear LLE from ground with repeated attempts in standing. Unable to ambulate at this time.  Ambulation/Gait                Science writer  Modified Rankin (Stroke Patients Only)       Balance Overall balance assessment: Needs assistance Sitting-balance support: No upper extremity supported Sitting balance-Leahy Scale: Fair     Standing balance support: Bilateral upper extremity supported Standing balance-Leahy Scale: Poor                               Pertinent Vitals/Pain Pain Assessment: 0-10 Pain Score: 4  Pain Location: 4/10 thoracic pain. Pt denies RLE pain because "I had some pain medication." Pain  Intervention(s): Monitored during session;Limited activity within patient's tolerance;RN gave pain meds during session    [place] expects to be discharged to:: Unsure Living Arrangements: Alone Available Help at Discharge: Other (Comment) (Unclear, appears that family/freinds can provide min assist) Type of Home: Apartment Home Access: Stairs to enter Entrance Stairs-Rails: None (Pt reports he can "hold onto a pole") Entrance Stairs-Number of Steps: 3 Home Layout: One level Home Equipment: Walker - 2 wheels;Walker - 4 wheels;Cane - single point;Shower seat (No grab bars, no BSC. Pt denies having a wheelchair) Additional Comments: Pt lives     Prior Function Level of Independence: Needs assistance   Gait / Transfers Assistance Needed: Pt reports independent ambulation at home with rollator and with single point cane when in community  ADL's / Homemaking Assistance Needed: Independent with bathing/dressing. Pt states, "I can't wash my back by myself." Has a friend how assist with groceries and meals. Pt states that he was driving up until 1-2 weeks ago        Hand Dominance   Dominant Hand: Right    Extremity/Trunk Assessment   Upper Extremity Assessment: RUE deficits/detail RUE Deficits / Details: Severe loss of bilateral shoulder flexion/abduction AROM which is chronic prior to admission. R grip strength appears mildly diminished. Elbow flexion/extension strength appears grossly WFL bilaterally         Lower Extremity Assessment: RLE deficits/detail RLE Deficits / Details: LLE strength and AROM appear grossly WFL. At least 4+/5 throughout. R leg is immobilized in posterior splint from below knee to midfoot. Pt able to move toes of R foot and reports full intact sensation to light touch. Pt able to fully extend and flex R knee but with increased pain. R hip flexion 4-/5.       Communication   Communication: No difficulties  Cognition Arousal/Alertness:  Awake/alert Behavior During Therapy: WFL for tasks assessed/performed Overall Cognitive Status: Within Functional Limits for tasks assessed (AOx4)                      General Comments      Exercises        Assessment/Plan    PT Assessment Patient needs continued PT services  PT Diagnosis Difficulty walking;Generalized weakness;Acute pain   PT Problem List Decreased strength;Decreased balance;Decreased mobility;Decreased knowledge of precautions;Pain;Obesity  PT Treatment Interventions DME instruction;Gait training;Stair training;Therapeutic activities;Functional mobility training;Therapeutic exercise;Balance training;Neuromuscular re-education;Patient/family education;Wheelchair mobility training;Manual techniques   PT Goals (Current goals can be found in the Care Plan section) Acute Rehab PT Goals Patient Stated Goal: Return to prior level of function PT Goal Formulation: With patient Time For Goal Achievement: 10/10/15 Potential to Achieve Goals: Fair    Frequency 7X/week   Barriers to discharge Inaccessible home environment;Decreased caregiver support 3 steps to enter, lives alone    Co-evaluation               End of Session Equipment  Utilized During Treatment: Gait belt Activity Tolerance: Patient tolerated treatment well;Other (comment) (Increase in pain but does not limit session) Patient left: in bed;with call bell/phone within reach;Other (comment) Freight forwarder outside door) Nurse Communication: Mobility status (Communicated status to MD)    Functional Assessment Tool Used: clinical judgement Functional Limitation: Mobility: Walking and moving around Mobility: Walking and Moving Around Current Status [id number]): At least 80 percent but less than 100 percent impaired, limited or restricted Mobility: Walking and Moving Around Goal Status [phone]): At least 40 percent but less than 60 percent impaired, limited or restricted    Time: 1130-1148 PT Time  Calculation (min) (ACUTE ONLY): 18 min   Charges:   PT Evaluation $PT Eval Moderate Complexity: 1 Procedure     PT G Codes:   PT G-Codes **NOT FOR INPATIENT CLASS** Functional Assessment Tool Used: clinical judgement Functional Limitation: Mobility: Walking and moving around Mobility: Walking and Moving Around Current Status [id number]): At least 80 percent but less than 100 percent impaired, limited or restricted Mobility: Walking and Moving Around Goal Status [phone]): At least 40 percent but less than 60 percent impaired, limited or restricted   Phillips Grout PT, DPT   Huprich,Jason 09/26/2015, 12:28 PM

## 2015-09-27 DIAGNOSIS — I10 Essential (primary) hypertension: Secondary | ICD-10-CM | POA: Diagnosis not present

## 2015-09-27 DIAGNOSIS — S82401A Unspecified fracture of shaft of right fibula, initial encounter for closed fracture: Secondary | ICD-10-CM | POA: Diagnosis not present

## 2015-09-27 DIAGNOSIS — S82301A Unspecified fracture of lower end of right tibia, initial encounter for closed fracture: Secondary | ICD-10-CM | POA: Diagnosis not present

## 2015-09-27 DIAGNOSIS — E119 Type 2 diabetes mellitus without complications: Secondary | ICD-10-CM | POA: Diagnosis not present

## 2015-09-27 DIAGNOSIS — S22058A Other fracture of T5-T6 vertebra, initial encounter for closed fracture: Secondary | ICD-10-CM | POA: Diagnosis not present

## 2015-09-27 DIAGNOSIS — S82201A Unspecified fracture of shaft of right tibia, initial encounter for closed fracture: Secondary | ICD-10-CM | POA: Diagnosis not present

## 2015-09-27 DIAGNOSIS — E162 Hypoglycemia, unspecified: Secondary | ICD-10-CM | POA: Diagnosis not present

## 2015-09-27 LAB — GLUCOSE, CAPILLARY
GLUCOSE-CAPILLARY: 101 mg/dL — AB (ref 65–99)
Glucose-Capillary: 79 mg/dL (ref 65–99)
Glucose-Capillary: 194 mg/dL — ABNORMAL HIGH (ref 65–99)
Glucose-Capillary: 74 mg/dL (ref 65–99)

## 2015-09-27 MED ORDER — KETOROLAC TROMETHAMINE 15 MG/ML IJ SOLN
15.0000 mg | Freq: Four times a day (QID) | INTRAMUSCULAR | Status: DC | PRN
  Administered 2015-09-27: 15 mg via INTRAVENOUS
  Filled 2015-09-27: qty 1

## 2015-09-27 MED ORDER — MORPHINE SULFATE (PF) 2 MG/ML IV SOLN: 2.0000 mg | INTRAVENOUS | Status: DC | PRN

## 2015-09-27 NOTE — Progress Notes (Signed)
Physical Therapy Treatment Patient Details Name: Daniel [name] MRN: [id number] DOB: July 24, 1928 Today's Date: 09/27/2015    History of Present Illness Daniel [name] is a 82 y.o. male who presents to the emergency department for evaluation of right foot and ankle pain after a mechanical, nonsyncopal fall. He states that his leg gave out while getting into the car and fell to the ground. He states that he did not strike his head or pass out. He also complains of back pain since the fall. Radiographs revealed minimally displacced R tibia and fibula fractures. T-spine CT showed T5 vertebral fractures. Orthopedics has not been consulted. Pt reports that his R leg has been his "good leg."     PT Comments    Pt shows very good effort t/o PT session but is unable to even attempt ambulation.  He needs considerable help with bed mobility and sit/stand transfers.  Pt shows good strength with L LE and does a good job trying to keep weight off R in standing but he is completely unsafe to try and go home and would likely have a lot of problems, pt needs rehab.  Follow Up Recommendations  SNF     Equipment Recommendations       Recommendations for Other Services       Precautions / Restrictions Precautions Precautions: Fall Restrictions Weight Bearing Restrictions: Yes RLE Weight Bearing: Non weight bearing    Mobility  Bed Mobility Overal bed mobility: Needs Assistance Bed Mobility: Supine to Sit;Sit to Supine     Supine to sit: Mod assist Sit to supine: Mod assist   General bed mobility comments: Pt shows good effort trying to use bed rails to get up to EOB but is simply unable to lift himself.  Pt needs considerable assist to lift trunk and less to to get legs off EOB.  Transfers Overall transfer level: Needs assistance Equipment used: Rolling walker (2 wheeled) Transfers: Sit to/from Stand Sit to Stand: Max assist         General transfer comment: Pt has a very difficult time  getting to standing even with max assist.  He shows good effort in using L LE and b/l UEs to rise but is not able to lift more than a few inches w/o heavy assist.  Ambulation/Gait             General Gait Details: Unable.  Pt makes multiple attempts at scooting/hopping on L foot but is unable to unweight (even with heavy assist from PT) enough to move foot at all.  Pt does appear able to keep weight of the R foot t/o the effort but overall is completely unable to show any mobility in standing.    Stairs            Wheelchair Mobility    Modified Rankin (Stroke Patients Only)       Balance                                    Cognition Arousal/Alertness: Awake/alert Behavior During Therapy: WFL for tasks assessed/performed Overall Cognitive Status: Within Functional Limits for tasks assessed                      Exercises General Exercises - Lower Extremity Ankle Circles/Pumps: 10 reps;AROM;Both Long Arc Quad: AROM;10 reps;Both Hip ABduction/ADduction: Strengthening;10 reps Straight Leg Raises: AROM;Both;AAROM;10 reps (AAROM only on R) Hip Flexion/Marching: 10 reps;AROM;Both  General Comments        Pertinent Vitals/Pain Pain Score: 6  Pain Location: Pt c/o more of back pain than ankle pain    Home Living                      Prior Function            PT Goals (current goals can now be found in the care plan section) Progress towards PT goals: Not progressing toward goals - comment (shows good effort but is unable to do any standing mobility)    Frequency  7X/week    PT Plan Current plan remains appropriate    Co-evaluation             End of Session Equipment Utilized During Treatment: Gait belt Activity Tolerance: Patient tolerated treatment well Patient left: with bed alarm set;with call bell/phone within reach     Time: 1515-1540 PT Time Calculation (min) (ACUTE ONLY): 25 min  Charges:  $Gait  Training: 8-22 mins $Therapeutic Exercise: 8-22 mins                    G Codes:     Wayne Both, PT, DPT [phone]  Kreg Shropshire 09/27/2015, 5:58 PM

## 2015-09-27 NOTE — Consult Note (Signed)
Patient is a 82 year old community ambulator with the aid of a walker or cane. He suffered a 2 days ago and suffered a bimalleolar ankle fracture.  On exam he is in a posterior splint is able flex extend his toes and he has moderate edema to the lower extremity with some swelling noted to the knee as well   X-ray review shows bimalleolar fracture minimally displaced  Impression bimalleolar fracture minimally displaced elderly male with limited mobility  Plan we'll plan on a cast tomorrow to allow for some swelling didn't diminish prior to casting hope for nonoperative treatment if he does not displace. Plan on follow-up in 2 weeks for cast change and x-ray and then additional 4 weeks of nonweightbearing in a cast. Expect if he stay nonweightbearing or toe-touch for 6 weeks this will adequately heal without operative intervention

## 2015-09-27 NOTE — Progress Notes (Signed)
[facility] at [place] NAME: Daniel [name]    MR#:  [id number]  DATE OF BIRTH:  30-Aug-1927  SUBJECTIVE:  CHIEF COMPLAINT:  Patient is resting comfortably but reporting pain is not well controlled and reporting low back pain and right lower extremity pain.. Tolerating BF . Wife at bedside  REVIEW OF SYSTEMS:  CONSTITUTIONAL: No fever, fatigue or weakness.  EYES: No blurred or double vision.  EARS, NOSE, AND THROAT: No tinnitus or ear pain.  RESPIRATORY: No cough, shortness of breath, wheezing or hemoptysis.  CARDIOVASCULAR: No chest pain, orthopnea, edema.  GASTROINTESTINAL: No nausea, vomiting, diarrhea or abdominal pain.  GENITOURINARY: No dysuria, hematuria.  ENDOCRINE: No polyuria, nocturia,  HEMATOLOGY: No anemia, easy bruising or bleeding SKIN: No rash or lesion. MUSCULOSKELETAL: Reporting right lower extending to pain, no arthritis.   NEUROLOGIC: No tingling, numbness, weakness.  PSYCHIATRY: No anxiety or depression.   DRUG ALLERGIES:   Allergies  Allergen Reactions  . Oxycodone Nausea And Vomiting    VITALS:  Blood pressure 150/76, pulse 90, temperature 99.1 F (37.3 C), temperature source Oral, resp. rate 16, height 5\' 9"  (1.753 m), weight 116.574 kg (257 lb), SpO2 92 %.  PHYSICAL EXAMINATION:  GENERAL:  82 y.o.-year-old patient lying in the bed with no acute distress.  EYES: Pupils equal, round, reactive to light and accommodation. No scleral icterus. Extraocular muscles intact.  HEENT: Head atraumatic, normocephalic. Oropharynx and nasopharynx clear.  NECK:  Supple, no jugular venous distention. No thyroid enlargement, no tenderness.  LUNGS: Normal breath sounds bilaterally, no wheezing, rales,rhonchi or crepitation. No use of accessory muscles of respiration.  CARDIOVASCULAR: S1, S2 normal. No murmurs, rubs, or gallops.  ABDOMEN: Soft, nontender, nondistended. Bowel sounds present. No organomegaly or mass.   EXTREMITIES: Right lower extremity in a cast. No pedal edema, cyanosis, or clubbing.  NEUROLOGIC: Cranial nerves II through XII are intact. Muscle strength 5/5 in all extremities. Sensation intact. Gait not checked.  PSYCHIATRIC: The patient is alert and oriented x 3.  SKIN: No obvious rash, lesion, or ulcer.    LABORATORY PANEL:   CBC  Recent Labs Lab 09/26/15 2043  WBC 8.8  HGB 12.1*  HCT 36.5*  PLT 170   ------------------------------------------------------------------------------------------------------------------  Chemistries   Recent Labs Lab 09/26/15 2043  NA 138  K 3.9  CL 104  CO2 27  GLUCOSE 243*  BUN 16  CREATININE 1.17  CALCIUM 9.2  AST 21  ALT 15*  ALKPHOS 78  BILITOT 1.0   ------------------------------------------------------------------------------------------------------------------  Cardiac Enzymes No results for input(s): TROPONINI in the last 168 hours. ------------------------------------------------------------------------------------------------------------------  RADIOLOGY:  Dg Thoracic Spine 2 View  09/25/2015  CLINICAL DATA:  Upper thoracic back pain after fall from standing trying to get into car today. EXAM: THORACIC SPINE 2 VIEWS COMPARISON:  None. FINDINGS: Questionable linear lucency through tentatively identified T5 vertebral body. This may be artifactual and does not have a typical fracture appearance, there is no significant loss of vertebral body height. The remainder of the thoracic spine is intact without acute fracture. Multilevel flowing anterior osteophytes consistent with diffuse idiopathic skeletal hyperostosis (DISH). IMPRESSION: Questionable nondisplaced fracture involving (tentative) T5 vertebral body versus artifact. CT recommended. Otherwise no evidence of fracture. Electronically Signed   By: Jeb Levering M.D.   On: 09/25/2015 20:48   Dg Ankle Complete Right  09/25/2015  CLINICAL DATA:  Right ankle pain after  falling from car. EXAM: RIGHT ANKLE - COMPLETE 3+ VIEW COMPARISON:  None. FINDINGS:  The bones are demineralized. There are acute mildly displaced fractures of the distal tibia and fibula. There is an oblique fracture of the base of the medial malleolus. The posterior tibial plafond appears grossly intact. There is an oblique fracture of the distal fibula extending superiorly from the ankle joint. There is no widening of the ankle mortise. The talus is located. No tarsal bone fractures are seen. There is moderate soft tissue swelling throughout the lower leg with a probable ankle joint effusion. IMPRESSION: Mildly displaced fractures of the distal tibia and fibula as described. No widening of the ankle mortise or dislocation. Electronically Signed   By: Richardean Sale M.D.   On: 09/25/2015 19:20   Ct Thoracic Spine Wo Contrast  09/25/2015  CLINICAL DATA:  Patient states fell when getting into the car this evening and landed on his back. C/o upper back and rt ankle pain. Denies loc. Subtle evidence of a fracture on the current thoracic radiographs. EXAM: CT THORACIC SPINE WITHOUT CONTRAST TECHNIQUE: Multidetector CT imaging of the thoracic spine was performed without intravenous contrast administration. Multiplanar CT image reconstructions were also generated. COMPARISON:  Thoracic radiographs obtained this evening. FINDINGS: As noted on the current radiographs, there is a fracture of the T5 vertebra. There is an oblique nondisplaced fracture the extends across the vertebral body from the right posterior lower endplate the anterior upper endplate cross to left anterior corner of the vertebra. There is no involvement of the spinal canal or posterior elements. There is mild associated soft tissue edema. No other fractures. There is no spondylolisthesis. Degenerative spurring is noted most evident along the mid and lower thoracic spine where there are bridging anterior osteophytes. There is loss of disc height  throughout the thoracic spine. The bones are diffusely demineralized. Visualize lungs, and there some ground-glass opacity most evident the right lower lobe, linear and reticular opacities mild lower lobe bronchiectasis. No pleural effusion. There changes from a previous left thyroid lobectomy. IMPRESSION: 1. Nondisplaced, non comminuted fracture of the T5 vertebra. This does not involve the spinal canal or posterior elements. Fracture does extend from posterior aspect of the vertebral body to its anterior aspect. 2. No other fractures.  No spondylolisthesis. Electronically Signed   By: Lajean Manes M.D.   On: 09/25/2015 21:44    EKG:   Orders placed or performed in visit on 11/20/12  . EKG 12-Lead    ASSESSMENT AND PLAN:    1. Closed distal tibia and fibula fracture.  Patient also has a fracture of T5. Patient is admitted under observation status PT is recommending skilled nursing facility Dr. Rudene Christians, ortho is  recommending right lower extremity cast and nonweightbearing for 6 weeks  Add morphine to the regimen for better pain control When necessary tramadol.  Physical therapy evaluation with nonweightbearing on the right leg.  2. Type 2 diabetes on Lantus and Januvia. We'll put on sliding scale and draw labs   3. Essential hypertension- continue his usual medications  4. Chronic kidney disease stage III on prior labs. Check a BMP  5. Hyperlipidemia unspecified on atorvastatin     All the records are reviewed and case discussed with Care Management/Social Workerr. Management plans discussed with the patient, family and they are in agreement.  CODE STATUS: fc  TOTAL TIME TAKING CARE OF THIS PATIENT: 35  minutes.   POSSIBLE D/C IN 1-2 DAYS, DEPENDING ON CLINICAL CONDITION.   Nicholes Mango M.D on 09/27/2015 at 3:52 PM  Between 7am to 6pm - Pager -  [phone] After 6pm go to [email or web address] - password [email or web address] [facility]  Office   [phone]  CC: Primary care physician; Maximino Greenland, MD

## 2015-09-28 DIAGNOSIS — S82301A Unspecified fracture of lower end of right tibia, initial encounter for closed fracture: Secondary | ICD-10-CM | POA: Diagnosis not present

## 2015-09-28 DIAGNOSIS — S82201A Unspecified fracture of shaft of right tibia, initial encounter for closed fracture: Secondary | ICD-10-CM | POA: Diagnosis not present

## 2015-09-28 DIAGNOSIS — S22058A Other fracture of T5-T6 vertebra, initial encounter for closed fracture: Secondary | ICD-10-CM | POA: Diagnosis not present

## 2015-09-28 DIAGNOSIS — E162 Hypoglycemia, unspecified: Secondary | ICD-10-CM | POA: Diagnosis not present

## 2015-09-28 DIAGNOSIS — E119 Type 2 diabetes mellitus without complications: Secondary | ICD-10-CM | POA: Diagnosis not present

## 2015-09-28 DIAGNOSIS — I10 Essential (primary) hypertension: Secondary | ICD-10-CM | POA: Diagnosis not present

## 2015-09-28 LAB — GLUCOSE, CAPILLARY
GLUCOSE-CAPILLARY: 68 mg/dL (ref 65–99)
GLUCOSE-CAPILLARY: 76 mg/dL (ref 65–99)
GLUCOSE-CAPILLARY: 198 mg/dL — AB (ref 65–99)
Glucose-Capillary: 94 mg/dL (ref 65–99)
Glucose-Capillary: 182 mg/dL — ABNORMAL HIGH (ref 65–99)

## 2015-09-28 MED ORDER — MAGNESIUM HYDROXIDE 400 MG/5ML PO SUSP
30.0000 mL | Freq: Two times a day (BID) | ORAL | Status: AC
  Administered 2015-09-28 (×2): 30 mL via ORAL
  Filled 2015-09-28 (×2): qty 30

## 2015-09-28 MED ORDER — IBUPROFEN 400 MG PO TABS
400.0000 mg | ORAL_TABLET | Freq: Four times a day (QID) | ORAL | Status: DC | PRN
  Administered 2015-09-28: 400 mg via ORAL
  Filled 2015-09-28: qty 1

## 2015-09-28 MED ORDER — INSULIN GLARGINE 100 UNIT/ML ~~LOC~~ SOLN
40.0000 [IU] | Freq: Every day | SUBCUTANEOUS | Status: DC
  Administered 2015-09-29: 40 [IU] via SUBCUTANEOUS
  Filled 2015-09-28: qty 0.4

## 2015-09-28 MED ORDER — LACTULOSE 10 GM/15ML PO SOLN
30.0000 g | Freq: Two times a day (BID) | ORAL | Status: DC
  Administered 2015-09-28 – 2015-09-29 (×3): 30 g via ORAL
  Filled 2015-09-28 (×4): qty 60

## 2015-09-28 NOTE — Progress Notes (Signed)
[facility] at [place] NAME: Daniel [name]    MR#:  [id number]  DATE OF BIRTH:  [birth year]  SUBJECTIVE:  CHIEF COMPLAINT:  Patient is resting comfortably, reporting pain is better. Hypoglycemic today but tolerating by mouth fine. Patient lives alone. Cast was placed by orthopedics today  REVIEW OF SYSTEMS:  CONSTITUTIONAL: No fever, fatigue or weakness.  EYES: No blurred or double vision.  EARS, NOSE, AND THROAT: No tinnitus or ear pain.  RESPIRATORY: No cough, shortness of breath, wheezing or hemoptysis.  CARDIOVASCULAR: No chest pain, orthopnea, edema.  GASTROINTESTINAL: No nausea, vomiting, diarrhea or abdominal pain.  GENITOURINARY: No dysuria, hematuria.  ENDOCRINE: No polyuria, nocturia,  HEMATOLOGY: No anemia, easy bruising or bleeding SKIN: No rash or lesion. MUSCULOSKELETAL: Reporting right lower extending to pain, no arthritis.   NEUROLOGIC: No tingling, numbness, weakness.  PSYCHIATRY: No anxiety or depression.   DRUG ALLERGIES:   Allergies  Allergen Reactions  . Oxycodone Nausea And Vomiting    VITALS:  Blood pressure 142/65, pulse 86, temperature 98.5 F (36.9 C), temperature source Oral, resp. rate 16, height 5\' 9"  (1.753 m), weight 116.574 kg (257 lb), SpO2 97 %.  PHYSICAL EXAMINATION:  GENERAL:  82 y.o.-year-old patient lying in the bed with no acute distress.  EYES: Pupils equal, round, reactive to light and accommodation. No scleral icterus. Extraocular muscles intact.  HEENT: Head atraumatic, normocephalic. Oropharynx and nasopharynx clear.  NECK:  Supple, no jugular venous distention. No thyroid enlargement, no tenderness.  LUNGS: Normal breath sounds bilaterally, no wheezing, rales,rhonchi or crepitation. No use of accessory muscles of respiration.  CARDIOVASCULAR: S1, S2 normal. No murmurs, rubs, or gallops.  ABDOMEN: Soft, nontender, nondistended. Bowel sounds present. No organomegaly or mass.   EXTREMITIES: Right lower extremity in a cast. No pedal edema, cyanosis, or clubbing.  NEUROLOGIC: Cranial nerves II through XII are intact. Muscle strength 5/5 in all extremities. Sensation intact. Gait not checked.  PSYCHIATRIC: The patient is alert and oriented x 3.  SKIN: No obvious rash, lesion, or ulcer.    LABORATORY PANEL:   CBC  Recent Labs Lab 09/26/15 2043  WBC 8.8  HGB 12.1*  HCT 36.5*  PLT 170   ------------------------------------------------------------------------------------------------------------------  Chemistries   Recent Labs Lab 09/26/15 2043  NA 138  K 3.9  CL 104  CO2 27  GLUCOSE 243*  BUN 16  CREATININE 1.17  CALCIUM 9.2  AST 21  ALT 15*  ALKPHOS 78  BILITOT 1.0   ------------------------------------------------------------------------------------------------------------------  Cardiac Enzymes No results for input(s): TROPONINI in the last 168 hours. ------------------------------------------------------------------------------------------------------------------  RADIOLOGY:  No results found.  EKG:   Orders placed or performed in visit on 11/20/12  . EKG 12-Lead    ASSESSMENT AND PLAN:    1. Closed distal tibia and fibula fracture.  Patient also has a fracture of T5. Patient is admitted under observation status PT is recommending skilled nursing facility Dr. Rudene Christians, ortho is  recommending right lower extremity cast , which was placed today and nonweightbearing for 4 weeks. Patient lives alone with no support, will discuss with care management and social worker for home health arrangement   morphine to the regimen for better pain control When necessary tramadol.  Physical therapy evaluation with nonweightbearing on the right leg.  2. Type 2 diabetes with hypoglycemia  on Lantus and decrease the dose to 40 units and monitor sugars closely  Continue Januvia.   sliding scale    3. Essential hypertension- continue his  usual  medications  4. Chronic kidney disease stage III on prior labs. Check a BMP  In am  5. Hyperlipidemia unspecified on atorvastatin     All the records are reviewed and case discussed with Care Management/Social Workerr. Management plans discussed with the patient, family and they are in agreement.  CODE STATUS: fc  TOTAL TIME TAKING CARE OF THIS PATIENT: 35  minutes.   POSSIBLE D/C IN am , DEPENDING ON CLINICAL CONDITION.   Nicholes Mango M.D on 09/28/2015 at 1:48 PM  Between 7am to 6pm - Pager - [phone] After 6pm go to [email or web address] - password [email or web address] [facility]  Office  [phone]  CC: Primary care physician; Maximino Greenland, MD

## 2015-09-28 NOTE — Progress Notes (Signed)
Spoke with Dr.Gouru regarding pt's blood sugar of 68, rechecked 76.  MD to make changes to lantus insulin.

## 2015-09-28 NOTE — Progress Notes (Signed)
Patient's daughter Maudie Mercury provided CSW with a copy of HPOA and POA notarized form.  CSW made a copy and placed in patient's chart. CSW has discussed discharge options with patient and family.  Patient insurance will not pay for SNF as he does not have a 3 night qualifying stay and his admission status is observation.  CSW contacted facility per request of patient.  Patient would have to private pay and the cost is $260 per day.  Facility would require $3,500 prior to patient coming.  Patient does not have the money to private pay.  He has limited support at home with his daughter and male friend who visit twice a week and makes sure his bills are paid.  CSW has spoken with RN care manager who will set up home health to include a social worker to assess patient's discharge needs base on order from MD.  Casimer Lanius. [place] Work Department [phone] 10:10 AM

## 2015-09-28 NOTE — Progress Notes (Signed)
Short leg cast applied. Skin is intact and swelling is diminished.  He'll be toe-touch weightbearing for the next 6 weeks, we will need to see him back in 2 weeks for follow-up x-ray to make sure that no displacement

## 2015-09-28 NOTE — Care Management Note (Signed)
Case Management Note  Patient Details  Name: Daniel [name] MRN: [id number] Date of Birth: 1927/08/24  Subjective/Objective:     This Probation officer discussed discharge planning with [facility]-SW and Dr Rudene Christians yesterday. [name] is an Observation patient and does not meet Medicare criteria for rehab. Current plan is to discharge to home with home health. [name] has a wheelchair, walker and a male friend who provides transportation and checks in on him occasionally.             Action/Plan:   Expected Discharge Date:                  Expected Discharge Plan:     In-House Referral:     Discharge planning Services     Post Acute Care Choice:    Choice offered to:     DME Arranged:    DME Agency:     HH Arranged:    [place] Agency:     Status of Service:     Medicare Important Message Given:    Date Medicare IM Given:    Medicare IM give by:    Date Additional Medicare IM Given:    Additional Medicare Important Message give by:     If discussed at [place] of Stay Meetings, dates discussed:    Additional Comments:  Zaim Nitta A, RN 09/28/2015, 10:36 AM

## 2015-09-28 NOTE — Progress Notes (Signed)
Patients blood sugar -68. Given apple juice to drink. Will cont to monitor the patient and blood sugar.

## 2015-09-28 NOTE — Progress Notes (Signed)
Physical Therapy Treatment Patient Details Name: Daniel [name] MRN: [id number] DOB: [birth year] Today's Date: 09/28/2015    History of Present Illness Daniel [name] is a 82 y.o. male who presents to the emergency department for evaluation of right foot and ankle pain after a mechanical, nonsyncopal fall. He states that his leg gave out while getting into the car and fell to the ground. He states that he did not strike his head or pass out. He also complains of back pain since the fall. Radiographs revealed minimally displacced R tibia and fibula fractures. T-spine CT showed T5 vertebral fractures. Orthopedics has not been consulted. Pt reports that his R leg has been his "good leg."     PT Comments    Pt is still very limited with mobility and is unable to even maintain standing with min/mod assist.  He shows good effort with exercises and mobility but ultimately is unable to really do any functional mobility in a safe way.  This pt is not safe to go home and needs a higher level of care given his current mobility and function.  Follow Up Recommendations  SNF     Equipment Recommendations       Recommendations for Other Services       Precautions / Restrictions Precautions Precautions: Fall Restrictions Weight Bearing Restrictions: Yes RLE Weight Bearing: Non weight bearing    Mobility  Bed Mobility Overal bed mobility: Needs Assistance Bed Mobility: Supine to Sit;Sit to Supine     Supine to sit: Mod assist Sit to supine: Mod assist   General bed mobility comments: Pt again shows good effort trying to get to EOB, but is weak and needs considerable assist to transition  Transfers Overall transfer level: Needs assistance Equipment used: Rolling walker (2 wheeled) Transfers: Sit to/from Stand Sit to Stand: Max assist         General transfer comment: Despite numerous attempts pt continues to be very limited with ability to stand and ultimately is unable to actually stand  and maintain balnace, he is completely unsafe.    Ambulation/Gait             General Gait Details: Unable, pt hardly able to maintain static standing today with max assist, unable to even attempt moving either foot even an inch.   Stairs            Wheelchair Mobility    Modified Rankin (Stroke Patients Only)       Balance                                    Cognition Arousal/Alertness: Awake/alert Behavior During Therapy: WFL for tasks assessed/performed Overall Cognitive Status: Within Functional Limits for tasks assessed                      Exercises General Exercises - Lower Extremity Ankle Circles/Pumps: 10 reps;AROM;[name] Quad Sets: 10 reps;Strengthening Heel Slides: AROM;Strengthening;10 reps (no resisted WBing on R) Hip ABduction/ADduction: Strengthening;10 reps Straight Leg Raises: AROM;[name];AAROM;10 reps    General Comments        Pertinent Vitals/Pain Pain Score: 7  Pain Location: c/o more back pain than ankle pain    Home Living                      Prior Function            PT Goals (current  goals can now be found in the care plan section) Progress towards PT goals: PT to reassess next treatment (pt still very limited and unsafe at this time)    Frequency  7X/week    PT Plan Current plan remains appropriate    Co-evaluation             End of Session Equipment Utilized During Treatment: Gait belt Activity Tolerance: Patient limited by pain;Patient limited by fatigue Patient left: with bed alarm set;with call bell/phone within reach     Time: 1018-1042 PT Time Calculation (min) (ACUTE ONLY): 24 min  Charges:  $Gait Training: 8-22 mins $Therapeutic Exercise: 8-22 mins                    G Codes:     Daniel [name], PT, DPT [phone]  Daniel [name] 09/28/2015, 2:01 PM
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
## 2015-09-29 DIAGNOSIS — S82301A Unspecified fracture of lower end of right tibia, initial encounter for closed fracture: Secondary | ICD-10-CM | POA: Diagnosis not present

## 2015-09-29 DIAGNOSIS — E119 Type 2 diabetes mellitus without complications: Secondary | ICD-10-CM | POA: Diagnosis not present

## 2015-09-29 DIAGNOSIS — I10 Essential (primary) hypertension: Secondary | ICD-10-CM | POA: Diagnosis not present

## 2015-09-29 DIAGNOSIS — S82201A Unspecified fracture of shaft of right tibia, initial encounter for closed fracture: Secondary | ICD-10-CM | POA: Diagnosis not present

## 2015-09-29 DIAGNOSIS — M6281 Muscle weakness (generalized): Secondary | ICD-10-CM | POA: Diagnosis not present

## 2015-09-29 DIAGNOSIS — S22058A Other fracture of T5-T6 vertebra, initial encounter for closed fracture: Secondary | ICD-10-CM | POA: Diagnosis not present

## 2015-09-29 DIAGNOSIS — E162 Hypoglycemia, unspecified: Secondary | ICD-10-CM | POA: Diagnosis not present

## 2015-09-29 LAB — GLUCOSE, CAPILLARY
Glucose-Capillary: 140 mg/dL — ABNORMAL HIGH (ref 65–99)
Glucose-Capillary: 189 mg/dL — ABNORMAL HIGH (ref 65–99)

## 2015-09-29 LAB — BASIC METABOLIC PANEL
ANION GAP: 10 (ref 5–15)
BUN: 17 mg/dL (ref 6–20)
CHLORIDE: 102 mmol/L (ref 101–111)
CO2: 26 mmol/L (ref 22–32)
Calcium: 8.9 mg/dL (ref 8.9–10.3)
Creatinine, Ser: 1.28 mg/dL — ABNORMAL HIGH (ref 0.61–1.24)
GFR calc Af Amer: 57 mL/min — ABNORMAL LOW (ref >60)
GFR, EST NON AFRICAN AMERICAN: 49 mL/min — AB (ref >60)
GLUCOSE: 143 mg/dL — AB (ref 65–99)
POTASSIUM: 3.9 mmol/L (ref 3.5–5.1)
Sodium: 138 mmol/L (ref 135–145)

## 2015-09-29 LAB — CBC
HEMATOCRIT: 33.5 % — AB (ref 40.0–52.0)
HEMOGLOBIN: 11.3 g/dL — AB (ref 13.0–18.0)
MCH: 25.9 pg — ABNORMAL LOW (ref 26.0–34.0)
MCHC: 33.8 g/dL (ref 32.0–36.0)
MCV: 76.6 fL — AB (ref 80.0–100.0)
Platelets: 182 10*3/uL (ref 150–440)
RBC: 4.37 MIL/uL — AB (ref 4.40–5.90)
RDW: 14.8 % — ABNORMAL HIGH (ref 11.5–14.5)
WBC: 8.3 10*3/uL (ref 3.8–10.6)

## 2015-09-29 MED ORDER — IBUPROFEN 400 MG PO TABS: 400.0000 mg | ORAL_TABLET | Freq: Four times a day (QID) | ORAL | Status: DC | PRN

## 2015-09-29 MED ORDER — INSULIN GLARGINE 300 UNIT/ML ~~LOC~~ SOPN: 45.0000 [IU] | PEN_INJECTOR | Freq: Every day | SUBCUTANEOUS | Status: DC

## 2015-09-29 MED ORDER — TRAMADOL HCL 50 MG PO TABS: 50.0000 mg | ORAL_TABLET | Freq: Four times a day (QID) | ORAL | Status: DC

## 2015-09-29 NOTE — Discharge Instructions (Signed)
Activity - non wt bearing on RLE DIET - diabetic / low salt  Follow-up with orthopedics Dr. Rudene Christians in 2 weeks  Follow-up with primary care physician in a week  Follow-up with outpatient diabetic clinic lifestyle in 3 days

## 2015-09-29 NOTE — Care Management (Signed)
Met with patient who now has a discharge to home order. He states he "cannot walk since Thursday when he fell at the seafood restaurant". He states his friend Rosetta had taken him out to eat that day. He states he has 10 children but cannot depend on them for help. He says his daughter Maudie Mercury recently fell and fractured her knee "but is walking" and apparently going to outpatient rehab. His PCP is in [place] Dr. Baird Cancer. He states he had Medicaid once before when he spent 8 months at [facility]. List of home health agencies provided. Message left at [facility] telephone number listed. Kim's number is not listed- advised patient to contact her. EMS packet created however someone will need to be available in home to receive patient.

## 2015-09-29 NOTE — Clinical Social Work Placement (Signed)
   CLINICAL SOCIAL WORK PLACEMENT  NOTE  Date:  09/29/2015  Patient Details  Name: Daniel [name] MRN: [id number] Date of Birth: 1928/04/23  Clinical Social Work is seeking post-discharge placement for this patient at the [place] level of care (*CSW will initial, date and re-position this form in  chart as items are completed):  Yes   Patient/family provided with [facility] Work Department's list of facilities offering this level of care within the geographic area requested by the patient (or if unable, by the patient's family).  Yes   Patient/family informed of their freedom to choose among providers that offer the needed level of care, that participate in Medicare, Medicaid or managed care program needed by the patient, have an available bed and are willing to accept the patient.  Yes   Patient/family informed of [facility]'s ownership interest in [facility] and [facility], as well as of the fact that they are under no obligation to receive care at these facilities.  PASRR submitted to EDS on       PASRR number received on       Existing PASRR number confirmed on 09/29/15     FL2 transmitted to all facilities in geographic area requested by pt/family on 09/29/15     FL2 transmitted to all facilities within larger geographic area on       Patient informed that his/her managed care company has contracts with or will negotiate with certain facilities, including the following:        Yes   Patient/family informed of bed offers received.  Patient chooses bed at  [facility] )     Physician recommends and patient chooses bed at      Patient to be transferred to  [facility] ) on 09/29/15.  Patient to be transferred to facility by  [facility] )     Patient family notified on 09/29/15 of transfer.  Name of family member notified:   (Patient's daughter Daniel [name] is aware of D/C today. )     PHYSICIAN        Additional Comment:    _______________________________________________ Loralyn Freshwater, [name] 09/29/2015, 2:53 PM

## 2015-09-29 NOTE — Consult Note (Signed)
   [facility] Inpatient Consult   09/29/2015  Karnell Aronhalt 09/08/1927 [id number]  This RNCM received a call from patient's [place] inquiring about patient being in the hospital. Chart review reveals patient is being discharged to [facility] today. Alerted [facility] team about patient's disposition. Patient is active with [facility] care management prior to admission. For questions please contact:   Kynsley Whitehouse RN, [facility] Liaison  [phone]) Business Mobile [phone]) Toll free office

## 2015-09-29 NOTE — Care Management (Signed)
Plan has changed. CSW working on SNF placement under LOG pending payer.

## 2015-09-29 NOTE — Progress Notes (Signed)
Per [facility] admissions coordinator at [facility] patient was a resident at [facility] from 11/16/13 to 01/31/15. Per Helene Kelp patient signed out of [facility]. Per Helene Kelp patient did have [place] Medicaid however as soon as he left [employer] was notified and it was stopped. Per Helene Kelp patient will have to reapply for [place] Medicaid through DSS. CSW also left Vance Gather Medicaid worker at [facility] a Advertising account executive.   Patient is Medicare observation and cannot pay privately for SNF. CSW contacted [place] clinical social work Surveyor, quantity and inquired about an [place]. Zack declined LOG and reported that patient will have to pay privately for SNF or go home with family. RN Case Manager aware of above. CSW will continue to follow and assist as needed.   Blima Rich, [name] [phone]

## 2015-09-29 NOTE — Discharge Summary (Signed)
[place] [facility] [place] NAME: Daniel [name]    MR#:  [id number]  DATE OF BIRTH:  20-Jan-1928  DATE OF ADMISSION:  09/25/2015 ADMITTING PHYSICIAN: Loletha Grayer, MD  DATE OF DISCHARGE: 09/29/15 PRIMARY CARE PHYSICIAN: Maximino Greenland, MD    ADMISSION DIAGNOSIS:  Pain [R52] Fibula fracture, right, closed, initial encounter [S82.401A] Tibia fracture, right, closed, initial encounter [S82.201A] Closed fracture of fifth thoracic vertebra, unspecified fracture morphology, initial encounter ([place]) [S22.059A]  DISCHARGE DIAGNOSIS:  Active Problems:   Tibia/fibula fracture   SECONDARY DIAGNOSIS:   Past Medical History  Diagnosis Date  . Arthritis   . Diabetes mellitus without complication ([place])   . Dementia   . Renal disorder   . Hypertension     HOSPITAL COURSE:   1. Closed distal tibia and fibula fracture.  Patient also has a fracture of T5. PT is recommending skilled nursing facility, will transfer patient to skilled nursing facility when bed is available Dr. Rudene Christians, ortho is recommending right lower extremity cast , which was placed 2/26 and nonweightbearing for 4 weeks. Patient lives alone with no support Pain management with tramadol and  ibuprofen as patient is allergic to oxycodone Physical therapy evaluation with nonweightbearing on the right leg.  2. Type 2 diabetes with hypoglycemia  on Lantus and decrease the dose to 45 units for hypoglycemia Continue Januvia.     3. Essential hypertension- continue his usual medications  4. Chronic kidney disease stage III on prior labs. Creatinine at his baseline, outpatient follow-up with nephrology is recommended  5. Hyperlipidemia unspecified on atorvastatin   DISCHARGE CONDITIONS:   fair  CONSULTS OBTAINED:  Treatment Team:  Hessie Knows, MD   PROCEDURES  Cast placement LLE  DRUG ALLERGIES:   Allergies  Allergen Reactions  . Oxycodone Nausea And Vomiting     DISCHARGE MEDICATIONS:   Current Discharge Medication List    START taking these medications   Details  ibuprofen (ADVIL,MOTRIN) 400 MG tablet Take 1 tablet (400 mg total) by mouth every 6 (six) hours as needed for headache or moderate pain. Qty: 30 tablet, Refills: 0    traMADol (ULTRAM) 50 MG tablet Take 1 tablet (50 mg total) by mouth every 6 (six) hours. Qty: 120 tablet, Refills: 0      CONTINUE these medications which have CHANGED   Details  Insulin Glargine (TOUJEO SOLOSTAR) 300 UNIT/ML SOPN Inject 45 Units into the skin daily. Qty: 5 pen, Refills: 0      CONTINUE these medications which have NOT CHANGED   Details  amLODipine (NORVASC) 10 MG tablet Take 10 mg by mouth daily.    atorvastatin (LIPITOR) 10 MG tablet Take 5 mg by mouth daily.    bumetanide (BUMEX) 0.5 MG tablet Take 0.5 mg by mouth daily.    Hypromellose (NATURAL BALANCE TEARS) 0.4 % SOLN Apply 1 drop to eye every 6 (six) hours as needed (for dry eyes).    losartan (COZAAR) 25 MG tablet Take 25 mg by mouth daily.    memantine (NAMENDA XR) 28 MG CP24 24 hr capsule Take 28 mg by mouth daily.    sitaGLIPtin (JANUVIA) 50 MG tablet Take 50 mg by mouth daily. Reported on 07/30/2015         DISCHARGE INSTRUCTIONS:   Activity - non wt bearing on RLE DIET - diabetic / low salt  Follow-up with orthopedics Dr. Rudene Christians in 2 weeks  Follow-up with primary care physician in a week  Follow-up with outpatient  diabetic clinic lifestyle in 3 days    DIET:  DIET - diabetic / low salt   DISCHARGE CONDITION:  Fair  ACTIVITY:  Nonweightbearing on right lower extremity  OXYGEN:  Home Oxygen: No.   Oxygen Delivery: room air  DISCHARGE LOCATION:  nursing home   If you experience worsening of your admission symptoms, develop shortness of breath, life threatening emergency, suicidal or homicidal thoughts you must seek medical attention immediately by calling 911 or calling your MD immediately  if symptoms  less severe.  You Must read complete instructions/literature along with all the possible adverse reactions/side effects for all the Medicines you take and that have been prescribed to you. Take any new Medicines after you have completely understood and accpet all the possible adverse reactions/side effects.   Please note  You were cared for by a hospitalist during your hospital stay. If you have any questions about your discharge medications or the care you received while you were in the hospital after you are discharged, you can call the unit and asked to speak with the hospitalist on call if the hospitalist that took care of you is not available. Once you are discharged, your primary care physician will handle any further medical issues. Please note that NO REFILLS for any discharge medications will be authorized once you are discharged, as it is imperative that you return to your primary care physician (or establish a relationship with a primary care physician if you do not have one) for your aftercare needs so that they can reassess your need for medications and monitor your lab values.     Today  Chief Complaint  Patient presents with  . Fall   Patient is resting comfortably. Pain is well controlled with the current pain management. Lives alone and prefers going to a rehabilitation center  ROS:  CONSTITUTIONAL: Denies fevers, chills. Denies any fatigue, weakness.  EYES: Denies blurry vision, double vision, eye pain. EARS, NOSE, THROAT: Denies tinnitus, ear pain, hearing loss. RESPIRATORY: Denies cough, wheeze, shortness of breath.  CARDIOVASCULAR: Denies chest pain, palpitations, edema.  GASTROINTESTINAL: Denies nausea, vomiting, diarrhea, abdominal pain. Denies bright red blood per rectum. GENITOURINARY: Denies dysuria, hematuria. ENDOCRINE: Denies nocturia or thyroid problems. HEMATOLOGIC AND LYMPHATIC: Denies easy bruising or bleeding. SKIN: Denies rash or  lesion. MUSCULOSKELETAL: Right lower extremity in a cast.   Denies pain in neck, back, shoulder, knees, hips or arthritic symptoms.  NEUROLOGIC: Denies paralysis, paresthesias.  PSYCHIATRIC: Denies anxiety or depressive symptoms.   VITAL SIGNS:  Blood pressure 136/61, pulse 89, temperature 98.1 F (36.7 C), temperature source Oral, resp. rate 20, height 5\' 9"  (1.753 m), weight 116.574 kg (257 lb), SpO2 97 %.  I/O:   Intake/Output Summary (Last 24 hours) at 09/29/15 1313 Last data filed at 09/29/15 0408  Gross per 24 hour  Intake    240 ml  Output    950 ml  Net   -710 ml    PHYSICAL EXAMINATION:  GENERAL:  82 y.o.-year-old patient lying in the bed with no acute distress.  EYES: Pupils equal, round, reactive to light and accommodation. No scleral icterus. Extraocular muscles intact.  HEENT: Head atraumatic, normocephalic. Oropharynx and nasopharynx clear.  NECK:  Supple, no jugular venous distention. No thyroid enlargement, no tenderness.  LUNGS: Normal breath sounds bilaterally, no wheezing, rales,rhonchi or crepitation. No use of accessory muscles of respiration.  CARDIOVASCULAR: S1, S2 normal. No murmurs, rubs, or gallops.  ABDOMEN: Soft, non-tender, non-distended. Bowel sounds present. No organomegaly or mass.  EXTREMITIES: RLE - cast and peripheral pulses are 2+, No pedal edema, cyanosis, or clubbing.  NEUROLOGIC: Cranial nerves II through XII are intact. Muscle strength 5/5 in all extremities. Sensation intact. Gait not checked.  PSYCHIATRIC: The patient is alert and oriented x 3.  SKIN: No obvious rash, lesion, or ulcer.   DATA REVIEW:   CBC  Recent Labs Lab 09/29/15 0359  WBC 8.3  HGB 11.3*  HCT 33.5*  PLT 182    Chemistries   Recent Labs Lab 09/26/15 2043 09/29/15 0359  NA 138 138  K 3.9 3.9  CL 104 102  CO2 27 26  GLUCOSE 243* 143*  BUN 16 17  CREATININE 1.17 1.28*  CALCIUM 9.2 8.9  AST 21  --   ALT 15*  --   ALKPHOS 78  --   BILITOT 1.0  --      Cardiac Enzymes No results for input(s): TROPONINI in the last 168 hours.  Microbiology Results  No results found for this or any previous visit.  RADIOLOGY:  Dg Thoracic Spine 2 View  09/25/2015  CLINICAL DATA:  Upper thoracic back pain after fall from standing trying to get into car today. EXAM: THORACIC SPINE 2 VIEWS COMPARISON:  None. FINDINGS: Questionable linear lucency through tentatively identified T5 vertebral body. This may be artifactual and does not have a typical fracture appearance, there is no significant loss of vertebral body height. The remainder of the thoracic spine is intact without acute fracture. Multilevel flowing anterior osteophytes consistent with diffuse idiopathic skeletal hyperostosis (DISH). IMPRESSION: Questionable nondisplaced fracture involving (tentative) T5 vertebral body versus artifact. CT recommended. Otherwise no evidence of fracture. Electronically Signed   By: Jeb Levering M.D.   On: 09/25/2015 20:48   Dg Ankle Complete Right  09/25/2015  CLINICAL DATA:  Right ankle pain after falling from car. EXAM: RIGHT ANKLE - COMPLETE 3+ VIEW COMPARISON:  None. FINDINGS: The bones are demineralized. There are acute mildly displaced fractures of the distal tibia and fibula. There is an oblique fracture of the base of the medial malleolus. The posterior tibial plafond appears grossly intact. There is an oblique fracture of the distal fibula extending superiorly from the ankle joint. There is no widening of the ankle mortise. The talus is located. No tarsal bone fractures are seen. There is moderate soft tissue swelling throughout the lower leg with a probable ankle joint effusion. IMPRESSION: Mildly displaced fractures of the distal tibia and fibula as described. No widening of the ankle mortise or dislocation. Electronically Signed   By: Richardean Sale M.D.   On: 09/25/2015 19:20   Ct Thoracic Spine Wo Contrast  09/25/2015  CLINICAL DATA:  Patient states fell  when getting into the car this evening and landed on his back. C/o upper back and rt ankle pain. Denies loc. Subtle evidence of a fracture on the current thoracic radiographs. EXAM: CT THORACIC SPINE WITHOUT CONTRAST TECHNIQUE: Multidetector CT imaging of the thoracic spine was performed without intravenous contrast administration. Multiplanar CT image reconstructions were also generated. COMPARISON:  Thoracic radiographs obtained this evening. FINDINGS: As noted on the current radiographs, there is a fracture of the T5 vertebra. There is an oblique nondisplaced fracture the extends across the vertebral body from the right posterior lower endplate the anterior upper endplate cross to left anterior corner of the vertebra. There is no involvement of the spinal canal or posterior elements. There is mild associated soft tissue edema. No other fractures. There is no spondylolisthesis. Degenerative spurring is  noted most evident along the mid and lower thoracic spine where there are bridging anterior osteophytes. There is loss of disc height throughout the thoracic spine. The bones are diffusely demineralized. Visualize lungs, and there some ground-glass opacity most evident the right lower lobe, linear and reticular opacities mild lower lobe bronchiectasis. No pleural effusion. There changes from a previous left thyroid lobectomy. IMPRESSION: 1. Nondisplaced, non comminuted fracture of the T5 vertebra. This does not involve the spinal canal or posterior elements. Fracture does extend from posterior aspect of the vertebral body to its anterior aspect. 2. No other fractures.  No spondylolisthesis. Electronically Signed   By: Lajean Manes M.D.   On: 09/25/2015 21:44    EKG:   Orders placed or performed in visit on 11/20/12  . EKG 12-Lead      Management plans discussed with the patient, family and they are in agreement.  CODE STATUS:     Code Status Orders        Start     Ordered   09/26/15 1614  Full  code   Continuous     09/26/15 1614    Code Status History    Date Active Date Inactive Code Status Order ID Comments User Context   This patient has a current code status but no historical code status.      TOTAL TIME TAKING CARE OF THIS PATIENT: 45  minutes.    @[facility] @  on 09/29/2015 at 1:13 PM  Between 7am to 6pm - Pager - [phone]  After 6pm go to [email or web address] - password [email or web address] [place] [facility]  Office  [phone]  CC: Primary care physician; Maximino Greenland, MD

## 2015-09-29 NOTE — [place] FL2 (Signed)
[place] LEVEL OF CARE SCREENING TOOL     IDENTIFICATION  Patient Name: Daniel [name] Birthdate: [birth year] Sex: male Admission Date (Current Location): 09/25/2015  [place] and [place] Number:  Engineering geologist and Address:  [facility], [address], [place], [place] [address]      Provider Number: [id number]  Attending Physician Name and Address:  Nicholes Mango, MD  Relative Name and Phone Number:       Current Level of Care: Hospital Recommended Level of Care: [place] Prior Approval Number:    Date Approved/Denied:   PASRR Number:  ( [id number] )  Discharge Plan: SNF    Current Diagnoses: Patient Active Problem List   Diagnosis Date Noted  . Tibia/fibula fracture 09/26/2015    Orientation RESPIRATION BLADDER Height & Weight     Self, Time, Place, Situation  Normal Continent Weight: 257 lb (116.574 kg) Height:  5\' 9"  (175.3 cm)  BEHAVIORAL SYMPTOMS/MOOD NEUROLOGICAL BOWEL NUTRITION STATUS   (none )  (none ) Continent Diet (Diet: Soft )  AMBULATORY STATUS COMMUNICATION OF NEEDS Skin   Extensive Assist Verbally Normal                       Personal Care Assistance Level of Assistance  Bathing, Feeding, Dressing Bathing Assistance: Limited assistance Feeding assistance: Independent Dressing Assistance: Limited assistance     Functional Limitations Info  Sight, Hearing, Speech Sight Info: Adequate Hearing Info: Adequate Speech Info: Adequate    SPECIAL CARE FACTORS FREQUENCY  PT (By licensed PT)     PT Frequency:  (2-3)              Contractures      Additional Factors Info  Code Status, Allergies, Insulin Sliding Scale Code Status Info:  (Full Code. ) Allergies Info:  (Oxycodone)   Insulin Sliding Scale Info:  (NovoLog Insulin Injections 3 tiems daily )       Current Medications (09/29/2015):  This is the current hospital active medication list Current  Facility-Administered Medications  Medication Dose Route Frequency Provider Last Rate Last Dose  . amLODipine (NORVASC) tablet 10 mg  10 mg Oral Daily Paulette Blanch, MD   10 mg at 09/29/15 [id number]  . atorvastatin (LIPITOR) tablet 5 mg  5 mg Oral Daily Paulette Blanch, MD   5 mg at 09/29/15 [id number]  . bumetanide (BUMEX) tablet 0.5 mg  0.5 mg Oral Daily Paulette Blanch, MD   0.5 mg at 09/29/15 [id number]  . enoxaparin (LOVENOX) injection 40 mg  40 mg Subcutaneous Q24H Loletha Grayer, MD   40 mg at 09/28/15 1551  . Hypromellose 0.4 % SOLN 1 drop  1 drop Ophthalmic Q6H PRN Paulette Blanch, MD      . ibuprofen (ADVIL,MOTRIN) tablet 400 mg  400 mg Oral Q6H PRN Nicholes Mango, MD   400 mg at 09/28/15 1626  . insulin aspart (novoLOG) injection 0-5 Units  0-5 Units Subcutaneous QHS Loletha Grayer, MD   2 Units at 09/26/15 2201  . insulin aspart (novoLOG) injection 0-9 Units  0-9 Units Subcutaneous TID WC Loletha Grayer, MD   2 Units at 09/29/15 1212  . insulin glargine (LANTUS) injection 40 Units  40 Units Subcutaneous Daily Nicholes Mango, MD   40 Units at 09/29/15 0957  . lactulose (CHRONULAC) 10 GM/15ML solution 30 g  30 g Oral BID Nicholes Mango, MD   30 g at 09/29/15 0958  . linagliptin (TRADJENTA) tablet  5 mg  5 mg Oral Daily Paulette Blanch, MD   5 mg at 09/29/15 [id number]  . losartan (COZAAR) tablet 25 mg  25 mg Oral Daily Paulette Blanch, MD   25 mg at 09/29/15 [id number]  . memantine (NAMENDA XR) 24 hr capsule 28 mg  28 mg Oral Daily Paulette Blanch, MD   28 mg at 09/29/15 [id number]  . morphine 2 MG/ML injection 2 mg  2 mg Intravenous Q4H PRN Nicholes Mango, MD      . traMADol (ULTRAM) tablet 50 mg  50 mg Oral 4 times per day Paulette Blanch, MD   50 mg at 09/29/15 1211     Discharge Medications: Please see discharge summary for a list of discharge medications.  Relevant Imaging Results:  Relevant Lab Results:   Additional Information  (SSN: [id number])  Loralyn Freshwater, [name]

## 2015-09-29 NOTE — Progress Notes (Addendum)
Zack clinical social work Surveyor, quantity approved 30 day LOG. [facility] admissions coordinator has agreed to take patient today with 30 day LOG. Per Helene Kelp patient will go to room 92-A. RN will call report and arrange EMS for transport. Clinical Education officer, museum (CSW) sent D/C Summary, FL2 and D/C Packet to [employer] via [employer]. Patient is aware of above. Per patient he is willing to sign his social security check over to [employer]. CSW contacted patient's daughter Maudie Mercury and made her aware of above. Per daughter she is working on [employer] application. CSW left Mikki Santee with [facility]PS a voicemail making him aware of above. Please reconsult if future social work needs arise. CSW signing off.   Blima Rich, [name] [phone]

## 2015-09-30 DIAGNOSIS — E1165 Type 2 diabetes mellitus with hyperglycemia: Secondary | ICD-10-CM | POA: Diagnosis not present

## 2015-09-30 DIAGNOSIS — S22058A Other fracture of T5-T6 vertebra, initial encounter for closed fracture: Secondary | ICD-10-CM | POA: Diagnosis not present

## 2015-09-30 DIAGNOSIS — Z9181 History of falling: Secondary | ICD-10-CM | POA: Diagnosis not present

## 2015-09-30 DIAGNOSIS — M159 Polyosteoarthritis, unspecified: Secondary | ICD-10-CM | POA: Diagnosis not present

## 2015-09-30 DIAGNOSIS — M79604 Pain in right leg: Secondary | ICD-10-CM | POA: Diagnosis not present

## 2015-09-30 DIAGNOSIS — M6281 Muscle weakness (generalized): Secondary | ICD-10-CM | POA: Diagnosis not present

## 2015-09-30 DIAGNOSIS — M545 Low back pain: Secondary | ICD-10-CM | POA: Diagnosis not present

## 2015-09-30 DIAGNOSIS — R262 Difficulty in walking, not elsewhere classified: Secondary | ICD-10-CM | POA: Diagnosis not present

## 2015-10-01 DIAGNOSIS — M159 Polyosteoarthritis, unspecified: Secondary | ICD-10-CM | POA: Diagnosis not present

## 2015-10-01 DIAGNOSIS — Z9181 History of falling: Secondary | ICD-10-CM | POA: Diagnosis not present

## 2015-10-01 DIAGNOSIS — M6281 Muscle weakness (generalized): Secondary | ICD-10-CM | POA: Diagnosis not present

## 2015-10-01 DIAGNOSIS — M545 Low back pain: Secondary | ICD-10-CM | POA: Diagnosis not present

## 2015-10-01 DIAGNOSIS — R262 Difficulty in walking, not elsewhere classified: Secondary | ICD-10-CM | POA: Diagnosis not present

## 2015-10-01 DIAGNOSIS — E1165 Type 2 diabetes mellitus with hyperglycemia: Secondary | ICD-10-CM | POA: Diagnosis not present

## 2015-10-01 DIAGNOSIS — M79604 Pain in right leg: Secondary | ICD-10-CM | POA: Diagnosis not present

## 2015-10-01 DIAGNOSIS — S22058A Other fracture of T5-T6 vertebra, initial encounter for closed fracture: Secondary | ICD-10-CM | POA: Diagnosis not present

## 2015-10-02 DIAGNOSIS — M6281 Muscle weakness (generalized): Secondary | ICD-10-CM | POA: Diagnosis not present

## 2015-10-02 DIAGNOSIS — M159 Polyosteoarthritis, unspecified: Secondary | ICD-10-CM | POA: Diagnosis not present

## 2015-10-02 DIAGNOSIS — M79604 Pain in right leg: Secondary | ICD-10-CM | POA: Diagnosis not present

## 2015-10-02 DIAGNOSIS — M545 Low back pain: Secondary | ICD-10-CM | POA: Diagnosis not present

## 2015-10-02 DIAGNOSIS — E1165 Type 2 diabetes mellitus with hyperglycemia: Secondary | ICD-10-CM | POA: Diagnosis not present

## 2015-10-02 DIAGNOSIS — R262 Difficulty in walking, not elsewhere classified: Secondary | ICD-10-CM | POA: Diagnosis not present

## 2015-10-03 ENCOUNTER — Other Ambulatory Visit: Payer: Self-pay | Admitting: *Deleted

## 2015-10-03 ENCOUNTER — Encounter: Payer: Self-pay | Admitting: *Deleted

## 2015-10-03 DIAGNOSIS — M6281 Muscle weakness (generalized): Secondary | ICD-10-CM | POA: Diagnosis not present

## 2015-10-03 DIAGNOSIS — E1165 Type 2 diabetes mellitus with hyperglycemia: Secondary | ICD-10-CM | POA: Diagnosis not present

## 2015-10-03 DIAGNOSIS — M545 Low back pain: Secondary | ICD-10-CM | POA: Diagnosis not present

## 2015-10-03 DIAGNOSIS — M79604 Pain in right leg: Secondary | ICD-10-CM | POA: Diagnosis not present

## 2015-10-03 DIAGNOSIS — R262 Difficulty in walking, not elsewhere classified: Secondary | ICD-10-CM | POA: Diagnosis not present

## 2015-10-03 DIAGNOSIS — M159 Polyosteoarthritis, unspecified: Secondary | ICD-10-CM | POA: Diagnosis not present

## 2015-10-03 NOTE — Patient Outreach (Signed)
[facility]) Care Management  10/03/2015  Daniel [name] 1927/12/12 [id number]   Case closure performed as it has be confirmed by hospital liaison [facility] and care management assistant that patient is no longer eligible for [facility] services.  Patient is not on the all payer list.   [name] [facility] Care Management [phone]

## 2015-10-05 DIAGNOSIS — M79604 Pain in right leg: Secondary | ICD-10-CM | POA: Diagnosis not present

## 2015-10-05 DIAGNOSIS — M545 Low back pain: Secondary | ICD-10-CM | POA: Diagnosis not present

## 2015-10-05 DIAGNOSIS — M159 Polyosteoarthritis, unspecified: Secondary | ICD-10-CM | POA: Diagnosis not present

## 2015-10-05 DIAGNOSIS — E1165 Type 2 diabetes mellitus with hyperglycemia: Secondary | ICD-10-CM | POA: Diagnosis not present

## 2015-10-05 DIAGNOSIS — M6281 Muscle weakness (generalized): Secondary | ICD-10-CM | POA: Diagnosis not present

## 2015-10-05 DIAGNOSIS — R262 Difficulty in walking, not elsewhere classified: Secondary | ICD-10-CM | POA: Diagnosis not present

## 2015-10-06 ENCOUNTER — Other Ambulatory Visit: Payer: Self-pay | Admitting: *Deleted

## 2015-10-06 DIAGNOSIS — R262 Difficulty in walking, not elsewhere classified: Secondary | ICD-10-CM | POA: Diagnosis not present

## 2015-10-06 DIAGNOSIS — E1165 Type 2 diabetes mellitus with hyperglycemia: Secondary | ICD-10-CM | POA: Diagnosis not present

## 2015-10-06 DIAGNOSIS — M545 Low back pain: Secondary | ICD-10-CM | POA: Diagnosis not present

## 2015-10-06 DIAGNOSIS — M79604 Pain in right leg: Secondary | ICD-10-CM | POA: Diagnosis not present

## 2015-10-06 DIAGNOSIS — M159 Polyosteoarthritis, unspecified: Secondary | ICD-10-CM | POA: Diagnosis not present

## 2015-10-06 DIAGNOSIS — M6281 Muscle weakness (generalized): Secondary | ICD-10-CM | POA: Diagnosis not present

## 2015-10-06 NOTE — Patient Outreach (Addendum)
Informed by hospital liason RN CM pt no longer eligible for [facility] services.   View in [place]- case closure letter sent to MD 3/3 by [facility] CSW.        Zara Chess.   [facility] Care Management  [phone]

## 2015-10-07 DIAGNOSIS — M79604 Pain in right leg: Secondary | ICD-10-CM | POA: Diagnosis not present

## 2015-10-07 DIAGNOSIS — E1165 Type 2 diabetes mellitus with hyperglycemia: Secondary | ICD-10-CM | POA: Diagnosis not present

## 2015-10-07 DIAGNOSIS — M159 Polyosteoarthritis, unspecified: Secondary | ICD-10-CM | POA: Diagnosis not present

## 2015-10-07 DIAGNOSIS — M6281 Muscle weakness (generalized): Secondary | ICD-10-CM | POA: Diagnosis not present

## 2015-10-07 DIAGNOSIS — M545 Low back pain: Secondary | ICD-10-CM | POA: Diagnosis not present

## 2015-10-07 DIAGNOSIS — R262 Difficulty in walking, not elsewhere classified: Secondary | ICD-10-CM | POA: Diagnosis not present

## 2015-10-08 DIAGNOSIS — M79604 Pain in right leg: Secondary | ICD-10-CM | POA: Diagnosis not present

## 2015-10-08 DIAGNOSIS — M159 Polyosteoarthritis, unspecified: Secondary | ICD-10-CM | POA: Diagnosis not present

## 2015-10-08 DIAGNOSIS — E1165 Type 2 diabetes mellitus with hyperglycemia: Secondary | ICD-10-CM | POA: Diagnosis not present

## 2015-10-08 DIAGNOSIS — M6281 Muscle weakness (generalized): Secondary | ICD-10-CM | POA: Diagnosis not present

## 2015-10-08 DIAGNOSIS — R262 Difficulty in walking, not elsewhere classified: Secondary | ICD-10-CM | POA: Diagnosis not present

## 2015-10-08 DIAGNOSIS — M545 Low back pain: Secondary | ICD-10-CM | POA: Diagnosis not present

## 2015-10-09 DIAGNOSIS — E1165 Type 2 diabetes mellitus with hyperglycemia: Secondary | ICD-10-CM | POA: Diagnosis not present

## 2015-10-09 DIAGNOSIS — M6281 Muscle weakness (generalized): Secondary | ICD-10-CM | POA: Diagnosis not present

## 2015-10-09 DIAGNOSIS — M159 Polyosteoarthritis, unspecified: Secondary | ICD-10-CM | POA: Diagnosis not present

## 2015-10-09 DIAGNOSIS — M545 Low back pain: Secondary | ICD-10-CM | POA: Diagnosis not present

## 2015-10-09 DIAGNOSIS — R262 Difficulty in walking, not elsewhere classified: Secondary | ICD-10-CM | POA: Diagnosis not present

## 2015-10-09 DIAGNOSIS — M79604 Pain in right leg: Secondary | ICD-10-CM | POA: Diagnosis not present

## 2015-10-10 DIAGNOSIS — M6281 Muscle weakness (generalized): Secondary | ICD-10-CM | POA: Diagnosis not present

## 2015-10-10 DIAGNOSIS — R262 Difficulty in walking, not elsewhere classified: Secondary | ICD-10-CM | POA: Diagnosis not present

## 2015-10-10 DIAGNOSIS — M79604 Pain in right leg: Secondary | ICD-10-CM | POA: Diagnosis not present

## 2015-10-10 DIAGNOSIS — M159 Polyosteoarthritis, unspecified: Secondary | ICD-10-CM | POA: Diagnosis not present

## 2015-10-10 DIAGNOSIS — E1165 Type 2 diabetes mellitus with hyperglycemia: Secondary | ICD-10-CM | POA: Diagnosis not present

## 2015-10-10 DIAGNOSIS — M545 Low back pain: Secondary | ICD-10-CM | POA: Diagnosis not present

## 2015-10-11 DIAGNOSIS — M6281 Muscle weakness (generalized): Secondary | ICD-10-CM | POA: Diagnosis not present

## 2015-10-11 DIAGNOSIS — M545 Low back pain: Secondary | ICD-10-CM | POA: Diagnosis not present

## 2015-10-11 DIAGNOSIS — R262 Difficulty in walking, not elsewhere classified: Secondary | ICD-10-CM | POA: Diagnosis not present

## 2015-10-11 DIAGNOSIS — E1165 Type 2 diabetes mellitus with hyperglycemia: Secondary | ICD-10-CM | POA: Diagnosis not present

## 2015-10-11 DIAGNOSIS — M159 Polyosteoarthritis, unspecified: Secondary | ICD-10-CM | POA: Diagnosis not present

## 2015-10-11 DIAGNOSIS — M79604 Pain in right leg: Secondary | ICD-10-CM | POA: Diagnosis not present

## 2015-10-13 DIAGNOSIS — R262 Difficulty in walking, not elsewhere classified: Secondary | ICD-10-CM | POA: Diagnosis not present

## 2015-10-13 DIAGNOSIS — M6281 Muscle weakness (generalized): Secondary | ICD-10-CM | POA: Diagnosis not present

## 2015-10-13 DIAGNOSIS — E1165 Type 2 diabetes mellitus with hyperglycemia: Secondary | ICD-10-CM | POA: Diagnosis not present

## 2015-10-13 DIAGNOSIS — M79604 Pain in right leg: Secondary | ICD-10-CM | POA: Diagnosis not present

## 2015-10-13 DIAGNOSIS — M159 Polyosteoarthritis, unspecified: Secondary | ICD-10-CM | POA: Diagnosis not present

## 2015-10-13 DIAGNOSIS — M545 Low back pain: Secondary | ICD-10-CM | POA: Diagnosis not present

## 2015-10-14 DIAGNOSIS — M159 Polyosteoarthritis, unspecified: Secondary | ICD-10-CM | POA: Diagnosis not present

## 2015-10-14 DIAGNOSIS — E1165 Type 2 diabetes mellitus with hyperglycemia: Secondary | ICD-10-CM | POA: Diagnosis not present

## 2015-10-14 DIAGNOSIS — M79604 Pain in right leg: Secondary | ICD-10-CM | POA: Diagnosis not present

## 2015-10-14 DIAGNOSIS — M545 Low back pain: Secondary | ICD-10-CM | POA: Diagnosis not present

## 2015-10-14 DIAGNOSIS — R262 Difficulty in walking, not elsewhere classified: Secondary | ICD-10-CM | POA: Diagnosis not present

## 2015-10-14 DIAGNOSIS — M6281 Muscle weakness (generalized): Secondary | ICD-10-CM | POA: Diagnosis not present

## 2015-10-15 DIAGNOSIS — M79604 Pain in right leg: Secondary | ICD-10-CM | POA: Diagnosis not present

## 2015-10-15 DIAGNOSIS — M6281 Muscle weakness (generalized): Secondary | ICD-10-CM | POA: Diagnosis not present

## 2015-10-15 DIAGNOSIS — E1165 Type 2 diabetes mellitus with hyperglycemia: Secondary | ICD-10-CM | POA: Diagnosis not present

## 2015-10-15 DIAGNOSIS — M545 Low back pain: Secondary | ICD-10-CM | POA: Diagnosis not present

## 2015-10-15 DIAGNOSIS — R262 Difficulty in walking, not elsewhere classified: Secondary | ICD-10-CM | POA: Diagnosis not present

## 2015-10-15 DIAGNOSIS — M159 Polyosteoarthritis, unspecified: Secondary | ICD-10-CM | POA: Diagnosis not present

## 2015-10-16 DIAGNOSIS — M545 Low back pain: Secondary | ICD-10-CM | POA: Diagnosis not present

## 2015-10-16 DIAGNOSIS — R262 Difficulty in walking, not elsewhere classified: Secondary | ICD-10-CM | POA: Diagnosis not present

## 2015-10-16 DIAGNOSIS — E1165 Type 2 diabetes mellitus with hyperglycemia: Secondary | ICD-10-CM | POA: Diagnosis not present

## 2015-10-16 DIAGNOSIS — M159 Polyosteoarthritis, unspecified: Secondary | ICD-10-CM | POA: Diagnosis not present

## 2015-10-16 DIAGNOSIS — M79604 Pain in right leg: Secondary | ICD-10-CM | POA: Diagnosis not present

## 2015-10-16 DIAGNOSIS — M6281 Muscle weakness (generalized): Secondary | ICD-10-CM | POA: Diagnosis not present

## 2015-10-17 DIAGNOSIS — M545 Low back pain: Secondary | ICD-10-CM | POA: Diagnosis not present

## 2015-10-17 DIAGNOSIS — M159 Polyosteoarthritis, unspecified: Secondary | ICD-10-CM | POA: Diagnosis not present

## 2015-10-17 DIAGNOSIS — E1165 Type 2 diabetes mellitus with hyperglycemia: Secondary | ICD-10-CM | POA: Diagnosis not present

## 2015-10-17 DIAGNOSIS — R262 Difficulty in walking, not elsewhere classified: Secondary | ICD-10-CM | POA: Diagnosis not present

## 2015-10-17 DIAGNOSIS — M6281 Muscle weakness (generalized): Secondary | ICD-10-CM | POA: Diagnosis not present

## 2015-10-17 DIAGNOSIS — M79604 Pain in right leg: Secondary | ICD-10-CM | POA: Diagnosis not present

## 2015-10-17 DIAGNOSIS — E119 Type 2 diabetes mellitus without complications: Secondary | ICD-10-CM | POA: Diagnosis not present

## 2015-10-17 DIAGNOSIS — F0390 Unspecified dementia without behavioral disturbance: Secondary | ICD-10-CM | POA: Diagnosis not present

## 2015-10-17 DIAGNOSIS — N182 Chronic kidney disease, stage 2 (mild): Secondary | ICD-10-CM | POA: Diagnosis not present

## 2015-10-18 DIAGNOSIS — M6281 Muscle weakness (generalized): Secondary | ICD-10-CM | POA: Diagnosis not present

## 2015-10-18 DIAGNOSIS — E1165 Type 2 diabetes mellitus with hyperglycemia: Secondary | ICD-10-CM | POA: Diagnosis not present

## 2015-10-18 DIAGNOSIS — M159 Polyosteoarthritis, unspecified: Secondary | ICD-10-CM | POA: Diagnosis not present

## 2015-10-18 DIAGNOSIS — M79604 Pain in right leg: Secondary | ICD-10-CM | POA: Diagnosis not present

## 2015-10-18 DIAGNOSIS — R262 Difficulty in walking, not elsewhere classified: Secondary | ICD-10-CM | POA: Diagnosis not present

## 2015-10-18 DIAGNOSIS — M545 Low back pain: Secondary | ICD-10-CM | POA: Diagnosis not present

## 2015-10-20 DIAGNOSIS — M79604 Pain in right leg: Secondary | ICD-10-CM | POA: Diagnosis not present

## 2015-10-20 DIAGNOSIS — E1165 Type 2 diabetes mellitus with hyperglycemia: Secondary | ICD-10-CM | POA: Diagnosis not present

## 2015-10-20 DIAGNOSIS — M6281 Muscle weakness (generalized): Secondary | ICD-10-CM | POA: Diagnosis not present

## 2015-10-20 DIAGNOSIS — M159 Polyosteoarthritis, unspecified: Secondary | ICD-10-CM | POA: Diagnosis not present

## 2015-10-20 DIAGNOSIS — M545 Low back pain: Secondary | ICD-10-CM | POA: Diagnosis not present

## 2015-10-20 DIAGNOSIS — R262 Difficulty in walking, not elsewhere classified: Secondary | ICD-10-CM | POA: Diagnosis not present

## 2015-10-21 DIAGNOSIS — M545 Low back pain: Secondary | ICD-10-CM | POA: Diagnosis not present

## 2015-10-21 DIAGNOSIS — E1165 Type 2 diabetes mellitus with hyperglycemia: Secondary | ICD-10-CM | POA: Diagnosis not present

## 2015-10-21 DIAGNOSIS — S82841A Displaced bimalleolar fracture of right lower leg, initial encounter for closed fracture: Secondary | ICD-10-CM | POA: Diagnosis not present

## 2015-10-21 DIAGNOSIS — M79604 Pain in right leg: Secondary | ICD-10-CM | POA: Diagnosis not present

## 2015-10-21 DIAGNOSIS — R262 Difficulty in walking, not elsewhere classified: Secondary | ICD-10-CM | POA: Diagnosis not present

## 2015-10-21 DIAGNOSIS — M159 Polyosteoarthritis, unspecified: Secondary | ICD-10-CM | POA: Diagnosis not present

## 2015-10-21 DIAGNOSIS — M6281 Muscle weakness (generalized): Secondary | ICD-10-CM | POA: Diagnosis not present

## 2015-10-21 DIAGNOSIS — M25571 Pain in right ankle and joints of right foot: Secondary | ICD-10-CM | POA: Diagnosis not present

## 2015-10-22 DIAGNOSIS — M79604 Pain in right leg: Secondary | ICD-10-CM | POA: Diagnosis not present

## 2015-10-22 DIAGNOSIS — R262 Difficulty in walking, not elsewhere classified: Secondary | ICD-10-CM | POA: Diagnosis not present

## 2015-10-22 DIAGNOSIS — M6281 Muscle weakness (generalized): Secondary | ICD-10-CM | POA: Diagnosis not present

## 2015-10-22 DIAGNOSIS — M159 Polyosteoarthritis, unspecified: Secondary | ICD-10-CM | POA: Diagnosis not present

## 2015-10-22 DIAGNOSIS — M545 Low back pain: Secondary | ICD-10-CM | POA: Diagnosis not present

## 2015-10-22 DIAGNOSIS — E1165 Type 2 diabetes mellitus with hyperglycemia: Secondary | ICD-10-CM | POA: Diagnosis not present

## 2015-10-23 DIAGNOSIS — M6281 Muscle weakness (generalized): Secondary | ICD-10-CM | POA: Diagnosis not present

## 2015-10-23 DIAGNOSIS — R262 Difficulty in walking, not elsewhere classified: Secondary | ICD-10-CM | POA: Diagnosis not present

## 2015-10-23 DIAGNOSIS — M79604 Pain in right leg: Secondary | ICD-10-CM | POA: Diagnosis not present

## 2015-10-23 DIAGNOSIS — E1165 Type 2 diabetes mellitus with hyperglycemia: Secondary | ICD-10-CM | POA: Diagnosis not present

## 2015-10-23 DIAGNOSIS — M159 Polyosteoarthritis, unspecified: Secondary | ICD-10-CM | POA: Diagnosis not present

## 2015-10-23 DIAGNOSIS — M545 Low back pain: Secondary | ICD-10-CM | POA: Diagnosis not present

## 2015-10-24 DIAGNOSIS — E1165 Type 2 diabetes mellitus with hyperglycemia: Secondary | ICD-10-CM | POA: Diagnosis not present

## 2015-10-24 DIAGNOSIS — M159 Polyosteoarthritis, unspecified: Secondary | ICD-10-CM | POA: Diagnosis not present

## 2015-10-24 DIAGNOSIS — M6281 Muscle weakness (generalized): Secondary | ICD-10-CM | POA: Diagnosis not present

## 2015-10-24 DIAGNOSIS — M79604 Pain in right leg: Secondary | ICD-10-CM | POA: Diagnosis not present

## 2015-10-24 DIAGNOSIS — R262 Difficulty in walking, not elsewhere classified: Secondary | ICD-10-CM | POA: Diagnosis not present

## 2015-10-24 DIAGNOSIS — M545 Low back pain: Secondary | ICD-10-CM | POA: Diagnosis not present

## 2015-10-25 DIAGNOSIS — E1165 Type 2 diabetes mellitus with hyperglycemia: Secondary | ICD-10-CM | POA: Diagnosis not present

## 2015-10-25 DIAGNOSIS — M545 Low back pain: Secondary | ICD-10-CM | POA: Diagnosis not present

## 2015-10-25 DIAGNOSIS — M159 Polyosteoarthritis, unspecified: Secondary | ICD-10-CM | POA: Diagnosis not present

## 2015-10-25 DIAGNOSIS — R262 Difficulty in walking, not elsewhere classified: Secondary | ICD-10-CM | POA: Diagnosis not present

## 2015-10-25 DIAGNOSIS — M6281 Muscle weakness (generalized): Secondary | ICD-10-CM | POA: Diagnosis not present

## 2015-10-25 DIAGNOSIS — M79604 Pain in right leg: Secondary | ICD-10-CM | POA: Diagnosis not present

## 2015-10-27 DIAGNOSIS — E1165 Type 2 diabetes mellitus with hyperglycemia: Secondary | ICD-10-CM | POA: Diagnosis not present

## 2015-10-27 DIAGNOSIS — M6281 Muscle weakness (generalized): Secondary | ICD-10-CM | POA: Diagnosis not present

## 2015-10-27 DIAGNOSIS — M159 Polyosteoarthritis, unspecified: Secondary | ICD-10-CM | POA: Diagnosis not present

## 2015-10-27 DIAGNOSIS — M545 Low back pain: Secondary | ICD-10-CM | POA: Diagnosis not present

## 2015-10-27 DIAGNOSIS — M79604 Pain in right leg: Secondary | ICD-10-CM | POA: Diagnosis not present

## 2015-10-27 DIAGNOSIS — R262 Difficulty in walking, not elsewhere classified: Secondary | ICD-10-CM | POA: Diagnosis not present

## 2015-10-28 DIAGNOSIS — E1165 Type 2 diabetes mellitus with hyperglycemia: Secondary | ICD-10-CM | POA: Diagnosis not present

## 2015-10-28 DIAGNOSIS — M159 Polyosteoarthritis, unspecified: Secondary | ICD-10-CM | POA: Diagnosis not present

## 2015-10-28 DIAGNOSIS — M79604 Pain in right leg: Secondary | ICD-10-CM | POA: Diagnosis not present

## 2015-10-28 DIAGNOSIS — R262 Difficulty in walking, not elsewhere classified: Secondary | ICD-10-CM | POA: Diagnosis not present

## 2015-10-28 DIAGNOSIS — M6281 Muscle weakness (generalized): Secondary | ICD-10-CM | POA: Diagnosis not present

## 2015-10-28 DIAGNOSIS — M545 Low back pain: Secondary | ICD-10-CM | POA: Diagnosis not present

## 2015-10-29 DIAGNOSIS — M79604 Pain in right leg: Secondary | ICD-10-CM | POA: Diagnosis not present

## 2015-10-29 DIAGNOSIS — E1165 Type 2 diabetes mellitus with hyperglycemia: Secondary | ICD-10-CM | POA: Diagnosis not present

## 2015-10-29 DIAGNOSIS — M159 Polyosteoarthritis, unspecified: Secondary | ICD-10-CM | POA: Diagnosis not present

## 2015-10-29 DIAGNOSIS — M545 Low back pain: Secondary | ICD-10-CM | POA: Diagnosis not present

## 2015-10-29 DIAGNOSIS — R262 Difficulty in walking, not elsewhere classified: Secondary | ICD-10-CM | POA: Diagnosis not present

## 2015-10-29 DIAGNOSIS — M6281 Muscle weakness (generalized): Secondary | ICD-10-CM | POA: Diagnosis not present

## 2015-10-30 DIAGNOSIS — M6281 Muscle weakness (generalized): Secondary | ICD-10-CM | POA: Diagnosis not present

## 2015-10-30 DIAGNOSIS — M79604 Pain in right leg: Secondary | ICD-10-CM | POA: Diagnosis not present

## 2015-10-30 DIAGNOSIS — M159 Polyosteoarthritis, unspecified: Secondary | ICD-10-CM | POA: Diagnosis not present

## 2015-10-30 DIAGNOSIS — E1165 Type 2 diabetes mellitus with hyperglycemia: Secondary | ICD-10-CM | POA: Diagnosis not present

## 2015-10-30 DIAGNOSIS — D649 Anemia, unspecified: Secondary | ICD-10-CM | POA: Diagnosis not present

## 2015-10-30 DIAGNOSIS — M545 Low back pain: Secondary | ICD-10-CM | POA: Diagnosis not present

## 2015-10-30 DIAGNOSIS — R262 Difficulty in walking, not elsewhere classified: Secondary | ICD-10-CM | POA: Diagnosis not present

## 2015-10-31 DIAGNOSIS — M79604 Pain in right leg: Secondary | ICD-10-CM | POA: Diagnosis not present

## 2015-10-31 DIAGNOSIS — M6281 Muscle weakness (generalized): Secondary | ICD-10-CM | POA: Diagnosis not present

## 2015-10-31 DIAGNOSIS — M545 Low back pain: Secondary | ICD-10-CM | POA: Diagnosis not present

## 2015-10-31 DIAGNOSIS — R262 Difficulty in walking, not elsewhere classified: Secondary | ICD-10-CM | POA: Diagnosis not present

## 2015-10-31 DIAGNOSIS — M159 Polyosteoarthritis, unspecified: Secondary | ICD-10-CM | POA: Diagnosis not present

## 2015-10-31 DIAGNOSIS — E1165 Type 2 diabetes mellitus with hyperglycemia: Secondary | ICD-10-CM | POA: Diagnosis not present

## 2015-11-01 DIAGNOSIS — S22059D Unspecified fracture of T5-T6 vertebra, subsequent encounter for fracture with routine healing: Secondary | ICD-10-CM | POA: Diagnosis not present

## 2015-11-01 DIAGNOSIS — M79604 Pain in right leg: Secondary | ICD-10-CM | POA: Diagnosis not present

## 2015-11-01 DIAGNOSIS — R262 Difficulty in walking, not elsewhere classified: Secondary | ICD-10-CM | POA: Diagnosis not present

## 2015-11-01 DIAGNOSIS — Z9181 History of falling: Secondary | ICD-10-CM | POA: Diagnosis not present

## 2015-11-01 DIAGNOSIS — M6281 Muscle weakness (generalized): Secondary | ICD-10-CM | POA: Diagnosis not present

## 2015-11-01 DIAGNOSIS — S22058D Other fracture of T5-T6 vertebra, subsequent encounter for fracture with routine healing: Secondary | ICD-10-CM | POA: Diagnosis not present

## 2015-11-03 DIAGNOSIS — S22058D Other fracture of T5-T6 vertebra, subsequent encounter for fracture with routine healing: Secondary | ICD-10-CM | POA: Diagnosis not present

## 2015-11-03 DIAGNOSIS — M79604 Pain in right leg: Secondary | ICD-10-CM | POA: Diagnosis not present

## 2015-11-03 DIAGNOSIS — Z9181 History of falling: Secondary | ICD-10-CM | POA: Diagnosis not present

## 2015-11-03 DIAGNOSIS — M6281 Muscle weakness (generalized): Secondary | ICD-10-CM | POA: Diagnosis not present

## 2015-11-03 DIAGNOSIS — R262 Difficulty in walking, not elsewhere classified: Secondary | ICD-10-CM | POA: Diagnosis not present

## 2015-11-03 DIAGNOSIS — S22059D Unspecified fracture of T5-T6 vertebra, subsequent encounter for fracture with routine healing: Secondary | ICD-10-CM | POA: Diagnosis not present

## 2015-11-04 DIAGNOSIS — S22059D Unspecified fracture of T5-T6 vertebra, subsequent encounter for fracture with routine healing: Secondary | ICD-10-CM | POA: Diagnosis not present

## 2015-11-04 DIAGNOSIS — R262 Difficulty in walking, not elsewhere classified: Secondary | ICD-10-CM | POA: Diagnosis not present

## 2015-11-04 DIAGNOSIS — S22058D Other fracture of T5-T6 vertebra, subsequent encounter for fracture with routine healing: Secondary | ICD-10-CM | POA: Diagnosis not present

## 2015-11-04 DIAGNOSIS — M6281 Muscle weakness (generalized): Secondary | ICD-10-CM | POA: Diagnosis not present

## 2015-11-04 DIAGNOSIS — Z9181 History of falling: Secondary | ICD-10-CM | POA: Diagnosis not present

## 2015-11-04 DIAGNOSIS — M79604 Pain in right leg: Secondary | ICD-10-CM | POA: Diagnosis not present

## 2015-11-05 DIAGNOSIS — S22058D Other fracture of T5-T6 vertebra, subsequent encounter for fracture with routine healing: Secondary | ICD-10-CM | POA: Diagnosis not present

## 2015-11-05 DIAGNOSIS — M79604 Pain in right leg: Secondary | ICD-10-CM | POA: Diagnosis not present

## 2015-11-05 DIAGNOSIS — S22059D Unspecified fracture of T5-T6 vertebra, subsequent encounter for fracture with routine healing: Secondary | ICD-10-CM | POA: Diagnosis not present

## 2015-11-05 DIAGNOSIS — Z9181 History of falling: Secondary | ICD-10-CM | POA: Diagnosis not present

## 2015-11-05 DIAGNOSIS — M6281 Muscle weakness (generalized): Secondary | ICD-10-CM | POA: Diagnosis not present

## 2015-11-05 DIAGNOSIS — R262 Difficulty in walking, not elsewhere classified: Secondary | ICD-10-CM | POA: Diagnosis not present

## 2015-11-06 DIAGNOSIS — S22059D Unspecified fracture of T5-T6 vertebra, subsequent encounter for fracture with routine healing: Secondary | ICD-10-CM | POA: Diagnosis not present

## 2015-11-06 DIAGNOSIS — S22058D Other fracture of T5-T6 vertebra, subsequent encounter for fracture with routine healing: Secondary | ICD-10-CM | POA: Diagnosis not present

## 2015-11-06 DIAGNOSIS — M6281 Muscle weakness (generalized): Secondary | ICD-10-CM | POA: Diagnosis not present

## 2015-11-06 DIAGNOSIS — M79604 Pain in right leg: Secondary | ICD-10-CM | POA: Diagnosis not present

## 2015-11-06 DIAGNOSIS — R262 Difficulty in walking, not elsewhere classified: Secondary | ICD-10-CM | POA: Diagnosis not present

## 2015-11-06 DIAGNOSIS — Z9181 History of falling: Secondary | ICD-10-CM | POA: Diagnosis not present

## 2015-11-07 DIAGNOSIS — M79604 Pain in right leg: Secondary | ICD-10-CM | POA: Diagnosis not present

## 2015-11-07 DIAGNOSIS — S22059D Unspecified fracture of T5-T6 vertebra, subsequent encounter for fracture with routine healing: Secondary | ICD-10-CM | POA: Diagnosis not present

## 2015-11-07 DIAGNOSIS — R262 Difficulty in walking, not elsewhere classified: Secondary | ICD-10-CM | POA: Diagnosis not present

## 2015-11-07 DIAGNOSIS — Z9181 History of falling: Secondary | ICD-10-CM | POA: Diagnosis not present

## 2015-11-07 DIAGNOSIS — M6281 Muscle weakness (generalized): Secondary | ICD-10-CM | POA: Diagnosis not present

## 2015-11-07 DIAGNOSIS — S22058D Other fracture of T5-T6 vertebra, subsequent encounter for fracture with routine healing: Secondary | ICD-10-CM | POA: Diagnosis not present

## 2015-11-10 DIAGNOSIS — R262 Difficulty in walking, not elsewhere classified: Secondary | ICD-10-CM | POA: Diagnosis not present

## 2015-11-10 DIAGNOSIS — M6281 Muscle weakness (generalized): Secondary | ICD-10-CM | POA: Diagnosis not present

## 2015-11-10 DIAGNOSIS — Z9181 History of falling: Secondary | ICD-10-CM | POA: Diagnosis not present

## 2015-11-10 DIAGNOSIS — S22058D Other fracture of T5-T6 vertebra, subsequent encounter for fracture with routine healing: Secondary | ICD-10-CM | POA: Diagnosis not present

## 2015-11-10 DIAGNOSIS — M79604 Pain in right leg: Secondary | ICD-10-CM | POA: Diagnosis not present

## 2015-11-10 DIAGNOSIS — S22059D Unspecified fracture of T5-T6 vertebra, subsequent encounter for fracture with routine healing: Secondary | ICD-10-CM | POA: Diagnosis not present

## 2015-11-11 DIAGNOSIS — Z9181 History of falling: Secondary | ICD-10-CM | POA: Diagnosis not present

## 2015-11-11 DIAGNOSIS — M79604 Pain in right leg: Secondary | ICD-10-CM | POA: Diagnosis not present

## 2015-11-11 DIAGNOSIS — S22058D Other fracture of T5-T6 vertebra, subsequent encounter for fracture with routine healing: Secondary | ICD-10-CM | POA: Diagnosis not present

## 2015-11-11 DIAGNOSIS — S22059D Unspecified fracture of T5-T6 vertebra, subsequent encounter for fracture with routine healing: Secondary | ICD-10-CM | POA: Diagnosis not present

## 2015-11-11 DIAGNOSIS — R262 Difficulty in walking, not elsewhere classified: Secondary | ICD-10-CM | POA: Diagnosis not present

## 2015-11-11 DIAGNOSIS — M6281 Muscle weakness (generalized): Secondary | ICD-10-CM | POA: Diagnosis not present

## 2015-11-12 DIAGNOSIS — Z9181 History of falling: Secondary | ICD-10-CM | POA: Diagnosis not present

## 2015-11-12 DIAGNOSIS — S22059D Unspecified fracture of T5-T6 vertebra, subsequent encounter for fracture with routine healing: Secondary | ICD-10-CM | POA: Diagnosis not present

## 2015-11-12 DIAGNOSIS — S22058D Other fracture of T5-T6 vertebra, subsequent encounter for fracture with routine healing: Secondary | ICD-10-CM | POA: Diagnosis not present

## 2015-11-12 DIAGNOSIS — M79604 Pain in right leg: Secondary | ICD-10-CM | POA: Diagnosis not present

## 2015-11-12 DIAGNOSIS — R262 Difficulty in walking, not elsewhere classified: Secondary | ICD-10-CM | POA: Diagnosis not present

## 2015-11-12 DIAGNOSIS — M6281 Muscle weakness (generalized): Secondary | ICD-10-CM | POA: Diagnosis not present

## 2015-11-13 DIAGNOSIS — S82841D Displaced bimalleolar fracture of right lower leg, subsequent encounter for closed fracture with routine healing: Secondary | ICD-10-CM | POA: Diagnosis not present

## 2015-11-13 DIAGNOSIS — S22059D Unspecified fracture of T5-T6 vertebra, subsequent encounter for fracture with routine healing: Secondary | ICD-10-CM | POA: Diagnosis not present

## 2015-11-13 DIAGNOSIS — M79604 Pain in right leg: Secondary | ICD-10-CM | POA: Diagnosis not present

## 2015-11-13 DIAGNOSIS — M6281 Muscle weakness (generalized): Secondary | ICD-10-CM | POA: Diagnosis not present

## 2015-11-13 DIAGNOSIS — Z9181 History of falling: Secondary | ICD-10-CM | POA: Diagnosis not present

## 2015-11-13 DIAGNOSIS — R262 Difficulty in walking, not elsewhere classified: Secondary | ICD-10-CM | POA: Diagnosis not present

## 2015-11-13 DIAGNOSIS — S22058D Other fracture of T5-T6 vertebra, subsequent encounter for fracture with routine healing: Secondary | ICD-10-CM | POA: Diagnosis not present

## 2015-11-14 DIAGNOSIS — S22058D Other fracture of T5-T6 vertebra, subsequent encounter for fracture with routine healing: Secondary | ICD-10-CM | POA: Diagnosis not present

## 2015-11-14 DIAGNOSIS — R262 Difficulty in walking, not elsewhere classified: Secondary | ICD-10-CM | POA: Diagnosis not present

## 2015-11-14 DIAGNOSIS — M6281 Muscle weakness (generalized): Secondary | ICD-10-CM | POA: Diagnosis not present

## 2015-11-14 DIAGNOSIS — S22059D Unspecified fracture of T5-T6 vertebra, subsequent encounter for fracture with routine healing: Secondary | ICD-10-CM | POA: Diagnosis not present

## 2015-11-14 DIAGNOSIS — Z9181 History of falling: Secondary | ICD-10-CM | POA: Diagnosis not present

## 2015-11-14 DIAGNOSIS — M79604 Pain in right leg: Secondary | ICD-10-CM | POA: Diagnosis not present

## 2015-11-17 DIAGNOSIS — M79604 Pain in right leg: Secondary | ICD-10-CM | POA: Diagnosis not present

## 2015-11-17 DIAGNOSIS — M6281 Muscle weakness (generalized): Secondary | ICD-10-CM | POA: Diagnosis not present

## 2015-11-17 DIAGNOSIS — Z9181 History of falling: Secondary | ICD-10-CM | POA: Diagnosis not present

## 2015-11-17 DIAGNOSIS — R262 Difficulty in walking, not elsewhere classified: Secondary | ICD-10-CM | POA: Diagnosis not present

## 2015-11-17 DIAGNOSIS — S22059D Unspecified fracture of T5-T6 vertebra, subsequent encounter for fracture with routine healing: Secondary | ICD-10-CM | POA: Diagnosis not present

## 2015-11-17 DIAGNOSIS — S22058D Other fracture of T5-T6 vertebra, subsequent encounter for fracture with routine healing: Secondary | ICD-10-CM | POA: Diagnosis not present

## 2015-11-18 DIAGNOSIS — S22058D Other fracture of T5-T6 vertebra, subsequent encounter for fracture with routine healing: Secondary | ICD-10-CM | POA: Diagnosis not present

## 2015-11-18 DIAGNOSIS — Z9181 History of falling: Secondary | ICD-10-CM | POA: Diagnosis not present

## 2015-11-18 DIAGNOSIS — S22059D Unspecified fracture of T5-T6 vertebra, subsequent encounter for fracture with routine healing: Secondary | ICD-10-CM | POA: Diagnosis not present

## 2015-11-18 DIAGNOSIS — R262 Difficulty in walking, not elsewhere classified: Secondary | ICD-10-CM | POA: Diagnosis not present

## 2015-11-18 DIAGNOSIS — M79604 Pain in right leg: Secondary | ICD-10-CM | POA: Diagnosis not present

## 2015-11-18 DIAGNOSIS — M6281 Muscle weakness (generalized): Secondary | ICD-10-CM | POA: Diagnosis not present

## 2015-11-19 DIAGNOSIS — M6281 Muscle weakness (generalized): Secondary | ICD-10-CM | POA: Diagnosis not present

## 2015-11-19 DIAGNOSIS — S22058D Other fracture of T5-T6 vertebra, subsequent encounter for fracture with routine healing: Secondary | ICD-10-CM | POA: Diagnosis not present

## 2015-11-19 DIAGNOSIS — M79604 Pain in right leg: Secondary | ICD-10-CM | POA: Diagnosis not present

## 2015-11-19 DIAGNOSIS — R262 Difficulty in walking, not elsewhere classified: Secondary | ICD-10-CM | POA: Diagnosis not present

## 2015-11-19 DIAGNOSIS — Z9181 History of falling: Secondary | ICD-10-CM | POA: Diagnosis not present

## 2015-11-19 DIAGNOSIS — S22059D Unspecified fracture of T5-T6 vertebra, subsequent encounter for fracture with routine healing: Secondary | ICD-10-CM | POA: Diagnosis not present

## 2015-11-20 DIAGNOSIS — S22058D Other fracture of T5-T6 vertebra, subsequent encounter for fracture with routine healing: Secondary | ICD-10-CM | POA: Diagnosis not present

## 2015-11-20 DIAGNOSIS — S22059D Unspecified fracture of T5-T6 vertebra, subsequent encounter for fracture with routine healing: Secondary | ICD-10-CM | POA: Diagnosis not present

## 2015-11-20 DIAGNOSIS — Z5181 Encounter for therapeutic drug level monitoring: Secondary | ICD-10-CM | POA: Diagnosis not present

## 2015-11-20 DIAGNOSIS — Z9181 History of falling: Secondary | ICD-10-CM | POA: Diagnosis not present

## 2015-11-20 DIAGNOSIS — M6281 Muscle weakness (generalized): Secondary | ICD-10-CM | POA: Diagnosis not present

## 2015-11-20 DIAGNOSIS — R262 Difficulty in walking, not elsewhere classified: Secondary | ICD-10-CM | POA: Diagnosis not present

## 2015-11-20 DIAGNOSIS — M79604 Pain in right leg: Secondary | ICD-10-CM | POA: Diagnosis not present

## 2015-11-21 DIAGNOSIS — S22058D Other fracture of T5-T6 vertebra, subsequent encounter for fracture with routine healing: Secondary | ICD-10-CM | POA: Diagnosis not present

## 2015-11-21 DIAGNOSIS — Z9181 History of falling: Secondary | ICD-10-CM | POA: Diagnosis not present

## 2015-11-21 DIAGNOSIS — M79604 Pain in right leg: Secondary | ICD-10-CM | POA: Diagnosis not present

## 2015-11-21 DIAGNOSIS — S22059D Unspecified fracture of T5-T6 vertebra, subsequent encounter for fracture with routine healing: Secondary | ICD-10-CM | POA: Diagnosis not present

## 2015-11-21 DIAGNOSIS — M6281 Muscle weakness (generalized): Secondary | ICD-10-CM | POA: Diagnosis not present

## 2015-11-21 DIAGNOSIS — R262 Difficulty in walking, not elsewhere classified: Secondary | ICD-10-CM | POA: Diagnosis not present

## 2015-11-24 DIAGNOSIS — M79604 Pain in right leg: Secondary | ICD-10-CM | POA: Diagnosis not present

## 2015-11-24 DIAGNOSIS — S22059D Unspecified fracture of T5-T6 vertebra, subsequent encounter for fracture with routine healing: Secondary | ICD-10-CM | POA: Diagnosis not present

## 2015-11-24 DIAGNOSIS — R262 Difficulty in walking, not elsewhere classified: Secondary | ICD-10-CM | POA: Diagnosis not present

## 2015-11-24 DIAGNOSIS — S22058D Other fracture of T5-T6 vertebra, subsequent encounter for fracture with routine healing: Secondary | ICD-10-CM | POA: Diagnosis not present

## 2015-11-24 DIAGNOSIS — Z9181 History of falling: Secondary | ICD-10-CM | POA: Diagnosis not present

## 2015-11-24 DIAGNOSIS — M6281 Muscle weakness (generalized): Secondary | ICD-10-CM | POA: Diagnosis not present

## 2015-11-25 DIAGNOSIS — S22059D Unspecified fracture of T5-T6 vertebra, subsequent encounter for fracture with routine healing: Secondary | ICD-10-CM | POA: Diagnosis not present

## 2015-11-25 DIAGNOSIS — R262 Difficulty in walking, not elsewhere classified: Secondary | ICD-10-CM | POA: Diagnosis not present

## 2015-11-25 DIAGNOSIS — M6281 Muscle weakness (generalized): Secondary | ICD-10-CM | POA: Diagnosis not present

## 2015-11-25 DIAGNOSIS — Z9181 History of falling: Secondary | ICD-10-CM | POA: Diagnosis not present

## 2015-11-25 DIAGNOSIS — M79604 Pain in right leg: Secondary | ICD-10-CM | POA: Diagnosis not present

## 2015-11-25 DIAGNOSIS — S22058D Other fracture of T5-T6 vertebra, subsequent encounter for fracture with routine healing: Secondary | ICD-10-CM | POA: Diagnosis not present

## 2015-11-26 DIAGNOSIS — S22058D Other fracture of T5-T6 vertebra, subsequent encounter for fracture with routine healing: Secondary | ICD-10-CM | POA: Diagnosis not present

## 2015-11-26 DIAGNOSIS — S22059D Unspecified fracture of T5-T6 vertebra, subsequent encounter for fracture with routine healing: Secondary | ICD-10-CM | POA: Diagnosis not present

## 2015-11-26 DIAGNOSIS — M79604 Pain in right leg: Secondary | ICD-10-CM | POA: Diagnosis not present

## 2015-11-26 DIAGNOSIS — Z9181 History of falling: Secondary | ICD-10-CM | POA: Diagnosis not present

## 2015-11-26 DIAGNOSIS — R262 Difficulty in walking, not elsewhere classified: Secondary | ICD-10-CM | POA: Diagnosis not present

## 2015-11-26 DIAGNOSIS — M6281 Muscle weakness (generalized): Secondary | ICD-10-CM | POA: Diagnosis not present

## 2015-11-27 DIAGNOSIS — S22058D Other fracture of T5-T6 vertebra, subsequent encounter for fracture with routine healing: Secondary | ICD-10-CM | POA: Diagnosis not present

## 2015-11-27 DIAGNOSIS — M6281 Muscle weakness (generalized): Secondary | ICD-10-CM | POA: Diagnosis not present

## 2015-11-27 DIAGNOSIS — R262 Difficulty in walking, not elsewhere classified: Secondary | ICD-10-CM | POA: Diagnosis not present

## 2015-11-27 DIAGNOSIS — M79604 Pain in right leg: Secondary | ICD-10-CM | POA: Diagnosis not present

## 2015-11-27 DIAGNOSIS — Z9181 History of falling: Secondary | ICD-10-CM | POA: Diagnosis not present

## 2015-11-27 DIAGNOSIS — S22059D Unspecified fracture of T5-T6 vertebra, subsequent encounter for fracture with routine healing: Secondary | ICD-10-CM | POA: Diagnosis not present

## 2015-11-28 DIAGNOSIS — S22058D Other fracture of T5-T6 vertebra, subsequent encounter for fracture with routine healing: Secondary | ICD-10-CM | POA: Diagnosis not present

## 2015-11-28 DIAGNOSIS — M79604 Pain in right leg: Secondary | ICD-10-CM | POA: Diagnosis not present

## 2015-11-28 DIAGNOSIS — S22059D Unspecified fracture of T5-T6 vertebra, subsequent encounter for fracture with routine healing: Secondary | ICD-10-CM | POA: Diagnosis not present

## 2015-11-28 DIAGNOSIS — M6281 Muscle weakness (generalized): Secondary | ICD-10-CM | POA: Diagnosis not present

## 2015-11-28 DIAGNOSIS — R262 Difficulty in walking, not elsewhere classified: Secondary | ICD-10-CM | POA: Diagnosis not present

## 2015-11-28 DIAGNOSIS — Z9181 History of falling: Secondary | ICD-10-CM | POA: Diagnosis not present

## 2015-12-01 DIAGNOSIS — M6281 Muscle weakness (generalized): Secondary | ICD-10-CM | POA: Diagnosis not present

## 2015-12-01 DIAGNOSIS — S22058D Other fracture of T5-T6 vertebra, subsequent encounter for fracture with routine healing: Secondary | ICD-10-CM | POA: Diagnosis not present

## 2015-12-01 DIAGNOSIS — M79604 Pain in right leg: Secondary | ICD-10-CM | POA: Diagnosis not present

## 2015-12-01 DIAGNOSIS — Z9181 History of falling: Secondary | ICD-10-CM | POA: Diagnosis not present

## 2015-12-01 DIAGNOSIS — S22059D Unspecified fracture of T5-T6 vertebra, subsequent encounter for fracture with routine healing: Secondary | ICD-10-CM | POA: Diagnosis not present

## 2015-12-01 DIAGNOSIS — R262 Difficulty in walking, not elsewhere classified: Secondary | ICD-10-CM | POA: Diagnosis not present

## 2015-12-04 DIAGNOSIS — M79674 Pain in right toe(s): Secondary | ICD-10-CM | POA: Diagnosis not present

## 2015-12-04 DIAGNOSIS — B351 Tinea unguium: Secondary | ICD-10-CM | POA: Diagnosis not present

## 2015-12-04 DIAGNOSIS — E119 Type 2 diabetes mellitus without complications: Secondary | ICD-10-CM | POA: Diagnosis not present

## 2015-12-04 DIAGNOSIS — M79675 Pain in left toe(s): Secondary | ICD-10-CM | POA: Diagnosis not present

## 2015-12-12 DIAGNOSIS — S82841G Displaced bimalleolar fracture of right lower leg, subsequent encounter for closed fracture with delayed healing: Secondary | ICD-10-CM | POA: Diagnosis not present

## 2015-12-12 DIAGNOSIS — S82841D Displaced bimalleolar fracture of right lower leg, subsequent encounter for closed fracture with routine healing: Secondary | ICD-10-CM | POA: Diagnosis not present

## 2016-01-01 DIAGNOSIS — S22058D Other fracture of T5-T6 vertebra, subsequent encounter for fracture with routine healing: Secondary | ICD-10-CM | POA: Diagnosis not present

## 2016-01-01 DIAGNOSIS — M79604 Pain in right leg: Secondary | ICD-10-CM | POA: Diagnosis not present

## 2016-01-01 DIAGNOSIS — R262 Difficulty in walking, not elsewhere classified: Secondary | ICD-10-CM | POA: Diagnosis not present

## 2016-01-01 DIAGNOSIS — S22059D Unspecified fracture of T5-T6 vertebra, subsequent encounter for fracture with routine healing: Secondary | ICD-10-CM | POA: Diagnosis not present

## 2016-01-01 DIAGNOSIS — M6281 Muscle weakness (generalized): Secondary | ICD-10-CM | POA: Diagnosis not present

## 2016-01-01 DIAGNOSIS — Z9181 History of falling: Secondary | ICD-10-CM | POA: Diagnosis not present

## 2016-01-02 DIAGNOSIS — M79604 Pain in right leg: Secondary | ICD-10-CM | POA: Diagnosis not present

## 2016-01-02 DIAGNOSIS — S82841G Displaced bimalleolar fracture of right lower leg, subsequent encounter for closed fracture with delayed healing: Secondary | ICD-10-CM | POA: Diagnosis not present

## 2016-01-02 DIAGNOSIS — M6281 Muscle weakness (generalized): Secondary | ICD-10-CM | POA: Diagnosis not present

## 2016-01-02 DIAGNOSIS — R262 Difficulty in walking, not elsewhere classified: Secondary | ICD-10-CM | POA: Diagnosis not present

## 2016-01-02 DIAGNOSIS — S22059D Unspecified fracture of T5-T6 vertebra, subsequent encounter for fracture with routine healing: Secondary | ICD-10-CM | POA: Diagnosis not present

## 2016-01-02 DIAGNOSIS — Z9181 History of falling: Secondary | ICD-10-CM | POA: Diagnosis not present

## 2016-01-02 DIAGNOSIS — S22058D Other fracture of T5-T6 vertebra, subsequent encounter for fracture with routine healing: Secondary | ICD-10-CM | POA: Diagnosis not present

## 2016-01-05 DIAGNOSIS — R262 Difficulty in walking, not elsewhere classified: Secondary | ICD-10-CM | POA: Diagnosis not present

## 2016-01-05 DIAGNOSIS — S22058D Other fracture of T5-T6 vertebra, subsequent encounter for fracture with routine healing: Secondary | ICD-10-CM | POA: Diagnosis not present

## 2016-01-05 DIAGNOSIS — Z9181 History of falling: Secondary | ICD-10-CM | POA: Diagnosis not present

## 2016-01-05 DIAGNOSIS — S22059D Unspecified fracture of T5-T6 vertebra, subsequent encounter for fracture with routine healing: Secondary | ICD-10-CM | POA: Diagnosis not present

## 2016-01-05 DIAGNOSIS — M6281 Muscle weakness (generalized): Secondary | ICD-10-CM | POA: Diagnosis not present

## 2016-01-05 DIAGNOSIS — M79604 Pain in right leg: Secondary | ICD-10-CM | POA: Diagnosis not present

## 2016-01-06 DIAGNOSIS — S22058D Other fracture of T5-T6 vertebra, subsequent encounter for fracture with routine healing: Secondary | ICD-10-CM | POA: Diagnosis not present

## 2016-01-06 DIAGNOSIS — M79604 Pain in right leg: Secondary | ICD-10-CM | POA: Diagnosis not present

## 2016-01-06 DIAGNOSIS — S22059D Unspecified fracture of T5-T6 vertebra, subsequent encounter for fracture with routine healing: Secondary | ICD-10-CM | POA: Diagnosis not present

## 2016-01-06 DIAGNOSIS — R262 Difficulty in walking, not elsewhere classified: Secondary | ICD-10-CM | POA: Diagnosis not present

## 2016-01-06 DIAGNOSIS — M6281 Muscle weakness (generalized): Secondary | ICD-10-CM | POA: Diagnosis not present

## 2016-01-06 DIAGNOSIS — Z9181 History of falling: Secondary | ICD-10-CM | POA: Diagnosis not present

## 2016-01-07 DIAGNOSIS — M79604 Pain in right leg: Secondary | ICD-10-CM | POA: Diagnosis not present

## 2016-01-07 DIAGNOSIS — S22058D Other fracture of T5-T6 vertebra, subsequent encounter for fracture with routine healing: Secondary | ICD-10-CM | POA: Diagnosis not present

## 2016-01-07 DIAGNOSIS — S22059D Unspecified fracture of T5-T6 vertebra, subsequent encounter for fracture with routine healing: Secondary | ICD-10-CM | POA: Diagnosis not present

## 2016-01-07 DIAGNOSIS — M6281 Muscle weakness (generalized): Secondary | ICD-10-CM | POA: Diagnosis not present

## 2016-01-07 DIAGNOSIS — Z9181 History of falling: Secondary | ICD-10-CM | POA: Diagnosis not present

## 2016-01-07 DIAGNOSIS — R262 Difficulty in walking, not elsewhere classified: Secondary | ICD-10-CM | POA: Diagnosis not present

## 2016-01-08 DIAGNOSIS — S22059D Unspecified fracture of T5-T6 vertebra, subsequent encounter for fracture with routine healing: Secondary | ICD-10-CM | POA: Diagnosis not present

## 2016-01-08 DIAGNOSIS — M79604 Pain in right leg: Secondary | ICD-10-CM | POA: Diagnosis not present

## 2016-01-08 DIAGNOSIS — Z9181 History of falling: Secondary | ICD-10-CM | POA: Diagnosis not present

## 2016-01-08 DIAGNOSIS — S22058D Other fracture of T5-T6 vertebra, subsequent encounter for fracture with routine healing: Secondary | ICD-10-CM | POA: Diagnosis not present

## 2016-01-08 DIAGNOSIS — M6281 Muscle weakness (generalized): Secondary | ICD-10-CM | POA: Diagnosis not present

## 2016-01-08 DIAGNOSIS — R262 Difficulty in walking, not elsewhere classified: Secondary | ICD-10-CM | POA: Diagnosis not present

## 2016-01-09 DIAGNOSIS — S22058D Other fracture of T5-T6 vertebra, subsequent encounter for fracture with routine healing: Secondary | ICD-10-CM | POA: Diagnosis not present

## 2016-01-09 DIAGNOSIS — M79604 Pain in right leg: Secondary | ICD-10-CM | POA: Diagnosis not present

## 2016-01-09 DIAGNOSIS — M6281 Muscle weakness (generalized): Secondary | ICD-10-CM | POA: Diagnosis not present

## 2016-01-09 DIAGNOSIS — S22059D Unspecified fracture of T5-T6 vertebra, subsequent encounter for fracture with routine healing: Secondary | ICD-10-CM | POA: Diagnosis not present

## 2016-01-09 DIAGNOSIS — R262 Difficulty in walking, not elsewhere classified: Secondary | ICD-10-CM | POA: Diagnosis not present

## 2016-01-09 DIAGNOSIS — Z9181 History of falling: Secondary | ICD-10-CM | POA: Diagnosis not present

## 2016-01-12 DIAGNOSIS — Z9181 History of falling: Secondary | ICD-10-CM | POA: Diagnosis not present

## 2016-01-12 DIAGNOSIS — S22058D Other fracture of T5-T6 vertebra, subsequent encounter for fracture with routine healing: Secondary | ICD-10-CM | POA: Diagnosis not present

## 2016-01-12 DIAGNOSIS — R262 Difficulty in walking, not elsewhere classified: Secondary | ICD-10-CM | POA: Diagnosis not present

## 2016-01-12 DIAGNOSIS — M79604 Pain in right leg: Secondary | ICD-10-CM | POA: Diagnosis not present

## 2016-01-12 DIAGNOSIS — M6281 Muscle weakness (generalized): Secondary | ICD-10-CM | POA: Diagnosis not present

## 2016-01-12 DIAGNOSIS — S22059D Unspecified fracture of T5-T6 vertebra, subsequent encounter for fracture with routine healing: Secondary | ICD-10-CM | POA: Diagnosis not present

## 2016-01-13 DIAGNOSIS — S22059D Unspecified fracture of T5-T6 vertebra, subsequent encounter for fracture with routine healing: Secondary | ICD-10-CM | POA: Diagnosis not present

## 2016-01-13 DIAGNOSIS — R262 Difficulty in walking, not elsewhere classified: Secondary | ICD-10-CM | POA: Diagnosis not present

## 2016-01-13 DIAGNOSIS — M6281 Muscle weakness (generalized): Secondary | ICD-10-CM | POA: Diagnosis not present

## 2016-01-13 DIAGNOSIS — M79604 Pain in right leg: Secondary | ICD-10-CM | POA: Diagnosis not present

## 2016-01-13 DIAGNOSIS — Z9181 History of falling: Secondary | ICD-10-CM | POA: Diagnosis not present

## 2016-01-13 DIAGNOSIS — S22058D Other fracture of T5-T6 vertebra, subsequent encounter for fracture with routine healing: Secondary | ICD-10-CM | POA: Diagnosis not present

## 2016-01-13 DIAGNOSIS — M79661 Pain in right lower leg: Secondary | ICD-10-CM | POA: Diagnosis not present

## 2016-01-15 DIAGNOSIS — Z9181 History of falling: Secondary | ICD-10-CM | POA: Diagnosis not present

## 2016-01-15 DIAGNOSIS — S22059D Unspecified fracture of T5-T6 vertebra, subsequent encounter for fracture with routine healing: Secondary | ICD-10-CM | POA: Diagnosis not present

## 2016-01-15 DIAGNOSIS — M79604 Pain in right leg: Secondary | ICD-10-CM | POA: Diagnosis not present

## 2016-01-15 DIAGNOSIS — R262 Difficulty in walking, not elsewhere classified: Secondary | ICD-10-CM | POA: Diagnosis not present

## 2016-01-15 DIAGNOSIS — M25571 Pain in right ankle and joints of right foot: Secondary | ICD-10-CM | POA: Diagnosis not present

## 2016-01-15 DIAGNOSIS — M6281 Muscle weakness (generalized): Secondary | ICD-10-CM | POA: Diagnosis not present

## 2016-01-15 DIAGNOSIS — S22058D Other fracture of T5-T6 vertebra, subsequent encounter for fracture with routine healing: Secondary | ICD-10-CM | POA: Diagnosis not present

## 2016-01-20 ENCOUNTER — Emergency Department
Admission: EM | Admit: 2016-01-20 | Discharge: 2016-01-20 | Disposition: A | Discharge status: Skilled Nursing Facility | Payer: Medicare Other | Attending: Emergency Medicine | Admitting: Emergency Medicine

## 2016-01-20 ENCOUNTER — Emergency Department: Payer: Medicare Other

## 2016-01-20 ENCOUNTER — Encounter: Payer: Self-pay | Admitting: Intensive Care

## 2016-01-20 DIAGNOSIS — M79661 Pain in right lower leg: Secondary | ICD-10-CM | POA: Diagnosis present

## 2016-01-20 DIAGNOSIS — E1129 Type 2 diabetes mellitus with other diabetic kidney complication: Secondary | ICD-10-CM | POA: Insufficient documentation

## 2016-01-20 DIAGNOSIS — M7989 Other specified soft tissue disorders: Secondary | ICD-10-CM | POA: Diagnosis not present

## 2016-01-20 DIAGNOSIS — S82841G Displaced bimalleolar fracture of right lower leg, subsequent encounter for closed fracture with delayed healing: Secondary | ICD-10-CM | POA: Diagnosis not present

## 2016-01-20 DIAGNOSIS — E785 Hyperlipidemia, unspecified: Secondary | ICD-10-CM | POA: Insufficient documentation

## 2016-01-20 DIAGNOSIS — X58XXXD Exposure to other specified factors, subsequent encounter: Secondary | ICD-10-CM | POA: Diagnosis not present

## 2016-01-20 DIAGNOSIS — N189 Chronic kidney disease, unspecified: Secondary | ICD-10-CM | POA: Insufficient documentation

## 2016-01-20 DIAGNOSIS — M1990 Unspecified osteoarthritis, unspecified site: Secondary | ICD-10-CM | POA: Diagnosis not present

## 2016-01-20 DIAGNOSIS — Z79899 Other long term (current) drug therapy: Secondary | ICD-10-CM | POA: Diagnosis not present

## 2016-01-20 DIAGNOSIS — S99911A Unspecified injury of right ankle, initial encounter: Secondary | ICD-10-CM | POA: Diagnosis not present

## 2016-01-20 DIAGNOSIS — M79604 Pain in right leg: Secondary | ICD-10-CM | POA: Diagnosis not present

## 2016-01-20 DIAGNOSIS — M25571 Pain in right ankle and joints of right foot: Secondary | ICD-10-CM | POA: Diagnosis not present

## 2016-01-20 DIAGNOSIS — I129 Hypertensive chronic kidney disease with stage 1 through stage 4 chronic kidney disease, or unspecified chronic kidney disease: Secondary | ICD-10-CM | POA: Diagnosis not present

## 2016-01-20 DIAGNOSIS — Z743 Need for continuous supervision: Secondary | ICD-10-CM | POA: Diagnosis not present

## 2016-01-20 HISTORY — DX: Hyperlipidemia, unspecified: E78.5

## 2016-01-20 HISTORY — DX: Polyosteoarthritis, unspecified: M15.9

## 2016-01-20 HISTORY — DX: Muscle weakness (generalized): M62.81

## 2016-01-20 HISTORY — DX: Chronic kidney disease, unspecified: N18.9

## 2016-01-20 HISTORY — DX: Low back pain, unspecified: M54.50

## 2016-01-20 HISTORY — DX: Type 2 diabetes mellitus without complications: E11.9

## 2016-01-20 HISTORY — DX: Unspecified fracture of t5-T6 vertebra, subsequent encounter for fracture with routine healing: S22.059D

## 2016-01-20 HISTORY — DX: Low back pain: M54.5

## 2016-01-20 HISTORY — DX: Other fracture of t5-T6 vertebra, subsequent encounter for fracture with routine healing: S22.058D

## 2016-01-20 LAB — GLUCOSE, CAPILLARY: GLUCOSE-CAPILLARY: 292 mg/dL — AB (ref 65–99)

## 2016-01-20 NOTE — ED Provider Notes (Signed)
[facility] Emergency Department Provider Note  ____________________________________________  Time seen: 2:30 PM  I have reviewed the triage vital signs and the nursing notes.   HISTORY  Chief Complaint No chief complaint on file.    HPI Daniel [name] is a 82 y.o. male who comes to the ED complaining of increased right leg pain and swelling over the past 2-3 days. He has a history of a distal fibula and distal tibia fracture, bimalleolar ankle fracture on the right leg that is been managed by orthopedics with a walking boot. They recently saw him in clinic on 01/02/2016. He's been working with PT and weightbearing as tolerated with the boot. He's had delayed healing but it is continuing to improve. Patient denies chest pain shortness of breath fevers chills. No new injuries. No other complaints.     Past Medical History  Diagnosis Date  . Arthritis   . Dementia   . Renal disorder   . Hypertension   . Unspecified fracture of t5-T6 vertebra, subsequent encounter for fracture with routine healing   . Other fracture of t5-T6 vertebra, subsequent encounter for fracture with routine healing   . Polyosteoarthritis   . Hyperlipidemia   . Diabetes mellitus without complication (HCC)     Type 2  . Type 2 diabetes mellitus ([place])   . Muscle weakness (generalized)   . Chronic kidney disease   . Low back pain      Patient Active Problem List   Diagnosis Date Noted  . Tibia/fibula fracture 09/26/2015     Past Surgical History  Procedure Laterality Date  . Cholecystectomy    . Back surgery    . Knee surgery    . Hernia repair    . Teeth pulled  Bilateral 08/13/14    teeth pulled and bottom gum removed     Current Outpatient Rx  Name  Route  Sig  Dispense  Refill  . amLODipine (NORVASC) 10 MG tablet   Oral   Take 10 mg by mouth daily.         [name] atorvastatin (LIPITOR) 10 MG tablet   Oral   Take 5 mg by mouth daily.         . bumetanide  (BUMEX) 0.5 MG tablet   Oral   Take 0.5 mg by mouth daily.         . Hypromellose (NATURAL BALANCE TEARS) 0.4 % SOLN   Ophthalmic   Apply 1 drop to eye every 6 (six) hours as needed (for dry eyes).         [name] ibuprofen (ADVIL,MOTRIN) 400 MG tablet   Oral   Take 1 tablet (400 mg total) by mouth every 6 (six) hours as needed for headache or moderate pain.   30 tablet   0   . Insulin Glargine (TOUJEO SOLOSTAR) 300 UNIT/ML SOPN   Subcutaneous   Inject 45 Units into the skin daily.   5 pen   0   . losartan (COZAAR) 25 MG tablet   Oral   Take 25 mg by mouth daily.         . memantine (NAMENDA XR) 28 MG CP24 24 hr capsule   Oral   Take 28 mg by mouth daily.         . sitaGLIPtin (JANUVIA) 50 MG tablet   Oral   Take 50 mg by mouth daily. Reported on 07/30/2015         . traMADol (ULTRAM) 50 MG tablet  Oral   Take 1 tablet (50 mg total) by mouth every 6 (six) hours.   120 tablet   0      Allergies Oxycodone and Codeine   Family History  Problem Relation Age of Onset  . Hypertension Father     Social History Social History  Substance Use Topics  . Smoking status: Never Smoker   . Smokeless tobacco: None  . Alcohol Use: No    Review of Systems  Constitutional:   No fever or chills.  Eyes:   No vision changes.  ENT:   No sore throat. No rhinorrhea. Cardiovascular:   No chest pain. Respiratory:   No dyspnea or cough. Gastrointestinal:   Negative for abdominal pain, vomiting and diarrhea.  Genitourinary:   Negative for dysuria or difficulty urinating. Musculoskeletal:   Right leg pain and swelling Neurological:   Negative for headaches 10-point ROS otherwise negative.  ____________________________________________   PHYSICAL EXAM:  VITAL SIGNS: ED Triage Vitals  Enc Vitals Group     BP 01/20/16 1419 152/71 mmHg     Pulse Rate 01/20/16 1419 92     Resp 01/20/16 1419 14     Temp 01/20/16 1419 98.2 F (36.8 C)     Temp Source 01/20/16 1419  Oral     SpO2 01/20/16 1419 96 %     Weight 01/20/16 1419 239 lb (108.41 kg)     Height 01/20/16 1419 5\' 10"  (1.778 m)     Head Cir --      Peak Flow --      Pain Score 01/20/16 1420 3     Pain Loc --      Pain Edu? --      Excl. in [place]? --     Vital signs reviewed, nursing assessments reviewed.   Constitutional:   Alert and oriented. Well appearing and in no distress. Eyes:   No scleral icterus. No conjunctival pallor. PERRL. EOMI.  No nystagmus. ENT   Head:   Normocephalic and atraumatic.   Nose:   No congestion/rhinnorhea. No septal hematoma   Mouth/Throat:   MMM, no pharyngeal erythema. No peritonsillar mass.    Neck:   No stridor. No SubQ emphysema. No meningismus. Hematological/Lymphatic/Immunilogical:   No cervical lymphadenopathy. Cardiovascular:   RRR. Symmetric bilateral radial and DP pulses.  No murmurs.  Respiratory:   Normal respiratory effort without tachypnea nor retractions. Breath sounds are clear and equal bilaterally. No wheezes/rales/rhonchi. Gastrointestinal:   Soft and nontender. Non distended. There is no CVA tenderness.  No rebound, rigidity, or guarding. Genitourinary:   deferred Musculoskeletal:   There is 1+ pitting edema in the right lower extremity below the knee. Only trace edema in the left lower extremity. The calf circumference of the right leg is increased. There is some generalized mild tenderness, but no focal bony tenderness. Intact distal perfusion with DP pulse and capillary refill. No induration warmth soft tissue tenderness crepitus or drainage. Neurologic:   Normal speech and language.  CN 2-10 normal. Motor grossly intact. No gross focal neurologic deficits are appreciated.  Skin:    Skin is warm, dry and intact. No rash noted.  No petechiae, purpura, or bullae.  ____________________________________________    LABS (pertinent positives/negatives) (all labs ordered are listed, but only abnormal results are displayed) Labs  Reviewed  GLUCOSE, CAPILLARY - Abnormal; Notable for the following:    Glucose-Capillary 292 (*)    All other components within normal limits   ____________________________________________   EKG  ____________________________________________    RADIOLOGY  X-ray right tib-fib demonstrates old malleoli fracture without new injury.  X-ray right ankle unremarkable Ultrasound venous right lower extremity unremarkable, negative for DVT  ____________________________________________   PROCEDURES   ____________________________________________   INITIAL IMPRESSION / ASSESSMENT AND PLAN / ED COURSE  Pertinent labs & imaging results that were available during my care of the patient were reviewed by me and considered in my medical decision making (see chart for details).  Patient well appearing no acute distress, vital signs unremarkable. Complains of right leg pain and swelling. No evidence of soft tissue infection such as cellulitis abscess necrotizing fasciitis or osteomyelitis. I don't see any evidence of a new injury or fracture. Ultrasound is negative for DVT, x-rays unremarkable. Orthopedic clinic note reviewed. Continue with their usual management for now, follow up with orthopedics and primary care.     ____________________________________________   FINAL CLINICAL IMPRESSION(S) / ED DIAGNOSES  Final diagnoses:  Bimalleolar ankle fracture, right, closed, with delayed healing, subsequent encounter  Right leg swelling       Portions of this note were generated with dragon dictation software. Dictation errors may occur despite best attempts at proofreading.   Carrie Mew, MD 01/20/16 1710

## 2016-01-20 NOTE — Discharge Instructions (Signed)
Your x-rays today showed continued healing of your ankle fracture. No new injuries noted. Your ultrasound today does not show any evidence of a blood clot in the right leg. Continue with the walking boot, weightbearing as tolerated, continue with physical therapy, continue following up with primary care and orthopedics.  Edema Edema is an abnormal buildup of fluids in your bodytissues. Edema is somewhatdependent on gravity to pull the fluid to the lowest place in your body. That makes the condition more common in the legs and thighs (lower extremities). Painless swelling of the feet and ankles is common and becomes more likely as you get older. It is also common in looser tissues, like around your eyes.  When the affected area is squeezed, the fluid may move out of that spot and leave a dent for a few moments. This dent is called pitting.  CAUSES  There are many possible causes of edema. Eating too much salt and being on your feet or sitting for a long time can cause edema in your legs and ankles. Hot weather may make edema worse. Common medical causes of edema include:  Heart failure.  Liver disease.  Kidney disease.  Weak blood vessels in your legs.  Cancer.  An injury.  Pregnancy.  Some medications.  Obesity. SYMPTOMS  Edema is usually painless.Your skin may look swollen or shiny.  DIAGNOSIS  Your health care provider may be able to diagnose edema by asking about your medical history and doing a physical exam. You may need to have tests such as X-rays, an electrocardiogram, or blood tests to check for medical conditions that may cause edema.  TREATMENT  Edema treatment depends on the cause. If you have heart, liver, or kidney disease, you need the treatment appropriate for these conditions. General treatment may include:  Elevation of the affected body part above the level of your heart.  Compression of the affected body part. Pressure from elastic bandages or support  stockings squeezes the tissues and forces fluid back into the blood vessels. This keeps fluid from entering the tissues.  Restriction of fluid and salt intake.  Use of a water pill (diuretic). These medications are appropriate only for some types of edema. They pull fluid out of your body and make you urinate more often. This gets rid of fluid and reduces swelling, but diuretics can have side effects. Only use diuretics as directed by your health care provider. HOME CARE INSTRUCTIONS   Keep the affected body part above the level of your heart when you are lying down.   Do not sit still or stand for prolonged periods.   Do not put anything directly under your knees when lying down.  Do not wear constricting clothing or garters on your upper legs.   Exercise your legs to work the fluid back into your blood vessels. This may help the swelling go down.   Wear elastic bandages or support stockings to reduce ankle swelling as directed by your health care provider.   Eat a low-salt diet to reduce fluid if your health care provider recommends it.   Only take medicines as directed by your health care provider. SEEK MEDICAL CARE IF:   Your edema is not responding to treatment.  You have heart, liver, or kidney disease and notice symptoms of edema.  You have edema in your legs that does not improve after elevating them.   You have sudden and unexplained weight gain. SEEK IMMEDIATE MEDICAL CARE IF:   You develop shortness of  breath or chest pain.   You cannot breathe when you lie down.  You develop pain, redness, or warmth in the swollen areas.   You have heart, liver, or kidney disease and suddenly get edema.  You have a fever and your symptoms suddenly get worse. MAKE SURE YOU:   Understand these instructions.  Will watch your condition.  Will get help right away if you are not doing well or get worse.   This information is not intended to replace advice given to you  by your health care provider. Make sure you discuss any questions you have with your health care provider.   Document Released: 07/19/2005 Document Revised: 08/09/2014 Document Reviewed: 05/11/2013 Elsevier Interactive Patient Education [employer].

## 2016-01-20 NOTE — ED Notes (Signed)
Report called to [employer]

## 2016-01-20 NOTE — ED Notes (Signed)
Pt arrived by EMS from [facility]. PT fell back in February and fractured his R leg. Recently had Xrays done that showed he still had fractures on his tibia according to staff. Pt has been wheelchair bound since fall in February. Pt is A&O x4 PT presents to ER with swelling to R lower leg/foot.

## 2016-01-20 NOTE — ED Notes (Signed)
XR at bedside

## 2016-01-30 DIAGNOSIS — M1711 Unilateral primary osteoarthritis, right knee: Secondary | ICD-10-CM | POA: Diagnosis not present

## 2016-01-30 DIAGNOSIS — S82841G Displaced bimalleolar fracture of right lower leg, subsequent encounter for closed fracture with delayed healing: Secondary | ICD-10-CM | POA: Diagnosis not present

## 2016-01-30 DIAGNOSIS — M25561 Pain in right knee: Secondary | ICD-10-CM | POA: Diagnosis not present

## 2016-02-11 DIAGNOSIS — S82401D Unspecified fracture of shaft of right fibula, subsequent encounter for closed fracture with routine healing: Secondary | ICD-10-CM | POA: Diagnosis not present

## 2016-02-11 DIAGNOSIS — Z9181 History of falling: Secondary | ICD-10-CM | POA: Diagnosis not present

## 2016-02-11 DIAGNOSIS — M79604 Pain in right leg: Secondary | ICD-10-CM | POA: Diagnosis not present

## 2016-02-11 DIAGNOSIS — R262 Difficulty in walking, not elsewhere classified: Secondary | ICD-10-CM | POA: Diagnosis not present

## 2016-02-11 DIAGNOSIS — S22059D Unspecified fracture of T5-T6 vertebra, subsequent encounter for fracture with routine healing: Secondary | ICD-10-CM | POA: Diagnosis not present

## 2016-02-11 DIAGNOSIS — S22058D Other fracture of T5-T6 vertebra, subsequent encounter for fracture with routine healing: Secondary | ICD-10-CM | POA: Diagnosis not present

## 2016-02-11 DIAGNOSIS — M6281 Muscle weakness (generalized): Secondary | ICD-10-CM | POA: Diagnosis not present

## 2016-02-12 DIAGNOSIS — M6281 Muscle weakness (generalized): Secondary | ICD-10-CM | POA: Diagnosis not present

## 2016-02-12 DIAGNOSIS — S22059D Unspecified fracture of T5-T6 vertebra, subsequent encounter for fracture with routine healing: Secondary | ICD-10-CM | POA: Diagnosis not present

## 2016-02-12 DIAGNOSIS — S82401D Unspecified fracture of shaft of right fibula, subsequent encounter for closed fracture with routine healing: Secondary | ICD-10-CM | POA: Diagnosis not present

## 2016-02-12 DIAGNOSIS — S22058D Other fracture of T5-T6 vertebra, subsequent encounter for fracture with routine healing: Secondary | ICD-10-CM | POA: Diagnosis not present

## 2016-02-12 DIAGNOSIS — M79604 Pain in right leg: Secondary | ICD-10-CM | POA: Diagnosis not present

## 2016-02-12 DIAGNOSIS — R262 Difficulty in walking, not elsewhere classified: Secondary | ICD-10-CM | POA: Diagnosis not present

## 2016-02-13 DIAGNOSIS — N182 Chronic kidney disease, stage 2 (mild): Secondary | ICD-10-CM | POA: Diagnosis not present

## 2016-02-13 DIAGNOSIS — I129 Hypertensive chronic kidney disease with stage 1 through stage 4 chronic kidney disease, or unspecified chronic kidney disease: Secondary | ICD-10-CM | POA: Diagnosis not present

## 2016-02-13 DIAGNOSIS — M545 Low back pain: Secondary | ICD-10-CM | POA: Diagnosis not present

## 2016-02-13 DIAGNOSIS — E119 Type 2 diabetes mellitus without complications: Secondary | ICD-10-CM | POA: Diagnosis not present

## 2016-02-16 DIAGNOSIS — S22059D Unspecified fracture of T5-T6 vertebra, subsequent encounter for fracture with routine healing: Secondary | ICD-10-CM | POA: Diagnosis not present

## 2016-02-16 DIAGNOSIS — R262 Difficulty in walking, not elsewhere classified: Secondary | ICD-10-CM | POA: Diagnosis not present

## 2016-02-16 DIAGNOSIS — S82401D Unspecified fracture of shaft of right fibula, subsequent encounter for closed fracture with routine healing: Secondary | ICD-10-CM | POA: Diagnosis not present

## 2016-02-16 DIAGNOSIS — S22058D Other fracture of T5-T6 vertebra, subsequent encounter for fracture with routine healing: Secondary | ICD-10-CM | POA: Diagnosis not present

## 2016-02-16 DIAGNOSIS — M6281 Muscle weakness (generalized): Secondary | ICD-10-CM | POA: Diagnosis not present

## 2016-02-16 DIAGNOSIS — M79604 Pain in right leg: Secondary | ICD-10-CM | POA: Diagnosis not present

## 2016-02-18 DIAGNOSIS — S82401D Unspecified fracture of shaft of right fibula, subsequent encounter for closed fracture with routine healing: Secondary | ICD-10-CM | POA: Diagnosis not present

## 2016-02-18 DIAGNOSIS — S22058D Other fracture of T5-T6 vertebra, subsequent encounter for fracture with routine healing: Secondary | ICD-10-CM | POA: Diagnosis not present

## 2016-02-18 DIAGNOSIS — M6281 Muscle weakness (generalized): Secondary | ICD-10-CM | POA: Diagnosis not present

## 2016-02-18 DIAGNOSIS — S22059D Unspecified fracture of T5-T6 vertebra, subsequent encounter for fracture with routine healing: Secondary | ICD-10-CM | POA: Diagnosis not present

## 2016-02-18 DIAGNOSIS — M79604 Pain in right leg: Secondary | ICD-10-CM | POA: Diagnosis not present

## 2016-02-18 DIAGNOSIS — R262 Difficulty in walking, not elsewhere classified: Secondary | ICD-10-CM | POA: Diagnosis not present

## 2016-02-19 DIAGNOSIS — S22058D Other fracture of T5-T6 vertebra, subsequent encounter for fracture with routine healing: Secondary | ICD-10-CM | POA: Diagnosis not present

## 2016-02-19 DIAGNOSIS — M6281 Muscle weakness (generalized): Secondary | ICD-10-CM | POA: Diagnosis not present

## 2016-02-19 DIAGNOSIS — M79604 Pain in right leg: Secondary | ICD-10-CM | POA: Diagnosis not present

## 2016-02-19 DIAGNOSIS — S22059D Unspecified fracture of T5-T6 vertebra, subsequent encounter for fracture with routine healing: Secondary | ICD-10-CM | POA: Diagnosis not present

## 2016-02-19 DIAGNOSIS — R262 Difficulty in walking, not elsewhere classified: Secondary | ICD-10-CM | POA: Diagnosis not present

## 2016-02-19 DIAGNOSIS — S82401D Unspecified fracture of shaft of right fibula, subsequent encounter for closed fracture with routine healing: Secondary | ICD-10-CM | POA: Diagnosis not present

## 2016-02-24 DIAGNOSIS — R262 Difficulty in walking, not elsewhere classified: Secondary | ICD-10-CM | POA: Diagnosis not present

## 2016-02-24 DIAGNOSIS — M6281 Muscle weakness (generalized): Secondary | ICD-10-CM | POA: Diagnosis not present

## 2016-02-24 DIAGNOSIS — S22058D Other fracture of T5-T6 vertebra, subsequent encounter for fracture with routine healing: Secondary | ICD-10-CM | POA: Diagnosis not present

## 2016-02-24 DIAGNOSIS — S82401D Unspecified fracture of shaft of right fibula, subsequent encounter for closed fracture with routine healing: Secondary | ICD-10-CM | POA: Diagnosis not present

## 2016-02-24 DIAGNOSIS — M79604 Pain in right leg: Secondary | ICD-10-CM | POA: Diagnosis not present

## 2016-02-24 DIAGNOSIS — S22059D Unspecified fracture of T5-T6 vertebra, subsequent encounter for fracture with routine healing: Secondary | ICD-10-CM | POA: Diagnosis not present

## 2016-02-26 DIAGNOSIS — S82401D Unspecified fracture of shaft of right fibula, subsequent encounter for closed fracture with routine healing: Secondary | ICD-10-CM | POA: Diagnosis not present

## 2016-02-26 DIAGNOSIS — M6281 Muscle weakness (generalized): Secondary | ICD-10-CM | POA: Diagnosis not present

## 2016-02-26 DIAGNOSIS — S22059D Unspecified fracture of T5-T6 vertebra, subsequent encounter for fracture with routine healing: Secondary | ICD-10-CM | POA: Diagnosis not present

## 2016-02-26 DIAGNOSIS — R262 Difficulty in walking, not elsewhere classified: Secondary | ICD-10-CM | POA: Diagnosis not present

## 2016-02-26 DIAGNOSIS — S22058D Other fracture of T5-T6 vertebra, subsequent encounter for fracture with routine healing: Secondary | ICD-10-CM | POA: Diagnosis not present

## 2016-02-26 DIAGNOSIS — M79604 Pain in right leg: Secondary | ICD-10-CM | POA: Diagnosis not present

## 2016-02-27 DIAGNOSIS — S22059D Unspecified fracture of T5-T6 vertebra, subsequent encounter for fracture with routine healing: Secondary | ICD-10-CM | POA: Diagnosis not present

## 2016-02-27 DIAGNOSIS — S82841G Displaced bimalleolar fracture of right lower leg, subsequent encounter for closed fracture with delayed healing: Secondary | ICD-10-CM | POA: Diagnosis not present

## 2016-02-27 DIAGNOSIS — M79604 Pain in right leg: Secondary | ICD-10-CM | POA: Diagnosis not present

## 2016-02-27 DIAGNOSIS — M6281 Muscle weakness (generalized): Secondary | ICD-10-CM | POA: Diagnosis not present

## 2016-02-27 DIAGNOSIS — S82401D Unspecified fracture of shaft of right fibula, subsequent encounter for closed fracture with routine healing: Secondary | ICD-10-CM | POA: Diagnosis not present

## 2016-02-27 DIAGNOSIS — R262 Difficulty in walking, not elsewhere classified: Secondary | ICD-10-CM | POA: Diagnosis not present

## 2016-02-27 DIAGNOSIS — M1711 Unilateral primary osteoarthritis, right knee: Secondary | ICD-10-CM | POA: Diagnosis not present

## 2016-02-27 DIAGNOSIS — S22058D Other fracture of T5-T6 vertebra, subsequent encounter for fracture with routine healing: Secondary | ICD-10-CM | POA: Diagnosis not present

## 2016-03-03 DIAGNOSIS — Z9181 History of falling: Secondary | ICD-10-CM | POA: Diagnosis not present

## 2016-03-03 DIAGNOSIS — S82401D Unspecified fracture of shaft of right fibula, subsequent encounter for closed fracture with routine healing: Secondary | ICD-10-CM | POA: Diagnosis not present

## 2016-03-03 DIAGNOSIS — M6281 Muscle weakness (generalized): Secondary | ICD-10-CM | POA: Diagnosis not present

## 2016-03-03 DIAGNOSIS — R262 Difficulty in walking, not elsewhere classified: Secondary | ICD-10-CM | POA: Diagnosis not present

## 2016-03-03 DIAGNOSIS — S22059D Unspecified fracture of T5-T6 vertebra, subsequent encounter for fracture with routine healing: Secondary | ICD-10-CM | POA: Diagnosis not present

## 2016-03-03 DIAGNOSIS — S22058D Other fracture of T5-T6 vertebra, subsequent encounter for fracture with routine healing: Secondary | ICD-10-CM | POA: Diagnosis not present

## 2016-03-03 DIAGNOSIS — M79604 Pain in right leg: Secondary | ICD-10-CM | POA: Diagnosis not present

## 2016-03-04 DIAGNOSIS — S22058D Other fracture of T5-T6 vertebra, subsequent encounter for fracture with routine healing: Secondary | ICD-10-CM | POA: Diagnosis not present

## 2016-03-04 DIAGNOSIS — M6281 Muscle weakness (generalized): Secondary | ICD-10-CM | POA: Diagnosis not present

## 2016-03-04 DIAGNOSIS — M79604 Pain in right leg: Secondary | ICD-10-CM | POA: Diagnosis not present

## 2016-03-04 DIAGNOSIS — S82401D Unspecified fracture of shaft of right fibula, subsequent encounter for closed fracture with routine healing: Secondary | ICD-10-CM | POA: Diagnosis not present

## 2016-03-04 DIAGNOSIS — S22059D Unspecified fracture of T5-T6 vertebra, subsequent encounter for fracture with routine healing: Secondary | ICD-10-CM | POA: Diagnosis not present

## 2016-03-04 DIAGNOSIS — R262 Difficulty in walking, not elsewhere classified: Secondary | ICD-10-CM | POA: Diagnosis not present

## 2016-03-05 DIAGNOSIS — S22059D Unspecified fracture of T5-T6 vertebra, subsequent encounter for fracture with routine healing: Secondary | ICD-10-CM | POA: Diagnosis not present

## 2016-03-05 DIAGNOSIS — M79604 Pain in right leg: Secondary | ICD-10-CM | POA: Diagnosis not present

## 2016-03-05 DIAGNOSIS — R262 Difficulty in walking, not elsewhere classified: Secondary | ICD-10-CM | POA: Diagnosis not present

## 2016-03-05 DIAGNOSIS — M6281 Muscle weakness (generalized): Secondary | ICD-10-CM | POA: Diagnosis not present

## 2016-03-05 DIAGNOSIS — S82401D Unspecified fracture of shaft of right fibula, subsequent encounter for closed fracture with routine healing: Secondary | ICD-10-CM | POA: Diagnosis not present

## 2016-03-05 DIAGNOSIS — S22058D Other fracture of T5-T6 vertebra, subsequent encounter for fracture with routine healing: Secondary | ICD-10-CM | POA: Diagnosis not present

## 2016-03-08 DIAGNOSIS — S22058D Other fracture of T5-T6 vertebra, subsequent encounter for fracture with routine healing: Secondary | ICD-10-CM | POA: Diagnosis not present

## 2016-03-08 DIAGNOSIS — M6281 Muscle weakness (generalized): Secondary | ICD-10-CM | POA: Diagnosis not present

## 2016-03-08 DIAGNOSIS — M79604 Pain in right leg: Secondary | ICD-10-CM | POA: Diagnosis not present

## 2016-03-08 DIAGNOSIS — S82401D Unspecified fracture of shaft of right fibula, subsequent encounter for closed fracture with routine healing: Secondary | ICD-10-CM | POA: Diagnosis not present

## 2016-03-08 DIAGNOSIS — R262 Difficulty in walking, not elsewhere classified: Secondary | ICD-10-CM | POA: Diagnosis not present

## 2016-03-08 DIAGNOSIS — S22059D Unspecified fracture of T5-T6 vertebra, subsequent encounter for fracture with routine healing: Secondary | ICD-10-CM | POA: Diagnosis not present

## 2016-03-09 DIAGNOSIS — R262 Difficulty in walking, not elsewhere classified: Secondary | ICD-10-CM | POA: Diagnosis not present

## 2016-03-09 DIAGNOSIS — S22059D Unspecified fracture of T5-T6 vertebra, subsequent encounter for fracture with routine healing: Secondary | ICD-10-CM | POA: Diagnosis not present

## 2016-03-09 DIAGNOSIS — S22058D Other fracture of T5-T6 vertebra, subsequent encounter for fracture with routine healing: Secondary | ICD-10-CM | POA: Diagnosis not present

## 2016-03-09 DIAGNOSIS — M79604 Pain in right leg: Secondary | ICD-10-CM | POA: Diagnosis not present

## 2016-03-09 DIAGNOSIS — M6281 Muscle weakness (generalized): Secondary | ICD-10-CM | POA: Diagnosis not present

## 2016-03-09 DIAGNOSIS — S82401D Unspecified fracture of shaft of right fibula, subsequent encounter for closed fracture with routine healing: Secondary | ICD-10-CM | POA: Diagnosis not present

## 2016-03-11 DIAGNOSIS — S22059D Unspecified fracture of T5-T6 vertebra, subsequent encounter for fracture with routine healing: Secondary | ICD-10-CM | POA: Diagnosis not present

## 2016-03-11 DIAGNOSIS — S82401D Unspecified fracture of shaft of right fibula, subsequent encounter for closed fracture with routine healing: Secondary | ICD-10-CM | POA: Diagnosis not present

## 2016-03-11 DIAGNOSIS — S22058D Other fracture of T5-T6 vertebra, subsequent encounter for fracture with routine healing: Secondary | ICD-10-CM | POA: Diagnosis not present

## 2016-03-11 DIAGNOSIS — M79604 Pain in right leg: Secondary | ICD-10-CM | POA: Diagnosis not present

## 2016-03-11 DIAGNOSIS — M6281 Muscle weakness (generalized): Secondary | ICD-10-CM | POA: Diagnosis not present

## 2016-03-11 DIAGNOSIS — R262 Difficulty in walking, not elsewhere classified: Secondary | ICD-10-CM | POA: Diagnosis not present

## 2016-03-12 DIAGNOSIS — M1711 Unilateral primary osteoarthritis, right knee: Secondary | ICD-10-CM | POA: Diagnosis not present

## 2016-03-15 DIAGNOSIS — S22059D Unspecified fracture of T5-T6 vertebra, subsequent encounter for fracture with routine healing: Secondary | ICD-10-CM | POA: Diagnosis not present

## 2016-03-15 DIAGNOSIS — M79604 Pain in right leg: Secondary | ICD-10-CM | POA: Diagnosis not present

## 2016-03-15 DIAGNOSIS — S82401D Unspecified fracture of shaft of right fibula, subsequent encounter for closed fracture with routine healing: Secondary | ICD-10-CM | POA: Diagnosis not present

## 2016-03-15 DIAGNOSIS — M6281 Muscle weakness (generalized): Secondary | ICD-10-CM | POA: Diagnosis not present

## 2016-03-15 DIAGNOSIS — S22058D Other fracture of T5-T6 vertebra, subsequent encounter for fracture with routine healing: Secondary | ICD-10-CM | POA: Diagnosis not present

## 2016-03-15 DIAGNOSIS — R262 Difficulty in walking, not elsewhere classified: Secondary | ICD-10-CM | POA: Diagnosis not present

## 2016-03-17 DIAGNOSIS — S22059D Unspecified fracture of T5-T6 vertebra, subsequent encounter for fracture with routine healing: Secondary | ICD-10-CM | POA: Diagnosis not present

## 2016-03-17 DIAGNOSIS — M6281 Muscle weakness (generalized): Secondary | ICD-10-CM | POA: Diagnosis not present

## 2016-03-17 DIAGNOSIS — S82401D Unspecified fracture of shaft of right fibula, subsequent encounter for closed fracture with routine healing: Secondary | ICD-10-CM | POA: Diagnosis not present

## 2016-03-17 DIAGNOSIS — R262 Difficulty in walking, not elsewhere classified: Secondary | ICD-10-CM | POA: Diagnosis not present

## 2016-03-17 DIAGNOSIS — M79604 Pain in right leg: Secondary | ICD-10-CM | POA: Diagnosis not present

## 2016-03-17 DIAGNOSIS — S22058D Other fracture of T5-T6 vertebra, subsequent encounter for fracture with routine healing: Secondary | ICD-10-CM | POA: Diagnosis not present

## 2016-03-19 DIAGNOSIS — S22059D Unspecified fracture of T5-T6 vertebra, subsequent encounter for fracture with routine healing: Secondary | ICD-10-CM | POA: Diagnosis not present

## 2016-03-19 DIAGNOSIS — R262 Difficulty in walking, not elsewhere classified: Secondary | ICD-10-CM | POA: Diagnosis not present

## 2016-03-19 DIAGNOSIS — S82401D Unspecified fracture of shaft of right fibula, subsequent encounter for closed fracture with routine healing: Secondary | ICD-10-CM | POA: Diagnosis not present

## 2016-03-19 DIAGNOSIS — S22058D Other fracture of T5-T6 vertebra, subsequent encounter for fracture with routine healing: Secondary | ICD-10-CM | POA: Diagnosis not present

## 2016-03-19 DIAGNOSIS — M79604 Pain in right leg: Secondary | ICD-10-CM | POA: Diagnosis not present

## 2016-03-19 DIAGNOSIS — M6281 Muscle weakness (generalized): Secondary | ICD-10-CM | POA: Diagnosis not present

## 2016-03-23 DIAGNOSIS — M79604 Pain in right leg: Secondary | ICD-10-CM | POA: Diagnosis not present

## 2016-03-23 DIAGNOSIS — S22058D Other fracture of T5-T6 vertebra, subsequent encounter for fracture with routine healing: Secondary | ICD-10-CM | POA: Diagnosis not present

## 2016-03-23 DIAGNOSIS — M6281 Muscle weakness (generalized): Secondary | ICD-10-CM | POA: Diagnosis not present

## 2016-03-23 DIAGNOSIS — R262 Difficulty in walking, not elsewhere classified: Secondary | ICD-10-CM | POA: Diagnosis not present

## 2016-03-23 DIAGNOSIS — S22059D Unspecified fracture of T5-T6 vertebra, subsequent encounter for fracture with routine healing: Secondary | ICD-10-CM | POA: Diagnosis not present

## 2016-03-23 DIAGNOSIS — S82401D Unspecified fracture of shaft of right fibula, subsequent encounter for closed fracture with routine healing: Secondary | ICD-10-CM | POA: Diagnosis not present

## 2016-04-08 DIAGNOSIS — E1165 Type 2 diabetes mellitus with hyperglycemia: Secondary | ICD-10-CM | POA: Diagnosis not present

## 2016-04-08 DIAGNOSIS — Z5181 Encounter for therapeutic drug level monitoring: Secondary | ICD-10-CM | POA: Diagnosis not present

## 2016-04-21 DIAGNOSIS — M79675 Pain in left toe(s): Secondary | ICD-10-CM | POA: Diagnosis not present

## 2016-04-21 DIAGNOSIS — M79674 Pain in right toe(s): Secondary | ICD-10-CM | POA: Diagnosis not present

## 2016-04-21 DIAGNOSIS — B351 Tinea unguium: Secondary | ICD-10-CM | POA: Diagnosis not present

## 2016-05-10 ENCOUNTER — Encounter: Payer: Self-pay | Admitting: Emergency Medicine

## 2016-05-10 ENCOUNTER — Emergency Department
Admission: EM | Admit: 2016-05-10 | Discharge: 2016-05-10 | Disposition: A | Discharge status: Home / Self Care | Payer: Medicare Other | Attending: Emergency Medicine | Admitting: Emergency Medicine

## 2016-05-10 ENCOUNTER — Emergency Department: Payer: Medicare Other

## 2016-05-10 DIAGNOSIS — Z794 Long term (current) use of insulin: Secondary | ICD-10-CM | POA: Insufficient documentation

## 2016-05-10 DIAGNOSIS — N189 Chronic kidney disease, unspecified: Secondary | ICD-10-CM | POA: Diagnosis not present

## 2016-05-10 DIAGNOSIS — R0789 Other chest pain: Secondary | ICD-10-CM | POA: Insufficient documentation

## 2016-05-10 DIAGNOSIS — I129 Hypertensive chronic kidney disease with stage 1 through stage 4 chronic kidney disease, or unspecified chronic kidney disease: Secondary | ICD-10-CM | POA: Diagnosis not present

## 2016-05-10 DIAGNOSIS — Z79899 Other long term (current) drug therapy: Secondary | ICD-10-CM | POA: Diagnosis not present

## 2016-05-10 DIAGNOSIS — E1122 Type 2 diabetes mellitus with diabetic chronic kidney disease: Secondary | ICD-10-CM | POA: Insufficient documentation

## 2016-05-10 DIAGNOSIS — Z791 Long term (current) use of non-steroidal anti-inflammatories (NSAID): Secondary | ICD-10-CM | POA: Diagnosis not present

## 2016-05-10 DIAGNOSIS — R079 Chest pain, unspecified: Secondary | ICD-10-CM | POA: Diagnosis not present

## 2016-05-10 LAB — COMPREHENSIVE METABOLIC PANEL
ALK PHOS: 82 U/L (ref 38–126)
ALT: 14 U/L — AB (ref 17–63)
AST: 15 U/L (ref 15–41)
Albumin: 3.5 g/dL (ref 3.5–5.0)
Anion gap: 7 (ref 5–15)
BILIRUBIN TOTAL: 0.8 mg/dL (ref 0.3–1.2)
BUN: 22 mg/dL — ABNORMAL HIGH (ref 6–20)
CALCIUM: 8.8 mg/dL — AB (ref 8.9–10.3)
CO2: 24 mmol/L (ref 22–32)
CREATININE: 1.28 mg/dL — AB (ref 0.61–1.24)
Chloride: 103 mmol/L (ref 101–111)
GFR, EST AFRICAN AMERICAN: 57 mL/min — AB (ref >60)
GFR, EST NON AFRICAN AMERICAN: 49 mL/min — AB (ref >60)
Glucose, Bld: 288 mg/dL — ABNORMAL HIGH (ref 65–99)
Potassium: 4.2 mmol/L (ref 3.5–5.1)
Sodium: 134 mmol/L — ABNORMAL LOW (ref 135–145)
Total Protein: 7.5 g/dL (ref 6.5–8.1)

## 2016-05-10 LAB — CBC WITH DIFFERENTIAL/PLATELET
BASOS PCT: 1 %
Basophils Absolute: 0.1 10*3/uL (ref 0–0.1)
EOS ABS: 0.1 10*3/uL (ref 0–0.7)
Eosinophils Relative: 1 %
HCT: 33 % — ABNORMAL LOW (ref 40.0–52.0)
HEMOGLOBIN: 11.3 g/dL — AB (ref 13.0–18.0)
Lymphocytes Relative: 26 %
Lymphs Abs: 1.9 10*3/uL (ref 1.0–3.6)
MCH: 25.6 pg — ABNORMAL LOW (ref 26.0–34.0)
MCHC: 34.2 g/dL (ref 32.0–36.0)
MCV: 75 fL — ABNORMAL LOW (ref 80.0–100.0)
MONOS PCT: 12 %
Monocytes Absolute: 0.8 10*3/uL (ref 0.2–1.0)
NEUTROS PCT: 60 %
Neutro Abs: 4.5 10*3/uL (ref 1.4–6.5)
Platelets: 166 10*3/uL (ref 150–440)
RBC: 4.4 MIL/uL (ref 4.40–5.90)
RDW: 14.8 % — ABNORMAL HIGH (ref 11.5–14.5)
WBC: 7.4 10*3/uL (ref 3.8–10.6)

## 2016-05-10 LAB — TROPONIN I
Troponin I: <0.03 ng/mL
Troponin I: 0.03 ng/mL
Troponin I: <0.03 ng/mL

## 2016-05-10 LAB — LIPASE, BLOOD: LIPASE: 41 U/L (ref 11–51)

## 2016-05-10 MED ORDER — OXYCODONE-ACETAMINOPHEN 5-325 MG PO TABS
1.0000 | ORAL_TABLET | Freq: Once | ORAL | Status: AC
  Administered 2016-05-10: 1 via ORAL
  Filled 2016-05-10: qty 1

## 2016-05-10 MED ORDER — FENTANYL CITRATE (PF) 100 MCG/2ML IJ SOLN
50.0000 ug | Freq: Once | INTRAMUSCULAR | Status: AC
  Administered 2016-05-10: 50 ug via INTRAVENOUS
  Filled 2016-05-10: qty 2

## 2016-05-10 NOTE — ED Triage Notes (Addendum)
Pt presents to ED from [facility] c/o chest pain near right side of chest since this am. Pt has c/o chronic pain in right leg and arms.[facility] gave 5 mg Nitro and EMS gave 5 mg nitro with sodium chloride 0.9% IVF

## 2016-05-10 NOTE — ED Provider Notes (Addendum)
[facility] Emergency Department Provider Note  ____________________________________________   I have reviewed the triage vital signs and the nursing notes.   HISTORY  Chief Complaint Chest Pain    HPI Daniel [name] is a 82 y.o. male He states that he has chronic pain from [place] his wheelchair. Today, he was willing his wheelchair and he felt like he pulled a muscle in his right chest wall. It is tender to very particular spot in his chest wall. No shortness of breath. Denies fever. His chronic lower extremity pain and swelling. That is not changed. He like a pain pill. He states he normally takes pain medication but is not helping sufficiently. He states that the pain is nonpleuritic pleuritic nonradiating, sharp, anthe exact spot "under my right breast" where it hurts.       Past Medical History:  Diagnosis Date  . Arthritis   . Chronic kidney disease   . Dementia   . Diabetes mellitus without complication (HCC)    Type 2  . Hyperlipidemia   . Hypertension   . Low back pain   . Muscle weakness (generalized)   . Other fracture of t5-T6 vertebra, subsequent encounter for fracture with routine healing   . Polyosteoarthritis   . Renal disorder   . Type 2 diabetes mellitus ([place])   . Unspecified fracture of t5-T6 vertebra, subsequent encounter for fracture with routine healing     Patient Active Problem List   Diagnosis Date Noted  . Tibia/fibula fracture 09/26/2015    Past Surgical History:  Procedure Laterality Date  . BACK SURGERY    . CHOLECYSTECTOMY    . HERNIA REPAIR    . KNEE SURGERY    . teeth pulled  Bilateral 08/13/14   teeth pulled and bottom gum removed    Prior to Admission medications   Medication Sig Start Date End Date Taking? Authorizing Provider  amLODipine (NORVASC) 10 MG tablet Take 10 mg by mouth daily.   Yes Historical Provider, MD  atorvastatin (LIPITOR) 10 MG tablet Take 5 mg by mouth daily.   Yes Historical  Provider, MD  bisacodyl (DULCOLAX) 5 MG EC tablet Take 5 mg by mouth 2 (two) times daily as needed for moderate constipation.   Yes Historical Provider, MD  bumetanide (BUMEX) 0.5 MG tablet Take 0.5 mg by mouth daily.   Yes Historical Provider, MD  Cholecalciferol 50000 units capsule Take 50,000 Units by mouth daily.   Yes Historical Provider, MD  diclofenac sodium (VOLTAREN) 1 % GEL Apply 4 g topically every 6 (six) hours as needed.   Yes Historical Provider, MD  gabapentin (NEURONTIN) 100 MG capsule Take 100 mg by mouth 3 (three) times daily.   Yes Historical Provider, MD  guaifenesin (GUIATUSS) 100 MG/5ML syrup Take 100 mg by mouth every 6 (six) hours as needed for cough.   Yes Historical Provider, MD  Hypromellose (NATURAL BALANCE TEARS) 0.4 % SOLN Apply 1 drop to eye every 6 (six) hours as needed (for dry eyes).   Yes Historical Provider, MD  ibuprofen (ADVIL,MOTRIN) 400 MG tablet Take 1 tablet (400 mg total) by mouth every 6 (six) hours as needed for headache or moderate pain. 09/29/15  Yes Nicholes Mango, MD  Insulin Glargine (TOUJEO SOLOSTAR) 300 UNIT/ML SOPN Inject 45 Units into the skin daily. 09/29/15  Yes Nicholes Mango, MD  insulin lispro (HUMALOG) 100 UNIT/ML injection Inject into the skin 3 (three) times daily before meals. Inject as per sliding scale  250-350=2 units 351-450=4 units >  451call optum   Yes Historical Provider, MD  losartan (COZAAR) 25 MG tablet Take 25 mg by mouth daily.   Yes Historical Provider, MD  lubiprostone (AMITIZA) 24 MCG capsule Take 24 mcg by mouth 2 (two) times daily with a meal.   Yes Historical Provider, MD  magnesium oxide (MAG-OX) 400 MG tablet Take 400 mg by mouth daily.   Yes Historical Provider, MD  memantine (NAMENDA XR) 28 MG CP24 24 hr capsule Take 28 mg by mouth daily.   Yes Historical Provider, MD  sitaGLIPtin (JANUVIA) 50 MG tablet Take 50 mg by mouth daily. Reported on 07/30/2015   Yes Historical Provider, MD  traMADol (ULTRAM) 50 MG tablet Take 1  tablet (50 mg total) by mouth every 6 (six) hours. 09/29/15  Yes Nicholes Mango, MD    Allergies Oxycodone and Codeine  Family History  Problem Relation Age of Onset  . Hypertension Father     Social History Social History  Substance Use Topics  . Smoking status: Never Smoker  . Smokeless tobacco: Not on file  . Alcohol use No    Review of Systems Constitutional: No fever/chills Eyes: No visual changes. ENT: No sore throat. No stiff neck no neck pain Cardiovascular: see history of present illness regardingchest pain. Respiratory: Denies shortness of breath. Gastrointestinal:   no vomiting.  No diarrhea.  No constipation. Genitourinary: Negative for dysuria. Musculoskeletal: ositive chroniclower extremity swelling Skin: Negative for rash. Neurological: Negative for severe headaches, focal weakness or numbness. 10-point ROS otherwise negative.  ____________________________________________   PHYSICAL EXAM:  VITAL SIGNS: ED Triage Vitals  Enc Vitals Group     BP 05/10/16 1114 (!) 146/95     Pulse Rate 05/10/16 1114 85     Resp 05/10/16 1114 20     Temp 05/10/16 1114 99.1 F (37.3 C)     Temp Source 05/10/16 1114 Oral     SpO2 05/10/16 1114 98 %     Weight 05/10/16 1115 252 lb 14.4 oz (114.7 kg)     Height 05/10/16 1115 5\' 7"  (1.702 m)     Head Circumference --      Peak Flow --      Pain Score 05/10/16 1116 5     Pain Loc --      Pain Edu? --      Excl. in [place]? --     Constitutional: Alert and oriented. Well appearing and in no acute distress. Eyes: Conjunctivae are normal. PERRL. EOMI. Head: Atraumatic. Nose: No congestion/rhinnorhea. Mouth/Throat: Mucous membranes are moist.  Oropharynx non-erythematous. Neck: No stridor.   Nontender with no meningismus Cardiovascular: Normal rate, regular rhythm. Grossly normal heart sounds.  Good peripheral circulation. Chest: Tender to palpation right chest wall and testing exact spot designated around the right anterior  pectoralis muscle . When I touch this area patient states "ouch that's the pain right there". No crepitus no flail for C-14chest. Respiratory: Normal respiratory effort.  No retractions. Lungs CTAB. Abdominal: Soft and nontender. No distention. No guarding no rebound Back:  There is no focal tenderness or step off.  there is no midline tenderness there are no lesions noted. there is no CVA tenderness Musculoskeletal: diffuse nonspecific lower extremity tenderness, no upper extremity tenderness. No joint effusions, no DVT signs strong distal pulses ilateral symmetedema Neurologic:  Normal speech and language. No gross focal neurologic deficits are appreciated.  Skin:  Skin is warm, dry and intact. No rash noted. Psychiatric: Mood and affect are normal. Speech and behavior are  normal.  ____________________________________________   LABS (all labs ordered are listed, but only abnormal results are displayed)  Labs Reviewed  COMPREHENSIVE METABOLIC PANEL - Abnormal; Notable for the following:       Result Value   Sodium 134 (*)    Glucose, Bld 288 (*)    BUN 22 (*)    Creatinine, Ser 1.28 (*)    Calcium 8.8 (*)    ALT 14 (*)    GFR calc non Af Amer 49 (*)    GFR calc Af Amer 57 (*)    All other components within normal limits  CBC WITH DIFFERENTIAL/PLATELET - Abnormal; Notable for the following:    Hemoglobin 11.3 (*)    HCT 33.0 (*)    MCV 75.0 (*)    MCH 25.6 (*)    RDW 14.8 (*)    All other components within normal limits  LIPASE, BLOOD  TROPONIN I  TROPONIN I   ____________________________________________  EKG  I personally interpreted any EKGs ordered by me or triage Sinus rhythm at 80 bpm no acute ST elevation no acute ST depression normal axis unremarkable EKG ____________________________________________  RADIOLOGY  I reviewed any imaging ordered by me or triage that were performed during my shift and, if possible, patient and/or family made aware of any abnormal  findings. ____________________________________________   PROCEDURES  Procedure(s) performed: None  Procedures  Critical Care performed: None  ____________________________________________   INITIAL IMPRESSION / ASSESSMENT AND PLAN / ED COURSE  Pertinent labs & imaging results that were available during my care of the patient were reviewed by me and considered in my medical decision making (see chart for details).  Patient with very appreciable chest wall pain from his wheelchair chest x-ray blood work reassuring. We'll check a second troponin because of multiple different risk factors and if that is negative for symptoms. We'll if he feels better. Afterfentanyl, patient states his pain is much better.At this time, there does not appear to be clinical evidence to support the diagnosis of pulmonary embolus, dissection, myocarditis, endocarditis, pericarditis, pericardial tamponade, acute coronary syndrome, pneumothorax, pneumonia, or any other acute intrathoracic pathology that will require admission or acute intervention. Nor is there evidence of any significant intra-abdominal pathology causing this discomfort.  ----------------------------------------- 3:24 PM on 05/10/2016 -----------------------------------------  Repeat troponin not elevated, however 0.03 is borderline and will recheck as an insurance given the patient's multiple medical problems. Signed out at the end of my shift to dr. Joni Fears.  Clinical Course   ____________________________________________   FINAL CLINICAL IMPRESSION(S) / ED DIAGNOSES  Final diagnoses:  None      This chart was dictated using voice recognition software.  Despite best efforts to proofread,  errors can occur which can change meaning.      Schuyler Amor, MD 05/10/16 [place], MD 05/10/16 [phone]

## 2016-05-10 NOTE — ED Provider Notes (Signed)
Symptoms remain controlled, vital signs unremarkable, troponin unremarkable 3. We'll discharge home to follow up with primary care.   Carrie Mew, MD 05/10/16 [phone]

## 2016-05-28 DIAGNOSIS — I129 Hypertensive chronic kidney disease with stage 1 through stage 4 chronic kidney disease, or unspecified chronic kidney disease: Secondary | ICD-10-CM | POA: Diagnosis not present

## 2016-05-28 DIAGNOSIS — M545 Low back pain: Secondary | ICD-10-CM | POA: Diagnosis not present

## 2016-05-28 DIAGNOSIS — E119 Type 2 diabetes mellitus without complications: Secondary | ICD-10-CM | POA: Diagnosis not present

## 2016-05-28 DIAGNOSIS — N182 Chronic kidney disease, stage 2 (mild): Secondary | ICD-10-CM | POA: Diagnosis not present

## 2016-06-29 ENCOUNTER — Other Ambulatory Visit: Payer: Self-pay | Admitting: Orthopedic Surgery

## 2016-06-29 DIAGNOSIS — M545 Low back pain: Secondary | ICD-10-CM

## 2016-06-29 DIAGNOSIS — M47816 Spondylosis without myelopathy or radiculopathy, lumbar region: Secondary | ICD-10-CM

## 2016-07-09 ENCOUNTER — Ambulatory Visit
Admission: RE | Admit: 2016-07-09 | Discharge: 2016-07-09 | Disposition: A | Discharge status: Home / Self Care | Payer: Medicare Other | Source: Ambulatory Visit | Attending: Orthopedic Surgery | Admitting: Orthopedic Surgery

## 2016-07-09 DIAGNOSIS — Q7649 Other congenital malformations of spine, not associated with scoliosis: Secondary | ICD-10-CM | POA: Insufficient documentation

## 2016-07-09 DIAGNOSIS — M545 Low back pain: Secondary | ICD-10-CM | POA: Diagnosis not present

## 2016-07-09 DIAGNOSIS — M47816 Spondylosis without myelopathy or radiculopathy, lumbar region: Secondary | ICD-10-CM | POA: Diagnosis present

## 2016-07-09 DIAGNOSIS — M5136 Other intervertebral disc degeneration, lumbar region: Secondary | ICD-10-CM | POA: Diagnosis not present

## 2016-07-09 DIAGNOSIS — M48061 Spinal stenosis, lumbar region without neurogenic claudication: Secondary | ICD-10-CM | POA: Insufficient documentation

## 2017-08-17 DIAGNOSIS — E785 Hyperlipidemia, unspecified: Secondary | ICD-10-CM | POA: Diagnosis not present

## 2017-08-17 DIAGNOSIS — I129 Hypertensive chronic kidney disease with stage 1 through stage 4 chronic kidney disease, or unspecified chronic kidney disease: Secondary | ICD-10-CM | POA: Diagnosis not present

## 2017-08-17 DIAGNOSIS — N183 Chronic kidney disease, stage 3 (moderate): Secondary | ICD-10-CM | POA: Diagnosis not present

## 2017-08-17 DIAGNOSIS — R413 Other amnesia: Secondary | ICD-10-CM | POA: Diagnosis not present

## 2017-08-17 DIAGNOSIS — E1122 Type 2 diabetes mellitus with diabetic chronic kidney disease: Secondary | ICD-10-CM | POA: Diagnosis not present

## 2017-08-17 DIAGNOSIS — Z993 Dependence on wheelchair: Secondary | ICD-10-CM | POA: Diagnosis not present

## 2017-09-12 DIAGNOSIS — R531 Weakness: Secondary | ICD-10-CM | POA: Diagnosis not present

## 2017-09-12 DIAGNOSIS — L603 Nail dystrophy: Secondary | ICD-10-CM | POA: Diagnosis not present

## 2017-09-12 DIAGNOSIS — R262 Difficulty in walking, not elsewhere classified: Secondary | ICD-10-CM | POA: Diagnosis not present

## 2017-09-12 DIAGNOSIS — F0390 Unspecified dementia without behavioral disturbance: Secondary | ICD-10-CM | POA: Diagnosis not present

## 2017-09-12 DIAGNOSIS — E1051 Type 1 diabetes mellitus with diabetic peripheral angiopathy without gangrene: Secondary | ICD-10-CM | POA: Diagnosis not present

## 2017-09-12 DIAGNOSIS — E785 Hyperlipidemia, unspecified: Secondary | ICD-10-CM | POA: Diagnosis not present

## 2017-09-12 DIAGNOSIS — E119 Type 2 diabetes mellitus without complications: Secondary | ICD-10-CM | POA: Diagnosis not present

## 2017-09-12 DIAGNOSIS — I10 Essential (primary) hypertension: Secondary | ICD-10-CM | POA: Diagnosis not present

## 2017-09-12 DIAGNOSIS — B351 Tinea unguium: Secondary | ICD-10-CM | POA: Diagnosis not present

## 2017-09-12 DIAGNOSIS — Z794 Long term (current) use of insulin: Secondary | ICD-10-CM | POA: Diagnosis not present

## 2017-09-13 DIAGNOSIS — M6281 Muscle weakness (generalized): Secondary | ICD-10-CM | POA: Diagnosis not present

## 2017-09-13 DIAGNOSIS — I10 Essential (primary) hypertension: Secondary | ICD-10-CM | POA: Diagnosis not present

## 2017-09-14 DIAGNOSIS — M6281 Muscle weakness (generalized): Secondary | ICD-10-CM | POA: Diagnosis not present

## 2017-09-14 DIAGNOSIS — I10 Essential (primary) hypertension: Secondary | ICD-10-CM | POA: Diagnosis not present

## 2017-09-15 DIAGNOSIS — M6281 Muscle weakness (generalized): Secondary | ICD-10-CM | POA: Diagnosis not present

## 2017-09-15 DIAGNOSIS — I10 Essential (primary) hypertension: Secondary | ICD-10-CM | POA: Diagnosis not present

## 2017-09-16 DIAGNOSIS — M6281 Muscle weakness (generalized): Secondary | ICD-10-CM | POA: Diagnosis not present

## 2017-09-16 DIAGNOSIS — I10 Essential (primary) hypertension: Secondary | ICD-10-CM | POA: Diagnosis not present

## 2017-09-19 DIAGNOSIS — M6281 Muscle weakness (generalized): Secondary | ICD-10-CM | POA: Diagnosis not present

## 2017-09-19 DIAGNOSIS — I10 Essential (primary) hypertension: Secondary | ICD-10-CM | POA: Diagnosis not present

## 2017-09-20 DIAGNOSIS — M6281 Muscle weakness (generalized): Secondary | ICD-10-CM | POA: Diagnosis not present

## 2017-09-20 DIAGNOSIS — I10 Essential (primary) hypertension: Secondary | ICD-10-CM | POA: Diagnosis not present

## 2017-09-21 DIAGNOSIS — M6281 Muscle weakness (generalized): Secondary | ICD-10-CM | POA: Diagnosis not present

## 2017-09-21 DIAGNOSIS — I10 Essential (primary) hypertension: Secondary | ICD-10-CM | POA: Diagnosis not present

## 2017-09-22 DIAGNOSIS — I10 Essential (primary) hypertension: Secondary | ICD-10-CM | POA: Diagnosis not present

## 2017-09-22 DIAGNOSIS — M6281 Muscle weakness (generalized): Secondary | ICD-10-CM | POA: Diagnosis not present

## 2017-09-23 DIAGNOSIS — M6281 Muscle weakness (generalized): Secondary | ICD-10-CM | POA: Diagnosis not present

## 2017-09-23 DIAGNOSIS — I10 Essential (primary) hypertension: Secondary | ICD-10-CM | POA: Diagnosis not present

## 2017-09-26 DIAGNOSIS — M6281 Muscle weakness (generalized): Secondary | ICD-10-CM | POA: Diagnosis not present

## 2017-09-26 DIAGNOSIS — I10 Essential (primary) hypertension: Secondary | ICD-10-CM | POA: Diagnosis not present

## 2017-09-27 DIAGNOSIS — M6281 Muscle weakness (generalized): Secondary | ICD-10-CM | POA: Diagnosis not present

## 2017-09-27 DIAGNOSIS — I10 Essential (primary) hypertension: Secondary | ICD-10-CM | POA: Diagnosis not present

## 2017-09-28 DIAGNOSIS — M6281 Muscle weakness (generalized): Secondary | ICD-10-CM | POA: Diagnosis not present

## 2017-09-28 DIAGNOSIS — I10 Essential (primary) hypertension: Secondary | ICD-10-CM | POA: Diagnosis not present

## 2017-09-29 DIAGNOSIS — M6281 Muscle weakness (generalized): Secondary | ICD-10-CM | POA: Diagnosis not present

## 2017-09-29 DIAGNOSIS — I10 Essential (primary) hypertension: Secondary | ICD-10-CM | POA: Diagnosis not present

## 2017-09-30 ENCOUNTER — Ambulatory Visit (HOSPITAL_COMMUNITY)
Admission: EM | Admit: 2017-09-30 | Discharge: 2017-09-30 | Disposition: A | Discharge status: Home / Self Care | Payer: Medicare HMO | Attending: Internal Medicine | Admitting: Internal Medicine

## 2017-09-30 ENCOUNTER — Other Ambulatory Visit: Payer: Self-pay

## 2017-09-30 ENCOUNTER — Encounter (HOSPITAL_COMMUNITY): Payer: Self-pay | Admitting: Family Medicine

## 2017-09-30 DIAGNOSIS — J40 Bronchitis, not specified as acute or chronic: Secondary | ICD-10-CM | POA: Diagnosis not present

## 2017-09-30 DIAGNOSIS — I10 Essential (primary) hypertension: Secondary | ICD-10-CM | POA: Diagnosis not present

## 2017-09-30 DIAGNOSIS — M6281 Muscle weakness (generalized): Secondary | ICD-10-CM | POA: Diagnosis not present

## 2017-09-30 MED ORDER — FLUTICASONE PROPIONATE 50 MCG/ACT NA SUSP: 2.0000 | Freq: Every day | NASAL | 0 refills | Status: AC

## 2017-09-30 MED ORDER — IPRATROPIUM BROMIDE 0.06 % NA SOLN: 2.0000 | Freq: Four times a day (QID) | NASAL | 0 refills | Status: AC

## 2017-09-30 MED ORDER — AZITHROMYCIN 250 MG PO TABS: 250.0000 mg | ORAL_TABLET | Freq: Every day | ORAL | 0 refills | Status: DC

## 2017-09-30 NOTE — ED Triage Notes (Signed)
Pt reports nasal congestion, cough, loss of appetite and hoarse voice for over one week.

## 2017-09-30 NOTE — Discharge Instructions (Signed)
Azithromycin for bronchitis. Start flonase, atrovent nasal spray for nasal congestion/drainage. You can use over the counter nasal saline rinse such as neti pot for nasal congestion. Keep hydrated, your urine should be clear to pale yellow in color. Tylenol/motrin for fever and pain. Monitor for any worsening of symptoms, chest pain, shortness of breath, wheezing, swelling of the throat, go to the emergency department for further evaluation. Follow up with PCP in 5 days for recheck.  Blood pressure lower today, could be due to decreased fluid intake. Please try to drink as much fluids as possible and monitor blood pressure. If experiencing continued low blood pressure, dizziness, weakness, confusion, altered mental status, passing out, go to the emergency department for further evaluation.

## 2017-09-30 NOTE — ED Provider Notes (Signed)
[facility]    CSN: [id number] Arrival date & time: 09/30/17  1009     History   Chief Complaint Chief Complaint  Patient presents with  . URI    HPI Daniel [name] is a 84 y.o. male.   84 year old male with history of dementia, DM, CKD comes in with family members for 10-12 day history of URI symptoms. Productive cough, rhinorrhea, nasal congestion. States that has had loss of appetite and hoarse voise.  Denies abdominal pain, nausea, vomiting.  Denies chest pain, shortness of breath, wheezing, palpitations.  Denies dizziness, weakness, syncope.  Tmax 99.1, no antipyretics.  Has been given OTC cough syrup without relief.  Still eating and drinking without problems.  Never smoker.  Last A1c around 8.      Past Medical History:  Diagnosis Date  . Arthritis   . Chronic kidney disease   . Dementia   . Diabetes mellitus without complication (HCC)    Type 2  . Hyperlipidemia   . Hypertension   . Low back pain   . Muscle weakness (generalized)   . Other fracture of t5-T6 vertebra, subsequent encounter for fracture with routine healing   . Polyosteoarthritis   . Renal disorder   . Type 2 diabetes mellitus ([place])   . Unspecified fracture of t5-T6 vertebra, subsequent encounter for fracture with routine healing     Patient Active Problem List   Diagnosis Date Noted  . Tibia/fibula fracture 09/26/2015    Past Surgical History:  Procedure Laterality Date  . BACK SURGERY    . CHOLECYSTECTOMY    . HERNIA REPAIR    . KNEE SURGERY    . teeth pulled  Bilateral 08/13/14   teeth pulled and bottom gum removed       Home Medications    Prior to Admission medications   Medication Sig Start Date End Date Taking? Authorizing Provider  amLODipine (NORVASC) 10 MG tablet Take 10 mg by mouth daily.    [provider]  atorvastatin (LIPITOR) 10 MG tablet Take 5 mg by mouth daily.    [provider]  azithromycin (ZITHROMAX) 250 MG tablet Take 1 tablet  (250 mg total) by mouth daily. Take first 2 tablets together, then 1 every day until finished. 09/30/17   Tasia Catchings, Amy V, PA-C  bisacodyl (DULCOLAX) 5 MG EC tablet Take 5 mg by mouth 2 (two) times daily as needed for moderate constipation.    [provider]  bumetanide (BUMEX) 0.5 MG tablet Take 0.5 mg by mouth daily.    [provider]  Cholecalciferol 50000 units capsule Take 50,000 Units by mouth daily.    [provider]  diclofenac sodium (VOLTAREN) 1 % GEL Apply 4 g topically every 6 (six) hours as needed.    [provider]  fluticasone (FLONASE) 50 MCG/ACT nasal spray Place 2 sprays into both nostrils daily. 09/30/17   Tasia Catchings, Amy V, PA-C  gabapentin (NEURONTIN) 100 MG capsule Take 100 mg by mouth 3 (three) times daily.    [provider]  guaifenesin (GUIATUSS) 100 MG/5ML syrup Take 100 mg by mouth every 6 (six) hours as needed for cough.    [provider]  Hypromellose (NATURAL BALANCE TEARS) 0.4 % SOLN Apply 1 drop to eye every 6 (six) hours as needed (for dry eyes).    [provider]  ibuprofen (ADVIL,MOTRIN) 400 MG tablet Take 1 tablet (400 mg total) by mouth every 6 (six) hours as needed for headache or  moderate pain. 09/29/15   Gouru, Illene Silver, MD  Insulin Glargine (TOUJEO SOLOSTAR) 300 UNIT/ML SOPN Inject 45 Units into the skin daily. 09/29/15   Gouru, Illene Silver, MD  insulin lispro (HUMALOG) 100 UNIT/ML injection Inject into the skin 3 (three) times daily before meals. Inject as per sliding scale  250-350=2 units 351-450=4 units >451call optum    [provider]  ipratropium (ATROVENT) 0.06 % nasal spray Place 2 sprays into both nostrils 4 (four) times daily. 09/30/17   Tasia Catchings, Amy V, PA-C  losartan (COZAAR) 25 MG tablet Take 25 mg by mouth daily.    [provider]  lubiprostone (AMITIZA) 24 MCG capsule Take 24 mcg by mouth 2 (two) times daily with a meal.    [provider]  magnesium oxide (MAG-OX) 400 MG tablet  Take 400 mg by mouth daily.    [provider]  memantine (NAMENDA XR) 28 MG CP24 24 hr capsule Take 28 mg by mouth daily.    [provider]  sitaGLIPtin (JANUVIA) 50 MG tablet Take 50 mg by mouth daily. Reported on 07/30/2015    [provider]  traMADol (ULTRAM) 50 MG tablet Take 1 tablet (50 mg total) by mouth every 6 (six) hours. 09/29/15   Nicholes Mango, MD    Family History Family History  Problem Relation Age of Onset  . Hypertension Father     Social History Social History   Tobacco Use  . Smoking status: Never Smoker  . Smokeless tobacco: Never Used  Substance Use Topics  . Alcohol use: No  . Drug use: Not on file     Allergies   Oxycodone and Codeine   Review of Systems Review of Systems  Reason unable to perform ROS: See HPI as above.     Physical Exam Triage Vital Signs ED Triage Vitals [09/30/17 1053]  Enc Vitals Group     BP 98/70     Pulse Rate 68     Resp      Temp 99.1 F (37.3 C)     Temp Source Oral     SpO2 98 %     Weight      Height      Head Circumference      [facility]      Pain Score      Pain Loc      Pain Edu?      Excl. in [place]?    No data found.  Updated Vital Signs BP 98/70 (BP Location: Left Arm)   Pulse 68   Temp 99.1 F (37.3 C) (Oral)   SpO2 98%   Physical Exam  Constitutional: He is oriented to person, place, and time. He appears well-developed and well-nourished. No distress.  Wheelchair-bound.  Nontoxic in appearance.  HENT:  Head: Normocephalic and atraumatic.  Right Ear: Tympanic membrane, external ear and ear canal normal. Tympanic membrane is not erythematous and not bulging.  Left Ear: External ear and ear canal normal. Tympanic membrane is not erythematous and not bulging.  Nose: Mucosal edema and rhinorrhea present. Right sinus exhibits no maxillary sinus tenderness and no frontal sinus tenderness. Left sinus exhibits no maxillary sinus tenderness and no frontal sinus tenderness.    Mouth/Throat: Uvula is midline, oropharynx is clear and moist and mucous membranes are normal. No tonsillar exudate.  Eyes: Conjunctivae are normal. Pupils are equal, round, and reactive to light.  Neck: Normal range of motion. Neck supple.  Cardiovascular: Normal rate, regular rhythm and normal heart sounds.  Exam reveals no gallop and no friction rub.  No murmur heard. Pulmonary/Chest: Effort normal and breath sounds normal. He has no decreased breath sounds. He has no wheezes. He has no rhonchi. He has no rales.  Lymphadenopathy:    He has no cervical adenopathy.  Neurological: He is alert and oriented to person, place, and time. He has normal strength.  Skin: Skin is warm and dry.  Psychiatric: He has a normal mood and affect. His behavior is normal. Judgment normal.     UC Treatments / Results  Labs (all labs ordered are listed, but only abnormal results are displayed) Labs Reviewed - No data to display  EKG  EKG Interpretation None       Radiology No results found.  Procedures Procedures (including critical care time)  Medications Ordered in UC Medications - No data to display   Initial Impression / Assessment and Plan / UC Course  I have reviewed the triage vital signs and the nursing notes.  Pertinent labs & imaging results that were available during my care of the patient were reviewed by me and considered in my medical decision making (see chart for details).    84 year old male with history of dementia, DM, CKD, residing in a nursing home comes in with family members for 10-12-day history of URI symptoms.  He has had productive cough, rhinorrhea, nasal congestion, loss of appetite.  Otherwise acting as normal, without chest pain, shortness of breath, dizziness, syncope.  On exam, but he is afebrile, nontoxic in appearance, talking in full sentences.  He is slightly hypotensive at 98/70.  Lungs are clear to auscultation bilaterally without adventitious lung  sounds.  Strength normal and equal bilaterally.  Denies dizziness, weakness, syncope.  Will treat with azithromycin for bronchitis.  Other symptomatic treatment discussed.  Discussed hypertension could be due to decreased fluid and food intake.  Patient residing in nursing home, and has residential MD/nursing staff  who can monitor blood pressure.  Advised patient push fluids and keep close monitoring of blood pressure.  Strict return precautions given.  Required paperwork filled out.  Family member expresses understanding and agrees to plan.  Final Clinical Impressions(s) / UC Diagnoses   Final diagnoses:  Bronchitis    ED Discharge Orders        Ordered    azithromycin (ZITHROMAX) 250 MG tablet  Daily     09/30/17 1154    fluticasone (FLONASE) 50 MCG/ACT nasal spray  Daily     09/30/17 1154    ipratropium (ATROVENT) 0.06 % nasal spray  4 times daily     09/30/17 1154        [address] [place] 09/30/17 1202

## 2017-10-03 DIAGNOSIS — I10 Essential (primary) hypertension: Secondary | ICD-10-CM | POA: Diagnosis not present

## 2017-10-03 DIAGNOSIS — M6281 Muscle weakness (generalized): Secondary | ICD-10-CM | POA: Diagnosis not present

## 2017-10-04 DIAGNOSIS — M6281 Muscle weakness (generalized): Secondary | ICD-10-CM | POA: Diagnosis not present

## 2017-10-04 DIAGNOSIS — I10 Essential (primary) hypertension: Secondary | ICD-10-CM | POA: Diagnosis not present

## 2017-10-05 DIAGNOSIS — I10 Essential (primary) hypertension: Secondary | ICD-10-CM | POA: Diagnosis not present

## 2017-10-05 DIAGNOSIS — M6281 Muscle weakness (generalized): Secondary | ICD-10-CM | POA: Diagnosis not present

## 2017-10-06 DIAGNOSIS — M6281 Muscle weakness (generalized): Secondary | ICD-10-CM | POA: Diagnosis not present

## 2017-10-06 DIAGNOSIS — I10 Essential (primary) hypertension: Secondary | ICD-10-CM | POA: Diagnosis not present

## 2017-11-09 DIAGNOSIS — M6281 Muscle weakness (generalized): Secondary | ICD-10-CM | POA: Diagnosis not present

## 2017-11-09 DIAGNOSIS — F0390 Unspecified dementia without behavioral disturbance: Secondary | ICD-10-CM | POA: Diagnosis not present

## 2017-11-09 DIAGNOSIS — M25811 Other specified joint disorders, right shoulder: Secondary | ICD-10-CM | POA: Diagnosis not present

## 2017-11-09 DIAGNOSIS — K5900 Constipation, unspecified: Secondary | ICD-10-CM | POA: Diagnosis not present

## 2017-11-09 DIAGNOSIS — E119 Type 2 diabetes mellitus without complications: Secondary | ICD-10-CM | POA: Diagnosis not present

## 2017-11-09 DIAGNOSIS — E785 Hyperlipidemia, unspecified: Secondary | ICD-10-CM | POA: Diagnosis not present

## 2017-11-09 DIAGNOSIS — I10 Essential (primary) hypertension: Secondary | ICD-10-CM | POA: Diagnosis not present

## 2017-11-10 ENCOUNTER — Encounter: Payer: Self-pay | Admitting: *Deleted

## 2017-11-10 DIAGNOSIS — I509 Heart failure, unspecified: Secondary | ICD-10-CM | POA: Diagnosis not present

## 2017-11-10 DIAGNOSIS — E119 Type 2 diabetes mellitus without complications: Secondary | ICD-10-CM | POA: Diagnosis not present

## 2017-11-10 DIAGNOSIS — M2550 Pain in unspecified joint: Secondary | ICD-10-CM | POA: Diagnosis not present

## 2017-11-10 DIAGNOSIS — M2560 Stiffness of unspecified joint, not elsewhere classified: Secondary | ICD-10-CM | POA: Diagnosis not present

## 2017-11-10 DIAGNOSIS — M25511 Pain in right shoulder: Secondary | ICD-10-CM | POA: Diagnosis not present

## 2017-11-16 DIAGNOSIS — I10 Essential (primary) hypertension: Secondary | ICD-10-CM | POA: Diagnosis not present

## 2017-11-16 DIAGNOSIS — M6281 Muscle weakness (generalized): Secondary | ICD-10-CM | POA: Diagnosis not present

## 2017-11-24 DIAGNOSIS — I10 Essential (primary) hypertension: Secondary | ICD-10-CM | POA: Diagnosis not present

## 2017-11-24 DIAGNOSIS — M6281 Muscle weakness (generalized): Secondary | ICD-10-CM | POA: Diagnosis not present

## 2017-11-25 DIAGNOSIS — M6281 Muscle weakness (generalized): Secondary | ICD-10-CM | POA: Diagnosis not present

## 2017-11-25 DIAGNOSIS — I10 Essential (primary) hypertension: Secondary | ICD-10-CM | POA: Diagnosis not present

## 2017-11-26 DIAGNOSIS — M6281 Muscle weakness (generalized): Secondary | ICD-10-CM | POA: Diagnosis not present

## 2017-11-26 DIAGNOSIS — I10 Essential (primary) hypertension: Secondary | ICD-10-CM | POA: Diagnosis not present

## 2017-11-28 DIAGNOSIS — M6281 Muscle weakness (generalized): Secondary | ICD-10-CM | POA: Diagnosis not present

## 2017-11-28 DIAGNOSIS — I10 Essential (primary) hypertension: Secondary | ICD-10-CM | POA: Diagnosis not present

## 2017-11-29 DIAGNOSIS — I10 Essential (primary) hypertension: Secondary | ICD-10-CM | POA: Diagnosis not present

## 2017-11-29 DIAGNOSIS — M6281 Muscle weakness (generalized): Secondary | ICD-10-CM | POA: Diagnosis not present

## 2017-11-30 DIAGNOSIS — R69 Illness, unspecified: Secondary | ICD-10-CM | POA: Diagnosis not present

## 2017-11-30 DIAGNOSIS — I10 Essential (primary) hypertension: Secondary | ICD-10-CM | POA: Diagnosis not present

## 2017-11-30 DIAGNOSIS — M6281 Muscle weakness (generalized): Secondary | ICD-10-CM | POA: Diagnosis not present

## 2017-12-01 DIAGNOSIS — I10 Essential (primary) hypertension: Secondary | ICD-10-CM | POA: Diagnosis not present

## 2017-12-01 DIAGNOSIS — M6281 Muscle weakness (generalized): Secondary | ICD-10-CM | POA: Diagnosis not present

## 2017-12-02 DIAGNOSIS — I10 Essential (primary) hypertension: Secondary | ICD-10-CM | POA: Diagnosis not present

## 2017-12-02 DIAGNOSIS — M6281 Muscle weakness (generalized): Secondary | ICD-10-CM | POA: Diagnosis not present

## 2017-12-05 DIAGNOSIS — M6281 Muscle weakness (generalized): Secondary | ICD-10-CM | POA: Diagnosis not present

## 2017-12-05 DIAGNOSIS — I10 Essential (primary) hypertension: Secondary | ICD-10-CM | POA: Diagnosis not present

## 2017-12-06 ENCOUNTER — Ambulatory Visit (INDEPENDENT_AMBULATORY_CARE_PROVIDER_SITE_OTHER): Payer: Medicare HMO | Admitting: General Surgery

## 2017-12-06 VITALS — BP 140/80 | HR 88 | Resp 18 | Ht 69.0 in | Wt 250.0 lb

## 2017-12-06 DIAGNOSIS — R1031 Right lower quadrant pain: Secondary | ICD-10-CM | POA: Diagnosis not present

## 2017-12-06 DIAGNOSIS — I10 Essential (primary) hypertension: Secondary | ICD-10-CM | POA: Diagnosis not present

## 2017-12-06 DIAGNOSIS — M6281 Muscle weakness (generalized): Secondary | ICD-10-CM | POA: Diagnosis not present

## 2017-12-06 NOTE — Progress Notes (Signed)
Patient ID: Daniel [name], male   DOB: Jun 24, 1928, 84 y.o.   MRN: [id number]  Chief Complaint  Patient presents with  . Pain    HPI Daniel [name] is a 84 y.o. male here today for a evauiation of right groin  pain. He states the pain started about 3 weeks. No nauseas or vomiting. Moves his bowels every other day.  The patient is a retried Administrator.  He suffered several falls and has undergone surgery on the lower extremities and his back which precludes relation. HPI  Past Medical History:  Diagnosis Date  . Arthritis   . Chronic kidney disease   . Dementia   . Diabetes mellitus without complication (HCC)    Type 2  . Hyperlipidemia   . Hypertension   . Low back pain   . Muscle weakness (generalized)   . Other fracture of t5-T6 vertebra, subsequent encounter for fracture with routine healing   . Polyosteoarthritis   . Renal disorder   . Type 2 diabetes mellitus ([place])   . Unspecified fracture of t5-T6 vertebra, subsequent encounter for fracture with routine healing     Past Surgical History:  Procedure Laterality Date  . BACK SURGERY    . CHOLECYSTECTOMY    . HERNIA REPAIR    . KNEE SURGERY    . teeth pulled  Bilateral 08/13/14   teeth pulled and bottom gum removed    Family History  Problem Relation Age of Onset  . Hypertension Father     Social History Social History   Tobacco Use  . Smoking status: Never Smoker  . Smokeless tobacco: Never Used  Substance Use Topics  . Alcohol use: No  . Drug use: Not on file    Allergies  Allergen Reactions  . Oxycodone Nausea And Vomiting  . Codeine     Current Outpatient Medications  Medication Sig Dispense Refill  . amLODipine (NORVASC) 10 MG tablet Take 10 mg by mouth daily.    [name] atorvastatin (LIPITOR) 10 MG tablet Take 5 mg by mouth daily.    . bisacodyl (DULCOLAX) 5 MG EC tablet Take 5 mg by mouth 2 (two) times daily as needed for moderate constipation.    . bumetanide (BUMEX) 0.5 MG tablet Take 0.5 mg by  mouth daily.    . Cholecalciferol 50000 units capsule Take 50,000 Units by mouth daily.    . diclofenac sodium (VOLTAREN) 1 % GEL Apply 4 g topically every 6 (six) hours as needed.    . fluticasone (FLONASE) 50 MCG/ACT nasal spray Place 2 sprays into both nostrils daily. 1 g 0  . gabapentin (NEURONTIN) 100 MG capsule Take 100 mg by mouth 3 (three) times daily.    [name] guaifenesin (GUIATUSS) 100 MG/5ML syrup Take 100 mg by mouth every 6 (six) hours as needed for cough.    . Hypromellose (NATURAL BALANCE TEARS) 0.4 % SOLN Apply 1 drop to eye every 6 (six) hours as needed (for dry eyes).    [name] ibuprofen (ADVIL,MOTRIN) 400 MG tablet Take 1 tablet (400 mg total) by mouth every 6 (six) hours as needed for headache or moderate pain. 30 tablet 0  . Insulin Glargine (TOUJEO SOLOSTAR) 300 UNIT/ML SOPN Inject 45 Units into the skin daily. 5 pen 0  . insulin lispro (HUMALOG) 100 UNIT/ML injection Inject into the skin 3 (three) times daily before meals. Inject as per sliding scale  250-350=2 units 351-450=4 units >451call optum    . ipratropium (ATROVENT) 0.06 % nasal spray Place 2  sprays into both nostrils 4 (four) times daily. 15 mL 0  . losartan (COZAAR) 25 MG tablet Take 25 mg by mouth daily.    [name] lubiprostone (AMITIZA) 24 MCG capsule Take 24 mcg by mouth 2 (two) times daily with a meal.    . magnesium oxide (MAG-OX) 400 MG tablet Take 400 mg by mouth daily.    . memantine (NAMENDA XR) 28 MG CP24 24 hr capsule Take 28 mg by mouth daily.    . sitaGLIPtin (JANUVIA) 50 MG tablet Take 50 mg by mouth daily. Reported on 07/30/2015    . traMADol (ULTRAM) 50 MG tablet Take 1 tablet (50 mg total) by mouth every 6 (six) hours. 120 tablet 0   No current facility-administered medications for this visit.     Review of Systems Review of Systems  Constitutional: Negative.   Respiratory: Negative.   Cardiovascular: Negative.     Blood pressure 140/80, pulse 88, resp. rate 18, height 5\' 9"  (1.753 m), weight 250 lb  (113.4 kg).  Physical Exam Physical Exam  Constitutional: He is oriented to person, place, and time. He appears well-developed and well-nourished.  Abdominal: A hernia (umbilical hernia) is present. Hernia confirmed negative in the right inguinal area and confirmed negative in the left inguinal area.  Genitourinary:     Neurological: He is alert and oriented to person, place, and time.  Skin: Skin is warm and dry.    Data Reviewed PCP notes from [facility].    Assessment    Vague right lower quadrant discomfort without clear evidence of intra-abdominal or abdominal wall dysfunction.    Plan  No indication for operative intervention or imaging at this time.  The patient is limited to bed to chair due to lower extremity injuries.  Unless a clinically evident hernia was noted and was severely symptomatic, the patient's medical comorbidities preclude consideration for operative repair.  Patient to return as needed. The patient is aware to call back for any questions or concerns.   HPI, Physical Exam, Assessment and Plan have been scribed under the direction and in the presence of Hervey Ard, MD.  Gaspar Cola, CMA  I have completed the exam and reviewed the above documentation for accuracy and completeness.  I agree with the above.  Haematologist has been used and any errors in dictation or transcription are unintentional.  Hervey Ard, M.D., F.A.C.S.  Forest Gleason Natalio Salois 12/06/2017, 2:54 PM

## 2017-12-06 NOTE — Patient Instructions (Signed)
Return as needed.The patient is aware to call back for any questions or concerns.  

## 2017-12-07 DIAGNOSIS — M6281 Muscle weakness (generalized): Secondary | ICD-10-CM | POA: Diagnosis not present

## 2017-12-07 DIAGNOSIS — I10 Essential (primary) hypertension: Secondary | ICD-10-CM | POA: Diagnosis not present

## 2017-12-08 DIAGNOSIS — I10 Essential (primary) hypertension: Secondary | ICD-10-CM | POA: Diagnosis not present

## 2017-12-08 DIAGNOSIS — N08 Glomerular disorders in diseases classified elsewhere: Secondary | ICD-10-CM | POA: Diagnosis not present

## 2017-12-08 DIAGNOSIS — E1122 Type 2 diabetes mellitus with diabetic chronic kidney disease: Secondary | ICD-10-CM | POA: Diagnosis not present

## 2017-12-08 DIAGNOSIS — Z794 Long term (current) use of insulin: Secondary | ICD-10-CM | POA: Diagnosis not present

## 2017-12-08 DIAGNOSIS — M6281 Muscle weakness (generalized): Secondary | ICD-10-CM | POA: Diagnosis not present

## 2017-12-08 DIAGNOSIS — N183 Chronic kidney disease, stage 3 (moderate): Secondary | ICD-10-CM | POA: Diagnosis not present

## 2017-12-09 DIAGNOSIS — I10 Essential (primary) hypertension: Secondary | ICD-10-CM | POA: Diagnosis not present

## 2017-12-09 DIAGNOSIS — M6281 Muscle weakness (generalized): Secondary | ICD-10-CM | POA: Diagnosis not present

## 2017-12-12 DIAGNOSIS — B351 Tinea unguium: Secondary | ICD-10-CM | POA: Diagnosis not present

## 2017-12-12 DIAGNOSIS — E1051 Type 1 diabetes mellitus with diabetic peripheral angiopathy without gangrene: Secondary | ICD-10-CM | POA: Diagnosis not present

## 2017-12-12 DIAGNOSIS — I10 Essential (primary) hypertension: Secondary | ICD-10-CM | POA: Diagnosis not present

## 2017-12-12 DIAGNOSIS — M6281 Muscle weakness (generalized): Secondary | ICD-10-CM | POA: Diagnosis not present

## 2017-12-12 DIAGNOSIS — R262 Difficulty in walking, not elsewhere classified: Secondary | ICD-10-CM | POA: Diagnosis not present

## 2017-12-13 DIAGNOSIS — M6281 Muscle weakness (generalized): Secondary | ICD-10-CM | POA: Diagnosis not present

## 2017-12-13 DIAGNOSIS — I10 Essential (primary) hypertension: Secondary | ICD-10-CM | POA: Diagnosis not present

## 2017-12-14 DIAGNOSIS — M6281 Muscle weakness (generalized): Secondary | ICD-10-CM | POA: Diagnosis not present

## 2017-12-14 DIAGNOSIS — I10 Essential (primary) hypertension: Secondary | ICD-10-CM | POA: Diagnosis not present

## 2017-12-15 DIAGNOSIS — I10 Essential (primary) hypertension: Secondary | ICD-10-CM | POA: Diagnosis not present

## 2017-12-15 DIAGNOSIS — M6281 Muscle weakness (generalized): Secondary | ICD-10-CM | POA: Diagnosis not present

## 2017-12-16 DIAGNOSIS — I10 Essential (primary) hypertension: Secondary | ICD-10-CM | POA: Diagnosis not present

## 2017-12-16 DIAGNOSIS — M6281 Muscle weakness (generalized): Secondary | ICD-10-CM | POA: Diagnosis not present

## 2017-12-19 DIAGNOSIS — I10 Essential (primary) hypertension: Secondary | ICD-10-CM | POA: Diagnosis not present

## 2017-12-19 DIAGNOSIS — M6281 Muscle weakness (generalized): Secondary | ICD-10-CM | POA: Diagnosis not present

## 2017-12-20 DIAGNOSIS — M6281 Muscle weakness (generalized): Secondary | ICD-10-CM | POA: Diagnosis not present

## 2017-12-20 DIAGNOSIS — I10 Essential (primary) hypertension: Secondary | ICD-10-CM | POA: Diagnosis not present

## 2017-12-21 DIAGNOSIS — I10 Essential (primary) hypertension: Secondary | ICD-10-CM | POA: Diagnosis not present

## 2017-12-21 DIAGNOSIS — M6281 Muscle weakness (generalized): Secondary | ICD-10-CM | POA: Diagnosis not present

## 2017-12-22 DIAGNOSIS — I10 Essential (primary) hypertension: Secondary | ICD-10-CM | POA: Diagnosis not present

## 2017-12-22 DIAGNOSIS — M6281 Muscle weakness (generalized): Secondary | ICD-10-CM | POA: Diagnosis not present

## 2017-12-23 DIAGNOSIS — M6281 Muscle weakness (generalized): Secondary | ICD-10-CM | POA: Diagnosis not present

## 2017-12-23 DIAGNOSIS — I10 Essential (primary) hypertension: Secondary | ICD-10-CM | POA: Diagnosis not present

## 2017-12-26 DIAGNOSIS — M6281 Muscle weakness (generalized): Secondary | ICD-10-CM | POA: Diagnosis not present

## 2017-12-26 DIAGNOSIS — I10 Essential (primary) hypertension: Secondary | ICD-10-CM | POA: Diagnosis not present

## 2017-12-27 DIAGNOSIS — I10 Essential (primary) hypertension: Secondary | ICD-10-CM | POA: Diagnosis not present

## 2017-12-27 DIAGNOSIS — M6281 Muscle weakness (generalized): Secondary | ICD-10-CM | POA: Diagnosis not present

## 2017-12-28 DIAGNOSIS — M6281 Muscle weakness (generalized): Secondary | ICD-10-CM | POA: Diagnosis not present

## 2017-12-28 DIAGNOSIS — I10 Essential (primary) hypertension: Secondary | ICD-10-CM | POA: Diagnosis not present

## 2018-01-10 DIAGNOSIS — F0390 Unspecified dementia without behavioral disturbance: Secondary | ICD-10-CM | POA: Diagnosis not present

## 2018-01-10 DIAGNOSIS — E119 Type 2 diabetes mellitus without complications: Secondary | ICD-10-CM | POA: Diagnosis not present

## 2018-01-10 DIAGNOSIS — I10 Essential (primary) hypertension: Secondary | ICD-10-CM | POA: Diagnosis not present

## 2018-01-10 DIAGNOSIS — K5900 Constipation, unspecified: Secondary | ICD-10-CM | POA: Diagnosis not present

## 2018-01-10 DIAGNOSIS — E785 Hyperlipidemia, unspecified: Secondary | ICD-10-CM | POA: Diagnosis not present

## 2018-01-10 DIAGNOSIS — M25511 Pain in right shoulder: Secondary | ICD-10-CM | POA: Diagnosis not present

## 2018-01-10 DIAGNOSIS — M6281 Muscle weakness (generalized): Secondary | ICD-10-CM | POA: Diagnosis not present

## 2018-01-11 DIAGNOSIS — I10 Essential (primary) hypertension: Secondary | ICD-10-CM | POA: Diagnosis not present

## 2018-01-11 DIAGNOSIS — M6281 Muscle weakness (generalized): Secondary | ICD-10-CM | POA: Diagnosis not present

## 2018-01-12 DIAGNOSIS — M6281 Muscle weakness (generalized): Secondary | ICD-10-CM | POA: Diagnosis not present

## 2018-01-12 DIAGNOSIS — I10 Essential (primary) hypertension: Secondary | ICD-10-CM | POA: Diagnosis not present

## 2018-01-13 DIAGNOSIS — M6281 Muscle weakness (generalized): Secondary | ICD-10-CM | POA: Diagnosis not present

## 2018-01-13 DIAGNOSIS — I10 Essential (primary) hypertension: Secondary | ICD-10-CM | POA: Diagnosis not present

## 2018-01-16 DIAGNOSIS — I10 Essential (primary) hypertension: Secondary | ICD-10-CM | POA: Diagnosis not present

## 2018-01-16 DIAGNOSIS — M6281 Muscle weakness (generalized): Secondary | ICD-10-CM | POA: Diagnosis not present

## 2018-01-17 DIAGNOSIS — I10 Essential (primary) hypertension: Secondary | ICD-10-CM | POA: Diagnosis not present

## 2018-01-17 DIAGNOSIS — M6281 Muscle weakness (generalized): Secondary | ICD-10-CM | POA: Diagnosis not present

## 2018-01-18 DIAGNOSIS — M6281 Muscle weakness (generalized): Secondary | ICD-10-CM | POA: Diagnosis not present

## 2018-01-18 DIAGNOSIS — I10 Essential (primary) hypertension: Secondary | ICD-10-CM | POA: Diagnosis not present

## 2018-01-19 DIAGNOSIS — M6281 Muscle weakness (generalized): Secondary | ICD-10-CM | POA: Diagnosis not present

## 2018-01-19 DIAGNOSIS — I10 Essential (primary) hypertension: Secondary | ICD-10-CM | POA: Diagnosis not present

## 2018-01-20 DIAGNOSIS — I10 Essential (primary) hypertension: Secondary | ICD-10-CM | POA: Diagnosis not present

## 2018-01-20 DIAGNOSIS — M6281 Muscle weakness (generalized): Secondary | ICD-10-CM | POA: Diagnosis not present

## 2018-01-23 DIAGNOSIS — M6281 Muscle weakness (generalized): Secondary | ICD-10-CM | POA: Diagnosis not present

## 2018-01-23 DIAGNOSIS — I10 Essential (primary) hypertension: Secondary | ICD-10-CM | POA: Diagnosis not present

## 2018-01-24 DIAGNOSIS — M6281 Muscle weakness (generalized): Secondary | ICD-10-CM | POA: Diagnosis not present

## 2018-01-24 DIAGNOSIS — I10 Essential (primary) hypertension: Secondary | ICD-10-CM | POA: Diagnosis not present

## 2018-01-25 DIAGNOSIS — M6281 Muscle weakness (generalized): Secondary | ICD-10-CM | POA: Diagnosis not present

## 2018-01-25 DIAGNOSIS — I10 Essential (primary) hypertension: Secondary | ICD-10-CM | POA: Diagnosis not present

## 2018-01-26 DIAGNOSIS — M6281 Muscle weakness (generalized): Secondary | ICD-10-CM | POA: Diagnosis not present

## 2018-01-26 DIAGNOSIS — I10 Essential (primary) hypertension: Secondary | ICD-10-CM | POA: Diagnosis not present

## 2018-01-27 DIAGNOSIS — I10 Essential (primary) hypertension: Secondary | ICD-10-CM | POA: Diagnosis not present

## 2018-01-27 DIAGNOSIS — M6281 Muscle weakness (generalized): Secondary | ICD-10-CM | POA: Diagnosis not present

## 2018-01-30 DIAGNOSIS — M2450 Contracture, unspecified joint: Secondary | ICD-10-CM | POA: Diagnosis not present

## 2018-01-30 DIAGNOSIS — M6281 Muscle weakness (generalized): Secondary | ICD-10-CM | POA: Diagnosis not present

## 2018-01-31 DIAGNOSIS — M2450 Contracture, unspecified joint: Secondary | ICD-10-CM | POA: Diagnosis not present

## 2018-01-31 DIAGNOSIS — M6281 Muscle weakness (generalized): Secondary | ICD-10-CM | POA: Diagnosis not present

## 2018-02-01 DIAGNOSIS — M6281 Muscle weakness (generalized): Secondary | ICD-10-CM | POA: Diagnosis not present

## 2018-02-01 DIAGNOSIS — M19011 Primary osteoarthritis, right shoulder: Secondary | ICD-10-CM | POA: Diagnosis not present

## 2018-02-01 DIAGNOSIS — M25511 Pain in right shoulder: Secondary | ICD-10-CM | POA: Diagnosis not present

## 2018-02-01 DIAGNOSIS — G8929 Other chronic pain: Secondary | ICD-10-CM | POA: Diagnosis not present

## 2018-02-01 DIAGNOSIS — M2450 Contracture, unspecified joint: Secondary | ICD-10-CM | POA: Diagnosis not present

## 2018-02-01 DIAGNOSIS — M25611 Stiffness of right shoulder, not elsewhere classified: Secondary | ICD-10-CM | POA: Diagnosis not present

## 2018-02-02 DIAGNOSIS — M2450 Contracture, unspecified joint: Secondary | ICD-10-CM | POA: Diagnosis not present

## 2018-02-02 DIAGNOSIS — M6281 Muscle weakness (generalized): Secondary | ICD-10-CM | POA: Diagnosis not present

## 2018-02-03 DIAGNOSIS — M6281 Muscle weakness (generalized): Secondary | ICD-10-CM | POA: Diagnosis not present

## 2018-02-03 DIAGNOSIS — M2450 Contracture, unspecified joint: Secondary | ICD-10-CM | POA: Diagnosis not present

## 2018-02-06 DIAGNOSIS — M6281 Muscle weakness (generalized): Secondary | ICD-10-CM | POA: Diagnosis not present

## 2018-02-06 DIAGNOSIS — M2450 Contracture, unspecified joint: Secondary | ICD-10-CM | POA: Diagnosis not present

## 2018-02-07 DIAGNOSIS — M6281 Muscle weakness (generalized): Secondary | ICD-10-CM | POA: Diagnosis not present

## 2018-02-07 DIAGNOSIS — M2450 Contracture, unspecified joint: Secondary | ICD-10-CM | POA: Diagnosis not present

## 2018-02-08 DIAGNOSIS — M6281 Muscle weakness (generalized): Secondary | ICD-10-CM | POA: Diagnosis not present

## 2018-02-08 DIAGNOSIS — M2450 Contracture, unspecified joint: Secondary | ICD-10-CM | POA: Diagnosis not present

## 2018-02-09 DIAGNOSIS — M6281 Muscle weakness (generalized): Secondary | ICD-10-CM | POA: Diagnosis not present

## 2018-02-09 DIAGNOSIS — M2450 Contracture, unspecified joint: Secondary | ICD-10-CM | POA: Diagnosis not present

## 2018-02-10 DIAGNOSIS — M2450 Contracture, unspecified joint: Secondary | ICD-10-CM | POA: Diagnosis not present

## 2018-02-10 DIAGNOSIS — M6281 Muscle weakness (generalized): Secondary | ICD-10-CM | POA: Diagnosis not present

## 2018-02-13 DIAGNOSIS — M2450 Contracture, unspecified joint: Secondary | ICD-10-CM | POA: Diagnosis not present

## 2018-02-13 DIAGNOSIS — M6281 Muscle weakness (generalized): Secondary | ICD-10-CM | POA: Diagnosis not present

## 2018-02-14 DIAGNOSIS — M2450 Contracture, unspecified joint: Secondary | ICD-10-CM | POA: Diagnosis not present

## 2018-02-14 DIAGNOSIS — M6281 Muscle weakness (generalized): Secondary | ICD-10-CM | POA: Diagnosis not present

## 2018-02-15 DIAGNOSIS — M2450 Contracture, unspecified joint: Secondary | ICD-10-CM | POA: Diagnosis not present

## 2018-02-15 DIAGNOSIS — M6281 Muscle weakness (generalized): Secondary | ICD-10-CM | POA: Diagnosis not present

## 2018-02-16 DIAGNOSIS — M2450 Contracture, unspecified joint: Secondary | ICD-10-CM | POA: Diagnosis not present

## 2018-02-16 DIAGNOSIS — M6281 Muscle weakness (generalized): Secondary | ICD-10-CM | POA: Diagnosis not present

## 2018-02-17 DIAGNOSIS — M2450 Contracture, unspecified joint: Secondary | ICD-10-CM | POA: Diagnosis not present

## 2018-02-17 DIAGNOSIS — M6281 Muscle weakness (generalized): Secondary | ICD-10-CM | POA: Diagnosis not present

## 2018-02-18 IMAGING — US US EXTREM LOW VENOUS*R*
1 series · 13 of 24 positions shown · non-contrast
Comparison: Right ankle series 3710 hours today.

CLINICAL DATA: 88-year-old male with distal fibular fracture. New
right lower extremity pain and swelling. Initial encounter.



[Series 1: us extrem low venous*right* · 0.07mm/px · 13 of 34 slices shown]
[im 1/34]
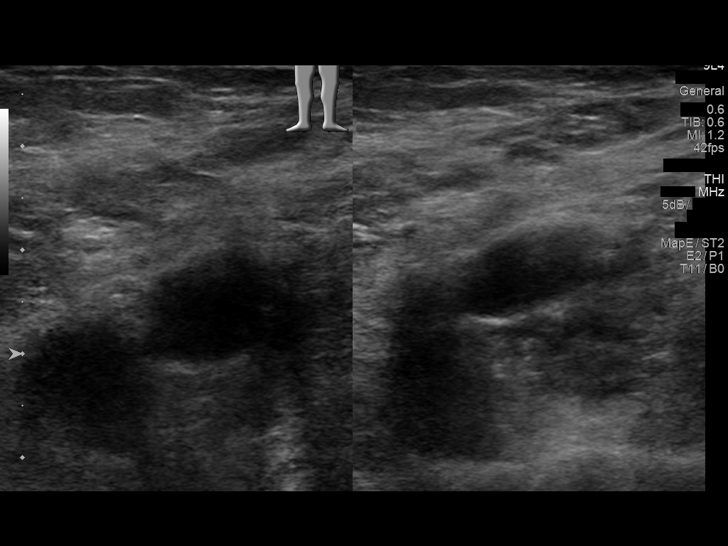
[im 3/34]
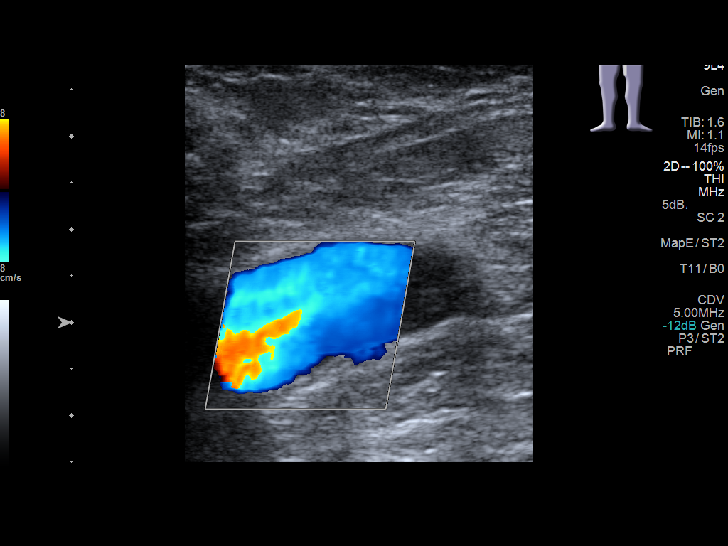
[im 6/34]
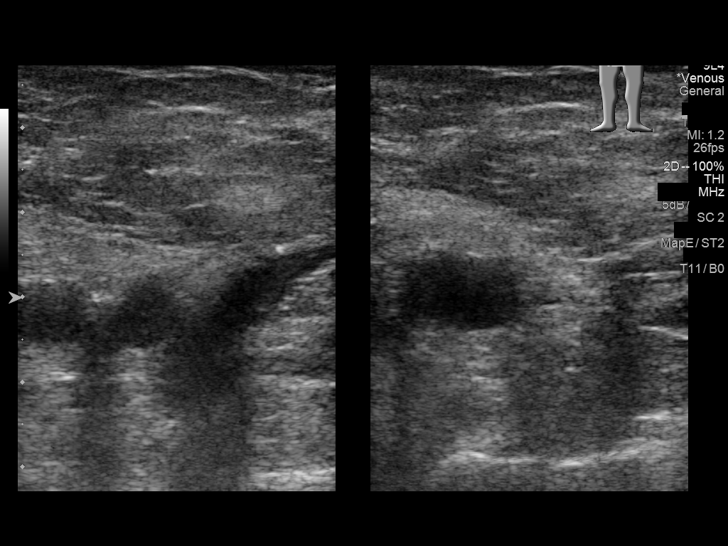
[im 9/34]
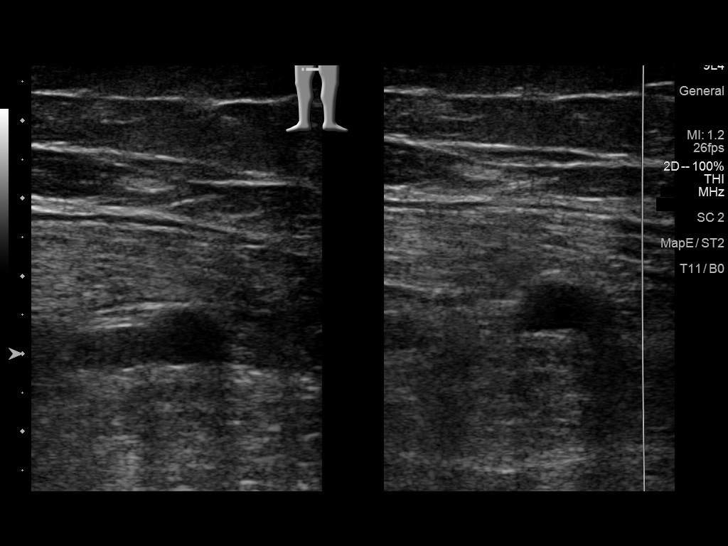
[im 12/34]
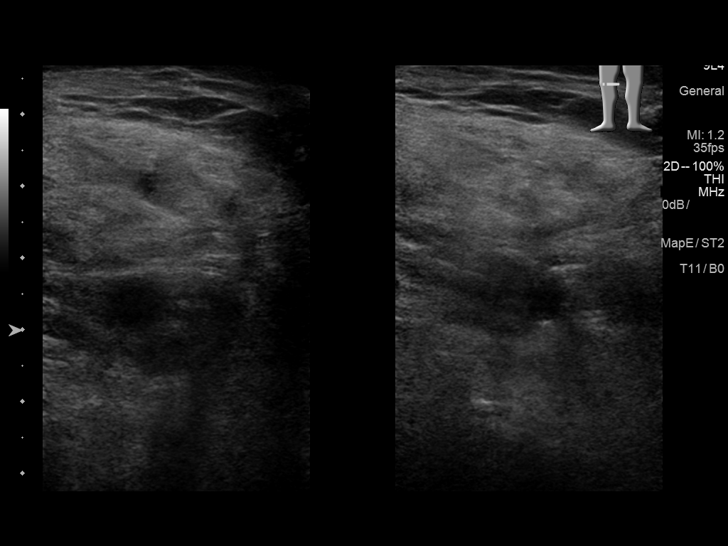
[im 15/34]
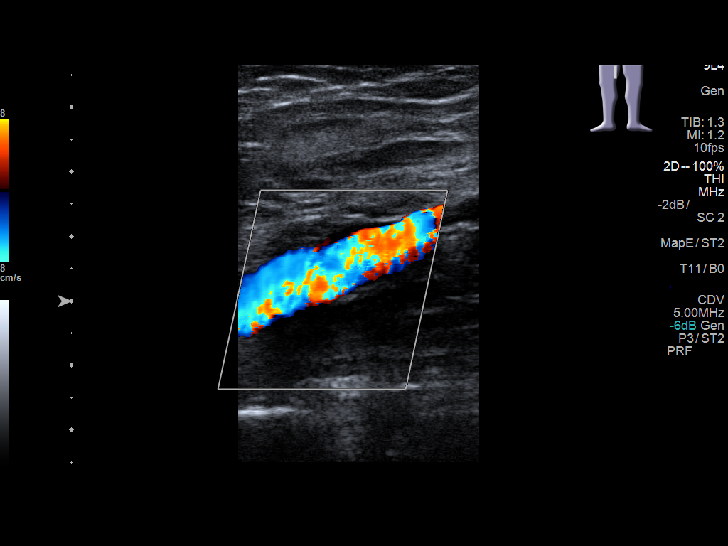
[im 18/34]
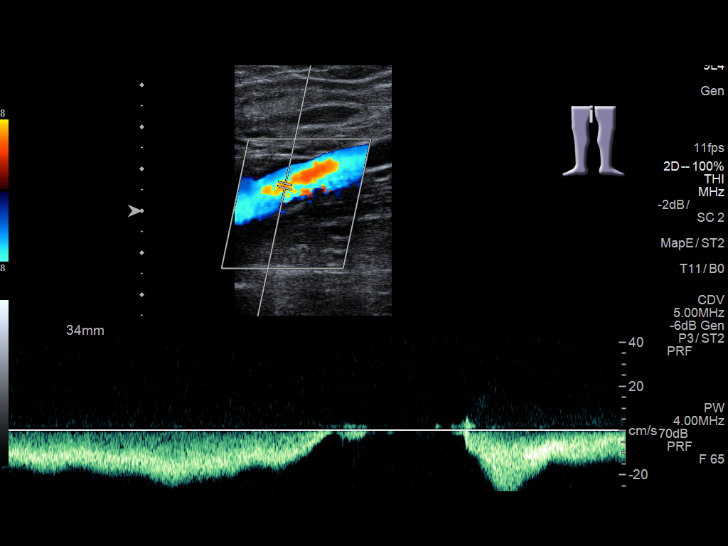
[im 19/34]
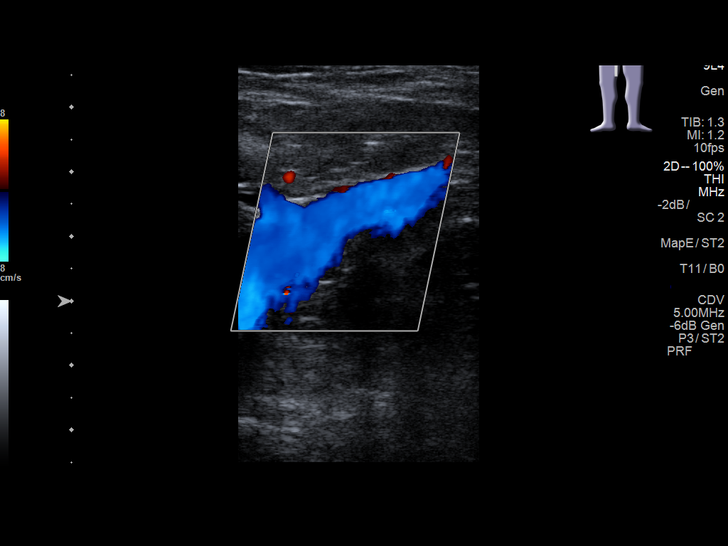
[im 22/34]
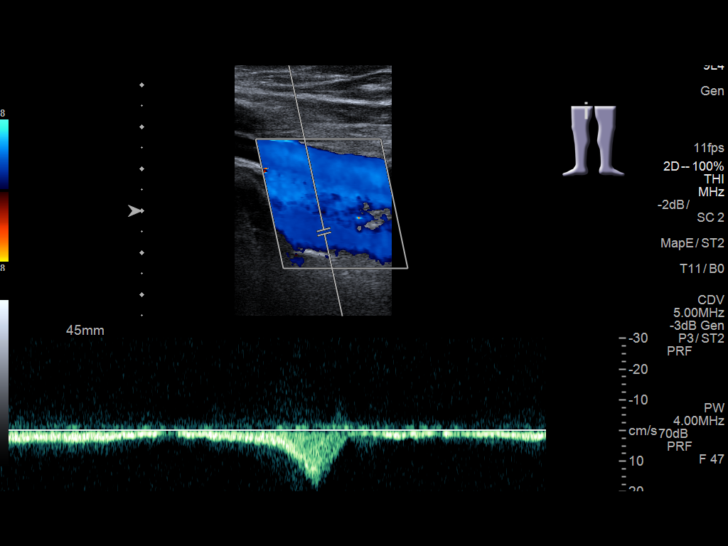
[im 25/34]
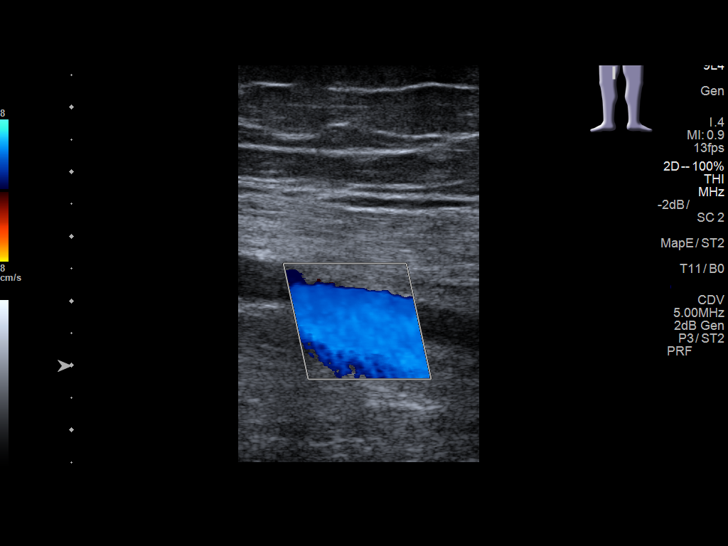
[im 28/34]
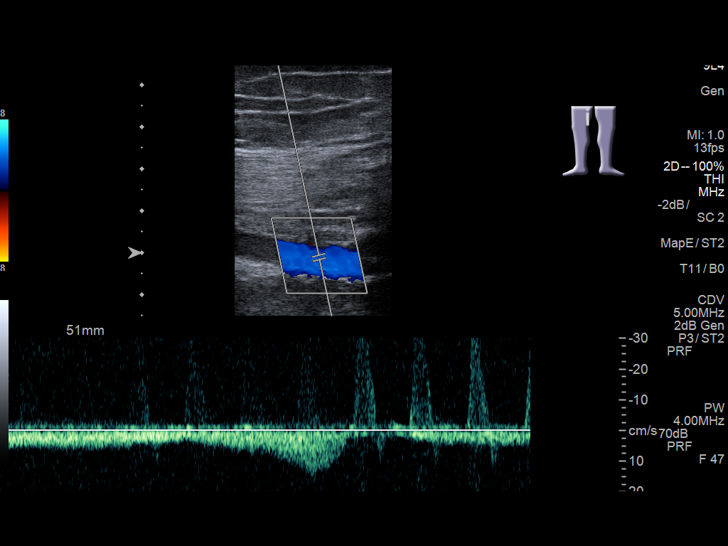
[im 31/34]
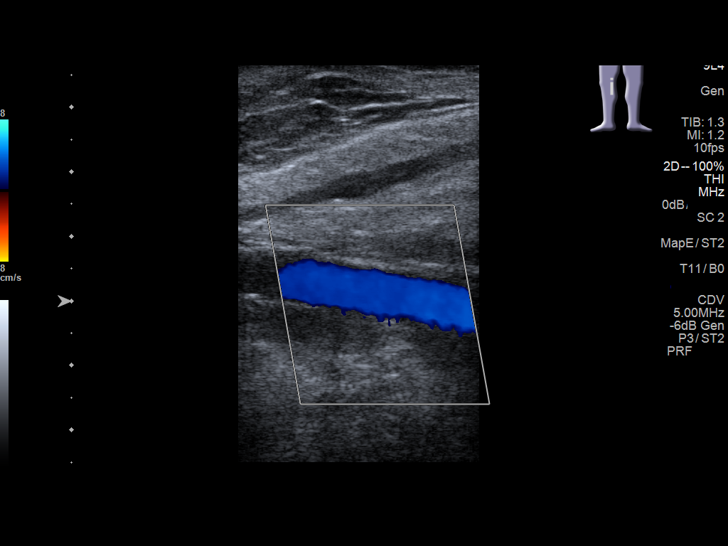
[im 34/34]
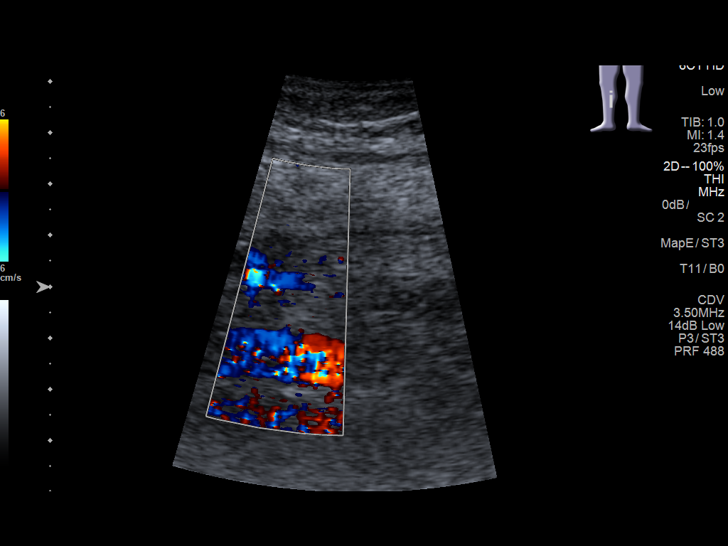

[13 of 24 positions shown; findings below may reference images not displayed]

FINDINGS: Contralateral Common Femoral Vein: Respiratory phasicity is normal
and symmetric with the symptomatic side. No evidence of thrombus.
Normal compressibility.

Common Femoral Vein: No evidence of thrombus. Normal
compressibility, respiratory phasicity and response to augmentation.

Saphenofemoral Junction: No evidence of thrombus. Normal
compressibility and flow on color Doppler imaging.

Profunda Femoral Vein: No evidence of thrombus. Normal
compressibility and flow on color Doppler imaging.

Femoral Vein: No evidence of thrombus. Normal compressibility,
respiratory phasicity and response to augmentation.

Popliteal Vein: No evidence of thrombus. Normal compressibility,
respiratory phasicity and response to augmentation.

Calf Veins: No evidence of thrombus. Normal compressibility and flow
on color Doppler imaging.

Superficial Great Saphenous Vein: No evidence of thrombus. Normal
compressibility and flow on color Doppler imaging.

Venous Reflux:  None.

Other Findings:  None.
IMPRESSION: No evidence of right lower extremity deep venous thrombosis.

## 2018-02-20 DIAGNOSIS — M2450 Contracture, unspecified joint: Secondary | ICD-10-CM | POA: Diagnosis not present

## 2018-02-20 DIAGNOSIS — M6281 Muscle weakness (generalized): Secondary | ICD-10-CM | POA: Diagnosis not present

## 2018-02-21 DIAGNOSIS — M6281 Muscle weakness (generalized): Secondary | ICD-10-CM | POA: Diagnosis not present

## 2018-02-21 DIAGNOSIS — M2450 Contracture, unspecified joint: Secondary | ICD-10-CM | POA: Diagnosis not present

## 2018-02-27 DIAGNOSIS — E1051 Type 1 diabetes mellitus with diabetic peripheral angiopathy without gangrene: Secondary | ICD-10-CM | POA: Diagnosis not present

## 2018-02-27 DIAGNOSIS — B351 Tinea unguium: Secondary | ICD-10-CM | POA: Diagnosis not present

## 2018-03-10 DIAGNOSIS — I129 Hypertensive chronic kidney disease with stage 1 through stage 4 chronic kidney disease, or unspecified chronic kidney disease: Secondary | ICD-10-CM | POA: Diagnosis not present

## 2018-03-10 DIAGNOSIS — N183 Chronic kidney disease, stage 3 (moderate): Secondary | ICD-10-CM | POA: Diagnosis not present

## 2018-03-10 DIAGNOSIS — N08 Glomerular disorders in diseases classified elsewhere: Secondary | ICD-10-CM | POA: Diagnosis not present

## 2018-03-10 DIAGNOSIS — E1122 Type 2 diabetes mellitus with diabetic chronic kidney disease: Secondary | ICD-10-CM | POA: Diagnosis not present

## 2018-03-14 DIAGNOSIS — E785 Hyperlipidemia, unspecified: Secondary | ICD-10-CM | POA: Diagnosis not present

## 2018-03-14 DIAGNOSIS — M6281 Muscle weakness (generalized): Secondary | ICD-10-CM | POA: Diagnosis not present

## 2018-03-14 DIAGNOSIS — E119 Type 2 diabetes mellitus without complications: Secondary | ICD-10-CM | POA: Diagnosis not present

## 2018-03-14 DIAGNOSIS — I10 Essential (primary) hypertension: Secondary | ICD-10-CM | POA: Diagnosis not present

## 2018-03-14 DIAGNOSIS — M25811 Other specified joint disorders, right shoulder: Secondary | ICD-10-CM | POA: Diagnosis not present

## 2018-03-14 DIAGNOSIS — F0390 Unspecified dementia without behavioral disturbance: Secondary | ICD-10-CM | POA: Diagnosis not present

## 2018-03-14 DIAGNOSIS — K5900 Constipation, unspecified: Secondary | ICD-10-CM | POA: Diagnosis not present

## 2018-03-31 DIAGNOSIS — J069 Acute upper respiratory infection, unspecified: Secondary | ICD-10-CM | POA: Diagnosis not present

## 2018-03-31 DIAGNOSIS — R413 Other amnesia: Secondary | ICD-10-CM | POA: Diagnosis not present

## 2018-03-31 DIAGNOSIS — B372 Candidiasis of skin and nail: Secondary | ICD-10-CM | POA: Diagnosis not present

## 2018-04-01 DIAGNOSIS — R531 Weakness: Secondary | ICD-10-CM | POA: Diagnosis not present

## 2018-04-02 DIAGNOSIS — R531 Weakness: Secondary | ICD-10-CM | POA: Diagnosis not present

## 2018-04-11 DIAGNOSIS — E559 Vitamin D deficiency, unspecified: Secondary | ICD-10-CM | POA: Diagnosis not present

## 2018-05-11 DIAGNOSIS — I10 Essential (primary) hypertension: Secondary | ICD-10-CM | POA: Diagnosis not present

## 2018-05-11 DIAGNOSIS — D649 Anemia, unspecified: Secondary | ICD-10-CM | POA: Diagnosis not present

## 2018-05-11 DIAGNOSIS — E785 Hyperlipidemia, unspecified: Secondary | ICD-10-CM | POA: Diagnosis not present

## 2018-05-11 DIAGNOSIS — M4800 Spinal stenosis, site unspecified: Secondary | ICD-10-CM | POA: Diagnosis not present

## 2018-05-11 DIAGNOSIS — M5489 Other dorsalgia: Secondary | ICD-10-CM | POA: Diagnosis not present

## 2018-05-11 DIAGNOSIS — E119 Type 2 diabetes mellitus without complications: Secondary | ICD-10-CM | POA: Diagnosis not present

## 2018-05-11 DIAGNOSIS — K5900 Constipation, unspecified: Secondary | ICD-10-CM | POA: Diagnosis not present

## 2018-05-11 DIAGNOSIS — F0390 Unspecified dementia without behavioral disturbance: Secondary | ICD-10-CM | POA: Diagnosis not present

## 2018-06-02 DIAGNOSIS — I739 Peripheral vascular disease, unspecified: Secondary | ICD-10-CM | POA: Diagnosis not present

## 2018-06-02 DIAGNOSIS — R600 Localized edema: Secondary | ICD-10-CM | POA: Diagnosis not present

## 2018-06-02 DIAGNOSIS — L603 Nail dystrophy: Secondary | ICD-10-CM | POA: Diagnosis not present

## 2018-06-02 DIAGNOSIS — B351 Tinea unguium: Secondary | ICD-10-CM | POA: Diagnosis not present

## 2018-06-02 DIAGNOSIS — E1051 Type 1 diabetes mellitus with diabetic peripheral angiopathy without gangrene: Secondary | ICD-10-CM | POA: Diagnosis not present

## 2018-06-02 DIAGNOSIS — R262 Difficulty in walking, not elsewhere classified: Secondary | ICD-10-CM | POA: Diagnosis not present

## 2018-06-05 ENCOUNTER — Inpatient Hospital Stay
Admission: EM | Admit: 2018-06-05 | Discharge: 2018-06-09 | DRG: 291 | Disposition: A | Discharge status: Skilled Nursing Facility | Payer: Medicare HMO | Attending: Internal Medicine | Admitting: Internal Medicine

## 2018-06-05 ENCOUNTER — Emergency Department: Payer: Medicare HMO

## 2018-06-05 ENCOUNTER — Encounter: Payer: Self-pay | Admitting: Emergency Medicine

## 2018-06-05 ENCOUNTER — Other Ambulatory Visit: Payer: Self-pay

## 2018-06-05 DIAGNOSIS — M159 Polyosteoarthritis, unspecified: Secondary | ICD-10-CM | POA: Diagnosis present

## 2018-06-05 DIAGNOSIS — R0600 Dyspnea, unspecified: Secondary | ICD-10-CM | POA: Diagnosis not present

## 2018-06-05 DIAGNOSIS — E1165 Type 2 diabetes mellitus with hyperglycemia: Secondary | ICD-10-CM | POA: Diagnosis not present

## 2018-06-05 DIAGNOSIS — I110 Hypertensive heart disease with heart failure: Secondary | ICD-10-CM | POA: Diagnosis not present

## 2018-06-05 DIAGNOSIS — N183 Chronic kidney disease, stage 3 (moderate): Secondary | ICD-10-CM | POA: Diagnosis not present

## 2018-06-05 DIAGNOSIS — E1122 Type 2 diabetes mellitus with diabetic chronic kidney disease: Secondary | ICD-10-CM | POA: Diagnosis present

## 2018-06-05 DIAGNOSIS — Z7189 Other specified counseling: Secondary | ICD-10-CM | POA: Diagnosis not present

## 2018-06-05 DIAGNOSIS — I130 Hypertensive heart and chronic kidney disease with heart failure and stage 1 through stage 4 chronic kidney disease, or unspecified chronic kidney disease: Principal | ICD-10-CM | POA: Diagnosis present

## 2018-06-05 DIAGNOSIS — R0602 Shortness of breath: Secondary | ICD-10-CM | POA: Diagnosis not present

## 2018-06-05 DIAGNOSIS — Z6839 Body mass index (BMI) 39.0-39.9, adult: Secondary | ICD-10-CM

## 2018-06-05 DIAGNOSIS — I5033 Acute on chronic diastolic (congestive) heart failure: Secondary | ICD-10-CM | POA: Diagnosis present

## 2018-06-05 DIAGNOSIS — R0902 Hypoxemia: Secondary | ICD-10-CM

## 2018-06-05 DIAGNOSIS — Z794 Long term (current) use of insulin: Secondary | ICD-10-CM | POA: Diagnosis not present

## 2018-06-05 DIAGNOSIS — Z885 Allergy status to narcotic agent status: Secondary | ICD-10-CM

## 2018-06-05 DIAGNOSIS — F0390 Unspecified dementia without behavioral disturbance: Secondary | ICD-10-CM | POA: Diagnosis present

## 2018-06-05 DIAGNOSIS — J9801 Acute bronchospasm: Secondary | ICD-10-CM | POA: Diagnosis not present

## 2018-06-05 DIAGNOSIS — G4733 Obstructive sleep apnea (adult) (pediatric): Secondary | ICD-10-CM | POA: Diagnosis not present

## 2018-06-05 DIAGNOSIS — F329 Major depressive disorder, single episode, unspecified: Secondary | ICD-10-CM | POA: Diagnosis present

## 2018-06-05 DIAGNOSIS — Z79899 Other long term (current) drug therapy: Secondary | ICD-10-CM

## 2018-06-05 DIAGNOSIS — E669 Obesity, unspecified: Secondary | ICD-10-CM | POA: Diagnosis present

## 2018-06-05 DIAGNOSIS — J9602 Acute respiratory failure with hypercapnia: Secondary | ICD-10-CM | POA: Diagnosis not present

## 2018-06-05 DIAGNOSIS — E119 Type 2 diabetes mellitus without complications: Secondary | ICD-10-CM | POA: Diagnosis not present

## 2018-06-05 DIAGNOSIS — D649 Anemia, unspecified: Secondary | ICD-10-CM | POA: Diagnosis present

## 2018-06-05 DIAGNOSIS — I272 Pulmonary hypertension, unspecified: Secondary | ICD-10-CM | POA: Diagnosis present

## 2018-06-05 DIAGNOSIS — J9622 Acute and chronic respiratory failure with hypercapnia: Secondary | ICD-10-CM | POA: Diagnosis not present

## 2018-06-05 DIAGNOSIS — J9621 Acute and chronic respiratory failure with hypoxia: Secondary | ICD-10-CM | POA: Diagnosis not present

## 2018-06-05 DIAGNOSIS — R600 Localized edema: Secondary | ICD-10-CM | POA: Diagnosis not present

## 2018-06-05 DIAGNOSIS — S0990XA Unspecified injury of head, initial encounter: Secondary | ICD-10-CM | POA: Diagnosis not present

## 2018-06-05 DIAGNOSIS — E782 Mixed hyperlipidemia: Secondary | ICD-10-CM | POA: Diagnosis present

## 2018-06-05 DIAGNOSIS — Z9911 Dependence on respirator [ventilator] status: Secondary | ICD-10-CM | POA: Diagnosis not present

## 2018-06-05 DIAGNOSIS — I10 Essential (primary) hypertension: Secondary | ICD-10-CM | POA: Diagnosis not present

## 2018-06-05 DIAGNOSIS — J441 Chronic obstructive pulmonary disease with (acute) exacerbation: Secondary | ICD-10-CM | POA: Diagnosis not present

## 2018-06-05 DIAGNOSIS — J9601 Acute respiratory failure with hypoxia: Secondary | ICD-10-CM | POA: Diagnosis not present

## 2018-06-05 DIAGNOSIS — S299XXA Unspecified injury of thorax, initial encounter: Secondary | ICD-10-CM | POA: Diagnosis not present

## 2018-06-05 DIAGNOSIS — M7989 Other specified soft tissue disorders: Secondary | ICD-10-CM

## 2018-06-05 DIAGNOSIS — I509 Heart failure, unspecified: Secondary | ICD-10-CM | POA: Diagnosis not present

## 2018-06-05 DIAGNOSIS — Z515 Encounter for palliative care: Secondary | ICD-10-CM | POA: Diagnosis not present

## 2018-06-05 LAB — CBC WITH DIFFERENTIAL/PLATELET
Abs Immature Granulocytes: 0.02 10*3/uL (ref 0.00–0.07)
BASOS ABS: 0 10*3/uL (ref 0.0–0.1)
Basophils Relative: 0 %
EOS PCT: 3 %
Eosinophils Absolute: 0.2 10*3/uL (ref 0.0–0.5)
HEMATOCRIT: 38.7 % — AB (ref 39.0–52.0)
Hemoglobin: 12 g/dL — ABNORMAL LOW (ref 13.0–17.0)
Immature Granulocytes: 0 %
LYMPHS ABS: 2.4 10*3/uL (ref 0.7–4.0)
Lymphocytes Relative: 36 %
MCH: 24.4 pg — AB (ref 26.0–34.0)
MCHC: 31 g/dL (ref 30.0–36.0)
MCV: 78.8 fL — AB (ref 80.0–100.0)
MONO ABS: 0.8 10*3/uL (ref 0.1–1.0)
Monocytes Relative: 13 %
NRBC: 0 % (ref 0.0–0.2)
Neutro Abs: 3.2 10*3/uL (ref 1.7–7.7)
Neutrophils Relative %: 48 %
Platelets: 186 10*3/uL (ref 150–400)
RBC: 4.91 MIL/uL (ref 4.22–5.81)
RDW: 15.2 % (ref 11.5–15.5)
WBC: 6.6 10*3/uL (ref 4.0–10.5)

## 2018-06-05 LAB — COMPREHENSIVE METABOLIC PANEL
ALK PHOS: 107 U/L (ref 38–126)
ALT: 15 U/L (ref 0–44)
ANION GAP: 9 (ref 5–15)
AST: 17 U/L (ref 15–41)
Albumin: 3.5 g/dL (ref 3.5–5.0)
BILIRUBIN TOTAL: 0.6 mg/dL (ref 0.3–1.2)
BUN: 18 mg/dL (ref 8–23)
CALCIUM: 8.9 mg/dL (ref 8.9–10.3)
CO2: 29 mmol/L (ref 22–32)
CREATININE: 1.33 mg/dL — AB (ref 0.61–1.24)
Chloride: 102 mmol/L (ref 98–111)
GFR, EST AFRICAN AMERICAN: 53 mL/min — AB (ref >60)
GFR, EST NON AFRICAN AMERICAN: 46 mL/min — AB (ref >60)
Glucose, Bld: 171 mg/dL — ABNORMAL HIGH (ref 70–99)
Potassium: 3.7 mmol/L (ref 3.5–5.1)
SODIUM: 140 mmol/L (ref 135–145)
TOTAL PROTEIN: 8.4 g/dL — AB (ref 6.5–8.1)

## 2018-06-05 LAB — LACTIC ACID, PLASMA
LACTIC ACID, VENOUS: 1 mmol/L (ref 0.5–1.9)
Lactic Acid, Venous: 0.9 mmol/L (ref 0.5–1.9)

## 2018-06-05 LAB — BRAIN NATRIURETIC PEPTIDE: B Natriuretic Peptide: 43 pg/mL (ref 0.0–100.0)

## 2018-06-05 LAB — GLUCOSE, CAPILLARY: Glucose-Capillary: 225 mg/dL — ABNORMAL HIGH (ref 70–99)

## 2018-06-05 LAB — TROPONIN I: Troponin I: <0.03 ng/mL (ref <0.03)

## 2018-06-05 MED ORDER — SENNOSIDES-DOCUSATE SODIUM 8.6-50 MG PO TABS
2.0000 | ORAL_TABLET | Freq: Two times a day (BID) | ORAL | Status: DC
  Administered 2018-06-05 – 2018-06-09 (×7): 2 via ORAL
  Filled 2018-06-05 (×7): qty 2

## 2018-06-05 MED ORDER — INSULIN ASPART 100 UNIT/ML ~~LOC~~ SOLN
0.0000 [IU] | Freq: Three times a day (TID) | SUBCUTANEOUS | Status: DC
  Administered 2018-06-06 (×2): 2 [IU] via SUBCUTANEOUS
  Administered 2018-06-07: 1 [IU] via SUBCUTANEOUS
  Administered 2018-06-07: 3 [IU] via SUBCUTANEOUS
  Administered 2018-06-07 – 2018-06-08 (×2): 2 [IU] via SUBCUTANEOUS
  Administered 2018-06-08: 3 [IU] via SUBCUTANEOUS
  Administered 2018-06-09: 2 [IU] via SUBCUTANEOUS
  Administered 2018-06-09: 5 [IU] via SUBCUTANEOUS
  Filled 2018-06-05 (×11): qty 1

## 2018-06-05 MED ORDER — IPRATROPIUM-ALBUTEROL 0.5-2.5 (3) MG/3ML IN SOLN
3.0000 mL | Freq: Once | RESPIRATORY_TRACT | Status: AC
  Administered 2018-06-05: 3 mL via RESPIRATORY_TRACT
  Filled 2018-06-05: qty 3

## 2018-06-05 MED ORDER — METOPROLOL SUCCINATE ER 25 MG PO TB24
37.5000 mg | ORAL_TABLET | Freq: Two times a day (BID) | ORAL | Status: DC
Start: 2018-06-05 — End: 2018-06-10
  Administered 2018-06-05 – 2018-06-09 (×7): 37.5 mg via ORAL
  Filled 2018-06-05: qty 1.5
  Filled 2018-06-05 (×2): qty 2
  Filled 2018-06-05 (×2): qty 1.5
  Filled 2018-06-05 (×4): qty 2

## 2018-06-05 MED ORDER — AMLODIPINE BESYLATE 10 MG PO TABS
10.0000 mg | ORAL_TABLET | Freq: Every day | ORAL | Status: DC
  Administered 2018-06-06 – 2018-06-09 (×4): 10 mg via ORAL
  Filled 2018-06-05 (×4): qty 1

## 2018-06-05 MED ORDER — FUROSEMIDE 10 MG/ML IJ SOLN
INTRAMUSCULAR | Status: AC
  Filled 2018-06-05: qty 4

## 2018-06-05 MED ORDER — FLUTICASONE PROPIONATE 50 MCG/ACT NA SUSP
2.0000 | Freq: Every day | NASAL | Status: DC
  Administered 2018-06-05 – 2018-06-08 (×4): 2 via NASAL
  Filled 2018-06-05: qty 16

## 2018-06-05 MED ORDER — ACETAMINOPHEN 325 MG PO TABS: 650.0000 mg | ORAL_TABLET | Freq: Four times a day (QID) | ORAL | Status: DC | PRN

## 2018-06-05 MED ORDER — ALBUTEROL SULFATE (2.5 MG/3ML) 0.083% IN NEBU
2.5000 mg | INHALATION_SOLUTION | RESPIRATORY_TRACT | Status: DC | PRN
  Administered 2018-06-05: 2.5 mg via RESPIRATORY_TRACT

## 2018-06-05 MED ORDER — INSULIN DETEMIR 100 UNIT/ML ~~LOC~~ SOLN
63.0000 [IU] | Freq: Every day | SUBCUTANEOUS | Status: DC
  Administered 2018-06-05 – 2018-06-06 (×2): 63 [IU] via SUBCUTANEOUS
  Filled 2018-06-05 (×3): qty 0.63

## 2018-06-05 MED ORDER — ACETAMINOPHEN 650 MG RE SUPP: 650.0000 mg | Freq: Four times a day (QID) | RECTAL | Status: DC | PRN

## 2018-06-05 MED ORDER — ALBUTEROL SULFATE (2.5 MG/3ML) 0.083% IN NEBU
INHALATION_SOLUTION | RESPIRATORY_TRACT | Status: AC
  Administered 2018-06-05: 2.5 mg via RESPIRATORY_TRACT
  Filled 2018-06-05: qty 3

## 2018-06-05 MED ORDER — ALBUTEROL SULFATE (2.5 MG/3ML) 0.083% IN NEBU
5.0000 mg | INHALATION_SOLUTION | Freq: Once | RESPIRATORY_TRACT | Status: AC
  Administered 2018-06-05: 2.5 mg via RESPIRATORY_TRACT
  Filled 2018-06-05: qty 6

## 2018-06-05 MED ORDER — ATORVASTATIN CALCIUM 10 MG PO TABS
10.0000 mg | ORAL_TABLET | Freq: Every day | ORAL | Status: DC
  Administered 2018-06-06 – 2018-06-09 (×4): 10 mg via ORAL
  Filled 2018-06-05 (×4): qty 1

## 2018-06-05 MED ORDER — SODIUM CHLORIDE 0.9% FLUSH
3.0000 mL | Freq: Two times a day (BID) | INTRAVENOUS | Status: DC
  Administered 2018-06-05 – 2018-06-09 (×8): 3 mL via INTRAVENOUS

## 2018-06-05 MED ORDER — HEPARIN SODIUM (PORCINE) 5000 UNIT/ML IJ SOLN
5000.0000 [IU] | Freq: Three times a day (TID) | INTRAMUSCULAR | Status: DC
  Administered 2018-06-05 – 2018-06-09 (×10): 5000 [IU] via SUBCUTANEOUS
  Filled 2018-06-05 (×10): qty 1

## 2018-06-05 MED ORDER — SODIUM CHLORIDE 0.9 % IV SOLN: 250.0000 mL | INTRAVENOUS | Status: DC | PRN

## 2018-06-05 MED ORDER — SENNOSIDES-DOCUSATE SODIUM 8.6-50 MG PO TABS: 1.0000 | ORAL_TABLET | Freq: Every evening | ORAL | Status: DC | PRN

## 2018-06-05 MED ORDER — FUROSEMIDE 10 MG/ML IJ SOLN
40.0000 mg | Freq: Two times a day (BID) | INTRAMUSCULAR | Status: DC
  Administered 2018-06-05 – 2018-06-06 (×2): 40 mg via INTRAVENOUS
  Filled 2018-06-05: qty 4

## 2018-06-05 MED ORDER — IPRATROPIUM BROMIDE 0.06 % NA SOLN
2.0000 | Freq: Three times a day (TID) | NASAL | Status: DC
  Administered 2018-06-05 – 2018-06-09 (×5): 2 via NASAL
  Filled 2018-06-05: qty 15

## 2018-06-05 MED ORDER — ALBUTEROL SULFATE (2.5 MG/3ML) 0.083% IN NEBU
INHALATION_SOLUTION | RESPIRATORY_TRACT | Status: AC
  Filled 2018-06-05: qty 3

## 2018-06-05 MED ORDER — METHYLPREDNISOLONE SODIUM SUCC 125 MG IJ SOLR
125.0000 mg | Freq: Once | INTRAMUSCULAR | Status: AC
  Administered 2018-06-05: 125 mg via INTRAVENOUS
  Filled 2018-06-05: qty 2

## 2018-06-05 MED ORDER — TIZANIDINE HCL 2 MG PO TABS
2.0000 mg | ORAL_TABLET | Freq: Two times a day (BID) | ORAL | Status: DC
  Administered 2018-06-05 – 2018-06-09 (×7): 2 mg via ORAL
  Filled 2018-06-05 (×9): qty 1

## 2018-06-05 MED ORDER — INSULIN ASPART 100 UNIT/ML ~~LOC~~ SOLN
0.0000 [IU] | Freq: Every day | SUBCUTANEOUS | Status: DC
  Administered 2018-06-05: 2 [IU] via SUBCUTANEOUS
  Administered 2018-06-07: 3 [IU] via SUBCUTANEOUS
  Administered 2018-06-08: 4 [IU] via SUBCUTANEOUS
  Filled 2018-06-05 (×4): qty 1

## 2018-06-05 MED ORDER — SODIUM CHLORIDE 0.9% FLUSH: 3.0000 mL | INTRAVENOUS | Status: DC | PRN

## 2018-06-05 MED ORDER — POLYETHYLENE GLYCOL 3350 17 G PO PACK
17.0000 g | PACK | Freq: Every day | ORAL | Status: DC
  Administered 2018-06-07 – 2018-06-09 (×3): 17 g via ORAL
  Filled 2018-06-05 (×4): qty 1

## 2018-06-05 MED ORDER — BISACODYL 5 MG PO TBEC: 5.0000 mg | DELAYED_RELEASE_TABLET | Freq: Every day | ORAL | Status: DC | PRN

## 2018-06-05 MED ORDER — MEMANTINE HCL ER 28 MG PO CP24
28.0000 mg | ORAL_CAPSULE | Freq: Every day | ORAL | Status: DC
  Administered 2018-06-06 – 2018-06-09 (×4): 28 mg via ORAL
  Filled 2018-06-05 (×5): qty 1

## 2018-06-05 MED ORDER — ESCITALOPRAM OXALATE 10 MG PO TABS
5.0000 mg | ORAL_TABLET | Freq: Every day | ORAL | Status: DC
  Administered 2018-06-06 – 2018-06-09 (×4): 5 mg via ORAL
  Filled 2018-06-05 (×5): qty 0.5

## 2018-06-05 MED ORDER — FUROSEMIDE 10 MG/ML IJ SOLN
40.0000 mg | Freq: Once | INTRAMUSCULAR | Status: AC
  Administered 2018-06-05: 40 mg via INTRAVENOUS
  Filled 2018-06-05: qty 4

## 2018-06-05 MED ORDER — ONDANSETRON HCL 4 MG PO TABS: 4.0000 mg | ORAL_TABLET | Freq: Four times a day (QID) | ORAL | Status: DC | PRN

## 2018-06-05 MED ORDER — ONDANSETRON HCL 4 MG/2ML IJ SOLN: 4.0000 mg | Freq: Four times a day (QID) | INTRAMUSCULAR | Status: DC | PRN

## 2018-06-05 MED ORDER — GUAIFENESIN-DM 100-10 MG/5ML PO SYRP
5.0000 mL | ORAL_SOLUTION | ORAL | Status: DC | PRN
  Administered 2018-06-07: 5 mL via ORAL
  Filled 2018-06-05: qty 5

## 2018-06-05 NOTE — ED Triage Notes (Signed)
Pt brought into ED via [facility] with complaints of SOB for the last week, pt was seen by Dr. Reinaldo Raddle today and diagnosised with URI, He ordered 2L [place] at all times and they were going to get a CXR, but family wanted him evaluated at ED. When EMS arrived O2 was in the low 80's but [place] was on his ear not in his nose. They gave him a breathing treatment and sats went up to 98%. Pt is whezzing and has diminished lung sounds in both bases.

## 2018-06-05 NOTE — Consult Note (Signed)
[facility] Consultation Note  Patient ID: Daniel [name], MRN: [id number], DOB/AGE: 02/10/1928 84 y.o. Admit date: 06/05/2018   Date of Consult: 06/05/2018 Primary Physician: Glendale Chard, MD Primary Cardiologist: None  Chief Complaint:  Chief Complaint  Patient presents with  . Shortness of Breath   Reason for Consult: Heart failure  HPI: 84 y.o. male with known diabetes essential hypertension mixed hyperlipidemia previous myocardial infarction with chronic kidney disease stage III having acute congestive heart failure multifactorial in nature.  The patient has dementia and is very difficult to get the history but he has significant lower extremity edema shortness of breath weakness and fatigue over the last several weeks.  He is unable to be active and he had does have some ulcers in his legs.  Therefore he was seen in the emergency room at that time an EKG was performed showing normal sinus rhythm with inferior infarct age undetermined troponin was normal without evidence of myocardial infarction and chest x-ray shows pleural effusions  Past Medical History:  Diagnosis Date  . Arthritis   . Chronic kidney disease   . Dementia ([place])   . Diabetes mellitus without complication (HCC)    Type 2  . Hyperlipidemia   . Hypertension   . Low back pain   . Muscle weakness (generalized)   . Other fracture of t5-T6 vertebra, subsequent encounter for fracture with routine healing   . Polyosteoarthritis   . Renal disorder   . Type 2 diabetes mellitus ([place])   . Unspecified fracture of t5-T6 vertebra, subsequent encounter for fracture with routine healing       Surgical History:  Past Surgical History:  Procedure Laterality Date  . BACK SURGERY    . CHOLECYSTECTOMY    . HERNIA REPAIR    . KNEE SURGERY    . teeth pulled  Bilateral 08/13/14   teeth pulled and bottom gum removed     Home Meds: Prior to Admission medications   Medication Sig Start Date End Date Taking?  Authorizing Provider  amLODipine (NORVASC) 10 MG tablet Take 10 mg by mouth daily.   Yes [provider]  atorvastatin (LIPITOR) 10 MG tablet Take 10 mg by mouth daily.    Yes [provider]  bumetanide (BUMEX) 0.5 MG tablet Take 0.5 mg by mouth daily.   Yes [provider]  escitalopram (LEXAPRO) 5 MG tablet Take 5 mg by mouth daily.   Yes [provider]  fluticasone (FLONASE) 50 MCG/ACT nasal spray Place 2 sprays into both nostrils daily. Patient taking differently: Place 2 sprays into both nostrils at bedtime.  09/30/17  Yes Yu, Amy V, PA-C  glimepiride (AMARYL) 4 MG tablet Take 4 mg by mouth daily.   Yes [provider]  insulin aspart (NOVOLOG) 100 UNIT/ML injection Inject 3-11 Units into the skin 4 (four) times daily -  before meals and at bedtime. Per sliding scale: 3 units = if blood sugar is 201-250 5 units = if blood sugar is 251-300 7 units = if blood sugar is 301-350 9 units = if blood sugar is 351-400 11 units = if blood sugar is greater than 400   Yes [provider]  insulin detemir (LEVEMIR) 100 UNIT/ML injection Inject 63 Units into the skin daily.   Yes [provider]  ipratropium (ATROVENT) 0.06 % nasal spray Place 2 sprays into both nostrils 4 (four) times daily. Patient taking differently: Place 2 sprays into both nostrils 3 (three) times daily.  09/30/17  Yes Yu, Amy V, PA-C  linagliptin (TRADJENTA) 5 MG TABS tablet Take 5 mg by mouth daily.   Yes [provider]  losartan (COZAAR) 100 MG tablet Take 100 mg by mouth daily.   Yes [provider]  memantine (NAMENDA XR) 28 MG CP24 24 hr capsule Take 28 mg by mouth daily.   Yes [provider]  metoprolol succinate (TOPROL-XL) 25 MG 24 hr tablet Take 37.5 mg by mouth 2 (two) times daily.   Yes [provider]  polyethylene glycol (MIRALAX / GLYCOLAX) packet Take 17 g by mouth daily.   Yes [provider]  senna-docusate  (SENOKOT-S) 8.6-50 MG tablet Take 2 tablets by mouth 2 (two) times daily.   Yes [provider]  tizanidine (ZANAFLEX) 2 MG capsule Take 2 mg by mouth 2 (two) times daily.   Yes [provider]    Inpatient Medications:  . albuterol      . amLODipine  10 mg Oral Daily  . atorvastatin  10 mg Oral Daily  . escitalopram  5 mg Oral Daily  . fluticasone  2 spray Each Nare QHS  . furosemide      . furosemide  40 mg Intravenous Q12H  . heparin  5,000 Units Subcutaneous Q8H  . insulin aspart  0-5 Units Subcutaneous QHS  . [START ON 06/06/2018] insulin aspart  0-9 Units Subcutaneous TID WC  . insulin detemir  63 Units Subcutaneous Daily  . ipratropium  2 spray Each Nare TID  . memantine  28 mg Oral Daily  . metoprolol succinate  37.5 mg Oral BID  . polyethylene glycol  17 g Oral Daily  . senna-docusate  2 tablet Oral BID  . sodium chloride flush  3 mL Intravenous Q12H  . tiZANidine  2 mg Oral BID   . sodium chloride      Allergies:  Allergies  Allergen Reactions  . Oxycodone Nausea And Vomiting  . Codeine     Social History   Socioeconomic History  . Marital status: Widowed    Spouse name: Not on file  . Number of children: Not on file  . Years of education: Not on file  . Highest education level: Not on file  Occupational History  . Not on file  Social Needs  . Financial resource strain: Not on file  . Food insecurity:    Worry: Not on file    Inability: Not on file  . Transportation needs:    Medical: Not on file    Non-medical: Not on file  Tobacco Use  . Smoking status: Never Smoker  . Smokeless tobacco: Never Used  Substance and Sexual Activity  . Alcohol use: No  . Drug use: Not on file  . Sexual activity: Not on file  Lifestyle  . Physical activity:    Days per week: Not on file    Minutes per session: Not on file  . Stress: Not on file  Relationships  . Social connections:    Talks on phone: Not on file    Gets together: Not on file     Attends religious service: Not on file    Active member of club or organization: Not on file    Attends meetings of clubs or organizations: Not on file    Relationship status: Not on file  . Intimate partner violence:    Fear of current or ex partner: Not on file    Emotionally abused: Not on file    Physically  abused: Not on file    Forced sexual activity: Not on file  Other Topics Concern  . Not on file  Social History Narrative  . Not on file     Family History  Problem Relation Age of Onset  . Hypertension Father      Review of Systems Positive for shortness of breath Negative for: General:  chills, fever, night sweats or weight changes.  Cardiovascular: PND orthopnea syncope dizziness  Dermatological skin lesions rashes Respiratory: Cough congestion Urologic: Frequent urination urination at night and hematuria Abdominal: negative for nausea, vomiting, diarrhea, bright red blood per rectum, melena, or hematemesis Neurologic: negative for visual changes, and/or hearing changes  All other systems reviewed and are otherwise negative except as noted above.  Labs: Recent Labs    06/05/18 1428  TROPONINI <0.03   Lab Results  Component Value Date   WBC 6.6 06/05/2018   HGB 12.0 (L) 06/05/2018   HCT 38.7 (L) 06/05/2018   MCV 78.8 (L) 06/05/2018   PLT 186 06/05/2018    Recent Labs  Lab 06/05/18 1428  NA 140  K 3.7  CL 102  CO2 29  BUN 18  CREATININE 1.33*  CALCIUM 8.9  PROT 8.4*  BILITOT 0.6  ALKPHOS 107  ALT 15  AST 17  GLUCOSE 171*   Lab Results  Component Value Date   CHOL 180 01/29/2012   HDL 27 (L) 01/29/2012   LDLCALC 106 (H) 01/29/2012   TRIG 236 (H) 01/29/2012   No results found for: DDIMER  Radiology/Studies:  Dg Chest 2 View  Result Date: 06/05/2018 CLINICAL DATA:  Increase shortness-of-breath 1 week. Oxygen dependent. EXAM: CHEST - 2 VIEW COMPARISON:  05/10/2016 FINDINGS: Lungs are hypoinflated with mild stable elevation of the left  hemidiaphragm. Mild bibasilar opacification is present which may be due to effusions with atelectasis versus infection. Stable cardiomegaly. Remainder of the exam is unchanged. IMPRESSION: Hypoinflation with bibasilar opacification likely effusions with atelectasis, although infection is possible within the lung bases. Stable cardiomegaly. Electronically Signed   By: Marin Olp M.D.   On: 06/05/2018 13:54   Ct Head Wo Contrast  Result Date: 06/05/2018 CLINICAL DATA:  84 year old male with RIGHT arm weakness and recent falls. EXAM: CT HEAD WITHOUT CONTRAST TECHNIQUE: Contiguous axial images were obtained from the base of the skull through the vertex without intravenous contrast. COMPARISON:  01/29/2012 CT FINDINGS: Brain: No evidence of acute infarction, hemorrhage, hydrocephalus, extra-axial collection or mass lesion/mass effect. Atrophy and chronic small-vessel white matter ischemic changes again noted. Vascular: Intracranial atherosclerotic calcifications again noted. Skull: Normal. Negative for fracture or focal lesion. Sinuses/Orbits: No acute finding. Other: None. IMPRESSION: 1. No evidence of acute intracranial abnormality 2. Atrophy and chronic small-vessel white matter ischemic changes. Electronically Signed   By: Margarette Canada M.D.   On: 06/05/2018 14:19    EKG: Normal sinus rhythm with inferior infarct age undetermined  Weights: Filed Weights   06/05/18 1310 06/05/18 1823  Weight: 124.7 kg 120.8 kg     Physical Exam: Blood pressure (!) 141/57, pulse 64, temperature (!) 97.5 F (36.4 C), temperature source Oral, resp. rate 20, height 5\' 9"  (1.753 m), weight 120.8 kg, SpO2 98 %. Body mass index is 39.33 kg/m. General: Well developed, well nourished, in no acute distress. Head eyes ears nose throat: Normocephalic, atraumatic, sclera non-icteric, no xanthomas, nares are without discharge. No apparent thyromegaly and/or mass  Lungs: Normal respiratory effort.  no wheezes, bibasilar rales,  some rhonchi.  Heart: RRR with normal  S1 S2. no murmur gallop, no rub, PMI is normal size and placement, carotid upstroke normal without bruit, jugular venous pressure is normal Abdomen: Soft, non-tender, non-distended with normoactive bowel sounds. No hepatomegaly. No rebound/guarding. No obvious abdominal masses. Abdominal aorta is normal size without bruit Extremities: 2+ edema. no cyanosis, no clubbing, no ulcers  Peripheral : 2+ bilateral upper extremity pulses, 0 + bilateral femoral pulses, 0 + bilateral dorsal pedal pulse Neuro: Alert and oriented. No facial asymmetry. No focal deficit. Moves all extremities spontaneously. Musculoskeletal: Normal muscle tone without kyphosis Psych:  Responds to questions appropriately with a normal affect.    Assessment: 84 year old male with acute diastolic dysfunction heart failure with chronic kidney disease essential hypertension mixed hyperlipidemia any further treatment options  Plan: 1.  Diuresis with intravenous Lasix for lower extremity edema pleural effusions and shortness of breath 2.  Continue medication management for hypertension control including metoprolol amlodipine and would consider discontinuation of amlodipine if lower extremity edema mainly due to side effects of that medication 3.  Continue high intensity cholesterol therapy for previous inferior infarct 4.  Echocardiogram for LV systolic dysfunction valvular heart disease 5.  Further treatment options after above  Signed, Corey Skains M.D. [facility] 06/05/2018, 7:01 PM

## 2018-06-05 NOTE — H&P (Signed)
[facility] NAME: Daniel [name]    MR#:  [id number]  DATE OF BIRTH:  1927-10-01  DATE OF ADMISSION:  06/05/2018  PRIMARY CARE PHYSICIAN: Glendale Chard, MD   REQUESTING/REFERRING PHYSICIAN: Dr. Reita Cliche.  CHIEF COMPLAINT:   Chief Complaint  Patient presents with  . Shortness of Breath   Worsening shortness of breath today.  Leg swelling for 3 weeks. HISTORY OF PRESENT ILLNESS:  Daniel [name]  is a 84 y.o. male with a known history of CKD, dementia, diabetes, hypertension, hyperlipidemia, arthritis, back pain, muscle weakness.  The patient presents the ED with worsening shortness of breath today.  He said he has had leg swelling for the past 3 weeks.  He also complains of wheezing and a cough.  He denies any fever or chills, no chest pain no palpitation.  He was found hypoxia at 80s in the ED, put on oxygen by nasal cannula.  He regularly used wheelchair and does not walk.  Chest x-ray show hyperventilation and atelectasis changes.  He is suspected CHF and given Lasix IV.  PAST MEDICAL HISTORY:   Past Medical History:  Diagnosis Date  . Arthritis   . Chronic kidney disease   . Dementia ([place])   . Diabetes mellitus without complication (HCC)    Type 2  . Hyperlipidemia   . Hypertension   . Low back pain   . Muscle weakness (generalized)   . Other fracture of t5-T6 vertebra, subsequent encounter for fracture with routine healing   . Polyosteoarthritis   . Renal disorder   . Type 2 diabetes mellitus ([place])   . Unspecified fracture of t5-T6 vertebra, subsequent encounter for fracture with routine healing     PAST SURGICAL HISTORY:   Past Surgical History:  Procedure Laterality Date  . BACK SURGERY    . CHOLECYSTECTOMY    . HERNIA REPAIR    . KNEE SURGERY    . teeth pulled  Bilateral 08/13/14   teeth pulled and bottom gum removed    SOCIAL HISTORY:   Social History   Tobacco Use  . Smoking status: Never Smoker  .  Smokeless tobacco: Never Used  Substance Use Topics  . Alcohol use: No    FAMILY HISTORY:   Family History  Problem Relation Age of Onset  . Hypertension Father     DRUG ALLERGIES:   Allergies  Allergen Reactions  . Oxycodone Nausea And Vomiting  . Codeine     REVIEW OF SYSTEMS:   Review of Systems  Constitutional: Positive for malaise/fatigue. Negative for chills and fever.  HENT: Negative for sore throat.   Eyes: Negative for blurred vision and double vision.  Respiratory: Positive for cough, sputum production, shortness of breath and wheezing. Negative for hemoptysis and stridor.   Cardiovascular: Positive for orthopnea and leg swelling. Negative for chest pain and palpitations.  Gastrointestinal: Negative for abdominal pain, blood in stool, diarrhea, melena, nausea and vomiting.  Genitourinary: Negative for dysuria, flank pain and hematuria.  Musculoskeletal: Negative for back pain and joint pain.  Skin: Negative for rash.  Neurological: Negative for dizziness, sensory change, focal weakness, seizures, loss of consciousness, weakness and headaches.  Endo/Heme/Allergies: Negative for polydipsia.  Psychiatric/Behavioral: Negative for depression. The patient is not nervous/anxious.     MEDICATIONS AT HOME:   Prior to Admission medications   Medication Sig Start Date End Date Taking? Authorizing Provider  amLODipine (NORVASC) 10 MG tablet Take 10 mg by mouth daily.  Yes [provider]  atorvastatin (LIPITOR) 10 MG tablet Take 10 mg by mouth daily.    Yes [provider]  bumetanide (BUMEX) 0.5 MG tablet Take 0.5 mg by mouth daily.   Yes [provider]  escitalopram (LEXAPRO) 5 MG tablet Take 5 mg by mouth daily.   Yes [provider]  fluticasone (FLONASE) 50 MCG/ACT nasal spray Place 2 sprays into both nostrils daily. Patient taking differently: Place 2 sprays into both nostrils at bedtime.  09/30/17  Yes Yu, Amy V, PA-C    glimepiride (AMARYL) 4 MG tablet Take 4 mg by mouth daily.   Yes [provider]  insulin aspart (NOVOLOG) 100 UNIT/ML injection Inject 3-11 Units into the skin 4 (four) times daily -  before meals and at bedtime. Per sliding scale: 3 units = if blood sugar is 201-250 5 units = if blood sugar is 251-300 7 units = if blood sugar is 301-350 9 units = if blood sugar is 351-400 11 units = if blood sugar is greater than 400   Yes [provider]  insulin detemir (LEVEMIR) 100 UNIT/ML injection Inject 63 Units into the skin daily.   Yes [provider]  ipratropium (ATROVENT) 0.06 % nasal spray Place 2 sprays into both nostrils 4 (four) times daily. Patient taking differently: Place 2 sprays into both nostrils 3 (three) times daily.  09/30/17  Yes Yu, Amy V, PA-C  linagliptin (TRADJENTA) 5 MG TABS tablet Take 5 mg by mouth daily.   Yes [provider]  losartan (COZAAR) 100 MG tablet Take 100 mg by mouth daily.   Yes [provider]  memantine (NAMENDA XR) 28 MG CP24 24 hr capsule Take 28 mg by mouth daily.   Yes [provider]  metoprolol succinate (TOPROL-XL) 25 MG 24 hr tablet Take 37.5 mg by mouth 2 (two) times daily.   Yes [provider]  polyethylene glycol (MIRALAX / GLYCOLAX) packet Take 17 g by mouth daily.   Yes [provider]  senna-docusate (SENOKOT-S) 8.6-50 MG tablet Take 2 tablets by mouth 2 (two) times daily.   Yes [provider]  tizanidine (ZANAFLEX) 2 MG capsule Take 2 mg by mouth 2 (two) times daily.   Yes [provider]      VITAL SIGNS:  Blood pressure (!) 154/81, pulse (!) 57, temperature 97.7 F (36.5 C), temperature source Oral, resp. rate 16, height 6\' 2"  (1.88 m), weight 124.7 kg, SpO2 99 %.  PHYSICAL EXAMINATION:  Physical Exam  GENERAL:  84 y.o.-year-old patient lying in the bed with no acute distress.  Obesity. EYES: Pupils equal, round, reactive to light and  accommodation. No scleral icterus. Extraocular muscles intact.  HEENT: Head atraumatic, normocephalic. Oropharynx and nasopharynx clear.  NECK:  Supple, no jugular venous distention. No thyroid enlargement, no tenderness.  LUNGS: Mild wheezing, bibasilar rales, no rhonchi or crepitation. No use of accessory muscles of respiration.  CARDIOVASCULAR: S1, S2 normal. No murmurs, rubs, or gallops.  ABDOMEN: Soft, nontender, nondistended. Bowel sounds present. No organomegaly or mass.  EXTREMITIES: Bilateral leg edema 2+.  No cyanosis, or clubbing.  NEUROLOGIC: Cranial nerves II through XII are intact. Muscle strength 4/5 in all extremities. Sensation intact. Gait not checked.  PSYCHIATRIC: The patient is alert and oriented x 3.  SKIN: No obvious rash, lesion, or ulcer.   LABORATORY PANEL:   CBC Recent Labs  Lab 06/05/18 1428  WBC 6.6  HGB 12.0*  HCT 38.7*  PLT 186   ------------------------------------------------------------------------------------------------------------------  Chemistries  Recent Labs  Lab 06/05/18 1428  NA 140  K 3.7  CL 102  CO2 29  GLUCOSE 171*  BUN 18  CREATININE 1.33*  CALCIUM 8.9  AST 17  ALT 15  ALKPHOS 107  BILITOT 0.6   ------------------------------------------------------------------------------------------------------------------  Cardiac Enzymes Recent Labs  Lab 06/05/18 1428  TROPONINI <0.03   ------------------------------------------------------------------------------------------------------------------  RADIOLOGY:  Dg Chest 2 View  Result Date: 06/05/2018 CLINICAL DATA:  Increase shortness-of-breath 1 week. Oxygen dependent. EXAM: CHEST - 2 VIEW COMPARISON:  05/10/2016 FINDINGS: Lungs are hypoinflated with mild stable elevation of the left hemidiaphragm. Mild bibasilar opacification is present which may be due to effusions with atelectasis versus infection. Stable cardiomegaly. Remainder of the exam is unchanged. IMPRESSION:  Hypoinflation with bibasilar opacification likely effusions with atelectasis, although infection is possible within the lung bases. Stable cardiomegaly. Electronically Signed   By: Marin Olp M.D.   On: 06/05/2018 13:54   Ct Head Wo Contrast  Result Date: 06/05/2018 CLINICAL DATA:  84 year old male with RIGHT arm weakness and recent falls. EXAM: CT HEAD WITHOUT CONTRAST TECHNIQUE: Contiguous axial images were obtained from the base of the skull through the vertex without intravenous contrast. COMPARISON:  01/29/2012 CT FINDINGS: Brain: No evidence of acute infarction, hemorrhage, hydrocephalus, extra-axial collection or mass lesion/mass effect. Atrophy and chronic small-vessel white matter ischemic changes again noted. Vascular: Intracranial atherosclerotic calcifications again noted. Skull: Normal. Negative for fracture or focal lesion. Sinuses/Orbits: No acute finding. Other: None. IMPRESSION: 1. No evidence of acute intracranial abnormality 2. Atrophy and chronic small-vessel white matter ischemic changes. Electronically Signed   By: Margarette Canada M.D.   On: 06/05/2018 14:19      IMPRESSION AND PLAN:   Acute respiratory failure with hypoxia, possible due to new onset CHF. The patient will be admitted to medical floor with telemetry monitor. Continue oxygen by nasal cannula, DuoNeb as needed. Start Lasix 40 mg IV every 12 hours, echocardiograph and cardiology consult.  CHF protocol.  Hypertension.  Continue home hypertension medication. CKD stage III.  Stable. Diabetes.  Continue Levemir, start sliding scale. Obesity.  All the records are reviewed and case discussed with ED provider. Management plans discussed with the patient, family and they are in agreement.  CODE STATUS: Full code.  TOTAL TIME TAKING CARE OF THIS PATIENT: 38 minutes.    Demetrios Loll M.D on 06/05/2018 at 4:36 PM  Between 7am to 6pm - Pager - [phone]  After 6pm go to [email or web address] - Proofreader  [facility]  Office  [phone]  CC: Primary care physician; Glendale Chard, MD   Note: This dictation was prepared with Dragon dictation along with smaller phrase technology. Any transcriptional errors that result from this process are unin

## 2018-06-05 NOTE — ED Provider Notes (Signed)
[facility] Emergency Department Provider Note ____________________________________________   I have reviewed the triage vital signs and the triage nursing note.  HISTORY  Chief Complaint Shortness of Breath   Historian Patient  HPI Daniel [name] is a 84 y.o. male with a history of  reportedly diabetes, hypertension, and dementia who apparently is from [place] presenting for shortness of breath and wheezing.  He was at his primary care physician office and found to be wheezing and was referred to the ED for further evaluation.  Fairly short of breath and wheezing for couple of days.  Does not carry a diagnosis of COPD or asthma so far as I can tell.  He does have lower extremity edema which he states is a little worse than previous although he cannot really tell me over what time period.  He does not recognize a diagnosis of congestive heart failure.  Based on his medication list he is on Bumex, question peripheral edema versus CHF.  Denies chest pain.  Found to be hypoxic into the 80s on room air.  Nothing makes short of breath worse or better  Also tells me that he does not walk, he uses a wheelchair.  States that his right arm feels heavy and that it has been like that for couple weeks.  Denies history of stroke.    Past Medical History:  Diagnosis Date  . Arthritis   . Chronic kidney disease   . Dementia ([place])   . Diabetes mellitus without complication (HCC)    Type 2  . Hyperlipidemia   . Hypertension   . Low back pain   . Muscle weakness (generalized)   . Other fracture of t5-T6 vertebra, subsequent encounter for fracture with routine healing   . Polyosteoarthritis   . Renal disorder   . Type 2 diabetes mellitus ([place])   . Unspecified fracture of t5-T6 vertebra, subsequent encounter for fracture with routine healing     Patient Active Problem List   Diagnosis Date Noted  . Right groin pain 12/06/2017  . Tibia/fibula fracture  09/26/2015    Past Surgical History:  Procedure Laterality Date  . BACK SURGERY    . CHOLECYSTECTOMY    . HERNIA REPAIR    . KNEE SURGERY    . teeth pulled  Bilateral 08/13/14   teeth pulled and bottom gum removed    Prior to Admission medications   Medication Sig Start Date End Date Taking? Authorizing Provider  amLODipine (NORVASC) 10 MG tablet Take 10 mg by mouth daily.   Yes [provider]  atorvastatin (LIPITOR) 10 MG tablet Take 10 mg by mouth daily.    Yes [provider]  bumetanide (BUMEX) 0.5 MG tablet Take 0.5 mg by mouth daily.   Yes [provider]  escitalopram (LEXAPRO) 5 MG tablet Take 5 mg by mouth daily.   Yes [provider]  fluticasone (FLONASE) 50 MCG/ACT nasal spray Place 2 sprays into both nostrils daily. Patient taking differently: Place 2 sprays into both nostrils at bedtime.  09/30/17  Yes Yu, Amy V, PA-C  glimepiride (AMARYL) 4 MG tablet Take 4 mg by mouth daily.   Yes [provider]  insulin aspart (NOVOLOG) 100 UNIT/ML injection Inject 3-11 Units into the skin 4 (four) times daily -  before meals and at bedtime. Per sliding scale: 3 units = if blood sugar is 201-250 5 units = if blood sugar is 251-300 7 units = if blood sugar is 301-350 9 units =  if blood sugar is 351-400 11 units = if blood sugar is greater than 400   Yes [provider]  insulin detemir (LEVEMIR) 100 UNIT/ML injection Inject 63 Units into the skin daily.   Yes [provider]  ipratropium (ATROVENT) 0.06 % nasal spray Place 2 sprays into both nostrils 4 (four) times daily. Patient taking differently: Place 2 sprays into both nostrils 3 (three) times daily.  09/30/17  Yes Yu, Amy V, PA-C  linagliptin (TRADJENTA) 5 MG TABS tablet Take 5 mg by mouth daily.   Yes [provider]  losartan (COZAAR) 100 MG tablet Take 100 mg by mouth daily.   Yes [provider]  memantine (NAMENDA XR) 28 MG CP24 24 hr capsule Take  28 mg by mouth daily.   Yes [provider]  metoprolol succinate (TOPROL-XL) 25 MG 24 hr tablet Take 37.5 mg by mouth 2 (two) times daily.   Yes [provider]  polyethylene glycol (MIRALAX / GLYCOLAX) packet Take 17 g by mouth daily.   Yes [provider]  senna-docusate (SENOKOT-S) 8.6-50 MG tablet Take 2 tablets by mouth 2 (two) times daily.   Yes [provider]  tizanidine (ZANAFLEX) 2 MG capsule Take 2 mg by mouth 2 (two) times daily.   Yes [provider]    Allergies  Allergen Reactions  . Oxycodone Nausea And Vomiting  . Codeine     Family History  Problem Relation Age of Onset  . Hypertension Father     Social History Social History   Tobacco Use  . Smoking status: Never Smoker  . Smokeless tobacco: Never Used  Substance Use Topics  . Alcohol use: No  . Drug use: Not on file    Review of Systems  Constitutional: Negative for fever. Eyes: Negative for visual changes. ENT: Negative for sore throat. Cardiovascular: Negative for chest pain. Respiratory: As noted for wheezing and for shortness of breath. Gastrointestinal: Negative for abdominal pain, vomiting and diarrhea. Genitourinary: Negative for dysuria. Musculoskeletal: Negative for back pain. Skin: Negative for rash. Neurological: Negative for headache.  ____________________________________________   PHYSICAL EXAM:  VITAL SIGNS: ED Triage Vitals  Enc Vitals Group     BP 06/05/18 1307 (!) 142/69     Pulse Rate 06/05/18 1307 61     Resp 06/05/18 1307 19     Temp 06/05/18 1307 97.7 F (36.5 C)     Temp Source 06/05/18 1307 Oral     SpO2 06/05/18 1307 96 %     Weight 06/05/18 1310 275 lb (124.7 kg)     Height 06/05/18 1310 6\' 2"  (1.88 m)     Head Circumference --      Peak Flow --      Pain Score 06/05/18 1310 0     Pain Loc --      Pain Edu? --      Excl. in [place]? --      Constitutional: Alert and oriented.  HEENT      Head: Normocephalic and  atraumatic.      Eyes: Conjunctivae are normal. Pupils equal and round.       Ears:         Nose: No congestion/rhinnorhea.      Mouth/Throat: Mucous membranes are moist.      Neck: No stridor. Cardiovascular/Chest: Normal rate, regular rhythm.  No murmurs, rubs, or gallops. Respiratory: Normal respiratory effort without tachypnea nor retractions.  Moderate to severe wheezing in all fields.  Moderate decreased breath  sounds throughout all fields. Gastrointestinal: Soft. No distention, no guarding, no rebound. Nontender.  Obese. Genitourinary/rectal:Deferred Musculoskeletal: Nontender with normal range of motion in all extremities.  4+ pitting edema bilateral lower extremities. Neurologic: No facial droop.  No slurred speech.  Normal speech and language.  Generalized weakness in all 4 extremities.  He cannot hold his right up against gravity. Skin:  Skin is warm, dry and intact. No rash noted. Psychiatric: Mood and affect are normal. Speech and behavior are normal. Patient exhibits appropriate insight and judgment.   ____________________________________________  LABS (pertinent positives/negatives) I, Lisa Roca, MD the attending physician have reviewed the labs noted below.  Labs Reviewed  CBC WITH DIFFERENTIAL/PLATELET - Abnormal; Notable for the following components:      Result Value   Hemoglobin 12.0 (*)    HCT 38.7 (*)    MCV 78.8 (*)    MCH 24.4 (*)    All other components within normal limits  COMPREHENSIVE METABOLIC PANEL - Abnormal; Notable for the following components:   Glucose, Bld 171 (*)    Creatinine, Ser 1.33 (*)    Total Protein 8.4 (*)    GFR calc non Af Amer 46 (*)    GFR calc Af Amer 53 (*)    All other components within normal limits  CULTURE, BLOOD (ROUTINE X 2)  CULTURE, BLOOD (ROUTINE X 2)  TROPONIN I  LACTIC ACID, PLASMA  LACTIC ACID, PLASMA  BRAIN NATRIURETIC PEPTIDE    ____________________________________________    EKG I, Lisa Roca, MD,  the attending physician have personally viewed and interpreted all ECGs.  62 bpm.  Normal sinus rhythm.  Narrow QS per normal axis.  Nonspecific T wave ____________________________________________  RADIOLOGY   Chest x-ray two-view:  IMPRESSION: Hypoinflation with bibasilar opacification likely effusions with atelectasis, although infection is possible within the lung bases.  Stable cardiomegaly.  CT head without contrast:  IMPRESSION: 1. No evidence of acute intracranial abnormality 2. Atrophy and chronic small-vessel white matter ischemic changes. __________________________________________  PROCEDURES  Procedure(s) performed: None  Procedures  Critical Care performed: CRITICAL CARE Performed by: Lisa Roca   Total critical care time: 30 minutes  Critical care time was exclusive of separately billable procedures and treating other patients.  Critical care was necessary to treat or prevent imminent or life-threatening deterioration.  Critical care was time spent personally by me on the following activities: development of treatment plan with patient and/or surrogate as well as nursing, discussions with consultants, evaluation of patient's response to treatment, examination of patient, obtaining history from patient or surrogate, ordering and performing treatments and interventions, ordering and review of laboratory studies, ordering and review of radiographic studies, pulse oximetry and re-evaluation of patient's condition.    ____________________________________________  ED COURSE / ASSESSMENT AND PLAN  Pertinent labs & imaging results that were available during my care of the patient were reviewed by me and considered in my medical decision making (see chart for details).     Patient hypoxic by report from PCP.  I turned down the O2 and patient went to 87% fairly quickly and turned back to 2 L nasal cannula and O2 sat up to 93%.  No hypotension.  Will check x-ray  for pneumonia although he is having significant amount of wheezing, suspect bronchospasm and will treat with Solu-Medrol and DuoNeb's.  He additionally gives me history that his right arm has felt heavy sound like for several weeks now, no clear onset he is a bit of poor historian, but will check  a head CT given his symptoms concerning for possibility of stroke as a cause.   CT head reassuring.  Patient clinically consistent with CHF exacerbation.  Given IV Lasix.  He is also wheezing and was given DuoNeb and Solu-Medrol.   CONSULTATIONS: Hospitalist for admission   Patient / Family / Caregiver informed of clinical course, medical decision-making process, and agree with plan.   ___________________________________________   FINAL CLINICAL IMPRESSION(S) / ED DIAGNOSES   Final diagnoses:  Hypoxia  Bronchospasm  Acute congestive heart failure, unspecified heart failure type ([place])      ___________________________________________         Note: This dictation was prepared with Dragon dictation. Any transcriptional errors that result from this process are unintentional    Lisa Roca, MD 06/05/18 1546

## 2018-06-05 NOTE — Progress Notes (Signed)
Advanced Care Plan.  Purpose of Encounter: CODE STATUS. Parties in Attendance: The patient, RN and me. Patient's Decisional Capacity: Yes. Medical Story: Daniel [name]  is a 84 y.o. male with a known history of CKD, dementia, diabetes, hypertension, hyperlipidemia, arthritis, back pain, muscle weakness.  The patient is being admitted for acute respiratory failure with hypoxia due to new onset of CHF.  I discussed with patient about patient current condition, prognosis and CODE STATUS.  The patient wants to be resuscitated and intubated if he has cardiopulmonary arrest. Plan:  Code Status: Full code. Time spent discussing advance care planning: 17 minutes.

## 2018-06-06 ENCOUNTER — Inpatient Hospital Stay (HOSPITAL_COMMUNITY)
Admit: 2018-06-06 | Discharge: 2018-06-06 | Disposition: A | Discharge status: Home / Self Care | Payer: Medicare HMO | Attending: Internal Medicine | Admitting: Internal Medicine

## 2018-06-06 DIAGNOSIS — J9602 Acute respiratory failure with hypercapnia: Secondary | ICD-10-CM

## 2018-06-06 DIAGNOSIS — R0600 Dyspnea, unspecified: Secondary | ICD-10-CM

## 2018-06-06 LAB — BASIC METABOLIC PANEL
Anion gap: 6 (ref 5–15)
BUN: 24 mg/dL — ABNORMAL HIGH (ref 8–23)
CHLORIDE: 104 mmol/L (ref 98–111)
CO2: 32 mmol/L (ref 22–32)
Calcium: 8.9 mg/dL (ref 8.9–10.3)
Creatinine, Ser: 1.47 mg/dL — ABNORMAL HIGH (ref 0.61–1.24)
GFR calc Af Amer: 47 mL/min — ABNORMAL LOW (ref >60)
GFR, EST NON AFRICAN AMERICAN: 41 mL/min — AB (ref >60)
GLUCOSE: 245 mg/dL — AB (ref 70–99)
POTASSIUM: 4.7 mmol/L (ref 3.5–5.1)
Sodium: 142 mmol/L (ref 135–145)

## 2018-06-06 LAB — BLOOD CULTURE ID PANEL (REFLEXED)
ACINETOBACTER BAUMANNII: NOT DETECTED
CANDIDA ALBICANS: NOT DETECTED
CANDIDA TROPICALIS: NOT DETECTED
Candida glabrata: NOT DETECTED
Candida krusei: NOT DETECTED
Candida parapsilosis: NOT DETECTED
ENTEROBACTERIACEAE SPECIES: NOT DETECTED
Enterobacter cloacae complex: NOT DETECTED
Enterococcus species: NOT DETECTED
Escherichia coli: NOT DETECTED
HAEMOPHILUS INFLUENZAE: NOT DETECTED
Klebsiella oxytoca: NOT DETECTED
Klebsiella pneumoniae: NOT DETECTED
Listeria monocytogenes: NOT DETECTED
METHICILLIN RESISTANCE: NOT DETECTED
NEISSERIA MENINGITIDIS: NOT DETECTED
PSEUDOMONAS AERUGINOSA: NOT DETECTED
Proteus species: NOT DETECTED
STREPTOCOCCUS PNEUMONIAE: NOT DETECTED
STREPTOCOCCUS PYOGENES: NOT DETECTED
STREPTOCOCCUS SPECIES: NOT DETECTED
Serratia marcescens: NOT DETECTED
Staphylococcus aureus (BCID): NOT DETECTED
Staphylococcus species: DETECTED — AB
Streptococcus agalactiae: NOT DETECTED

## 2018-06-06 LAB — GLUCOSE, CAPILLARY
GLUCOSE-CAPILLARY: 199 mg/dL — AB (ref 70–99)
GLUCOSE-CAPILLARY: 175 mg/dL — AB (ref 70–99)
GLUCOSE-CAPILLARY: 62 mg/dL — AB (ref 70–99)
GLUCOSE-CAPILLARY: 143 mg/dL — AB (ref 70–99)
GLUCOSE-CAPILLARY: 142 mg/dL — AB (ref 70–99)
Glucose-Capillary: 155 mg/dL — ABNORMAL HIGH (ref 70–99)
Glucose-Capillary: 159 mg/dL — ABNORMAL HIGH (ref 70–99)

## 2018-06-06 LAB — MRSA PCR SCREENING: MRSA by PCR: NEGATIVE

## 2018-06-06 LAB — BLOOD GAS, ARTERIAL
Acid-Base Excess: 4.8 mmol/L — ABNORMAL HIGH (ref 0.0–2.0)
Bicarbonate: 34.1 mmol/L — ABNORMAL HIGH (ref 20.0–28.0)
FIO2: 0.32
O2 Saturation: 97.5 %
PATIENT TEMPERATURE: 37
pCO2 arterial: 76 mmHg (ref 32.0–48.0)
pH, Arterial: 7.26 — ABNORMAL LOW (ref 7.350–7.450)
pO2, Arterial: 110 mmHg — ABNORMAL HIGH (ref 83.0–108.0)

## 2018-06-06 LAB — CBC
HEMATOCRIT: 41.3 % (ref 39.0–52.0)
HEMOGLOBIN: 12.8 g/dL — AB (ref 13.0–17.0)
MCH: 24.3 pg — AB (ref 26.0–34.0)
MCHC: 31 g/dL (ref 30.0–36.0)
MCV: 78.4 fL — AB (ref 80.0–100.0)
Platelets: 209 10*3/uL (ref 150–400)
RBC: 5.27 MIL/uL (ref 4.22–5.81)
RDW: 15.1 % (ref 11.5–15.5)
WBC: 6.7 10*3/uL (ref 4.0–10.5)
nRBC: 0 % (ref 0.0–0.2)

## 2018-06-06 LAB — ECHOCARDIOGRAM COMPLETE
HEIGHTINCHES: 69 in
Weight: 4318.4 oz

## 2018-06-06 LAB — MAGNESIUM: MAGNESIUM: 2 mg/dL (ref 1.7–2.4)

## 2018-06-06 MED ORDER — ORAL CARE MOUTH RINSE
15.0000 mL | Freq: Two times a day (BID) | OROMUCOSAL | Status: DC
  Administered 2018-06-06 – 2018-06-09 (×5): 15 mL via OROMUCOSAL

## 2018-06-06 MED ORDER — VANCOMYCIN HCL 10 G IV SOLR
1250.0000 mg | Freq: Once | INTRAVENOUS | Status: AC
  Filled 2018-06-06 (×2): qty 1250

## 2018-06-06 MED ORDER — DEXTROSE 50 % IV SOLN
INTRAVENOUS | Status: AC
  Filled 2018-06-06: qty 50

## 2018-06-06 MED ORDER — VANCOMYCIN HCL 10 G IV SOLR
1250.0000 mg | INTRAVENOUS | Status: DC
  Administered 2018-06-06: 1250 mg via INTRAVENOUS
  Filled 2018-06-06 (×2): qty 1250

## 2018-06-06 MED ORDER — IPRATROPIUM-ALBUTEROL 0.5-2.5 (3) MG/3ML IN SOLN
3.0000 mL | Freq: Four times a day (QID) | RESPIRATORY_TRACT | Status: DC
  Administered 2018-06-06 – 2018-06-08 (×6): 3 mL via RESPIRATORY_TRACT
  Filled 2018-06-06: qty 3
  Filled 2018-06-06: qty 6
  Filled 2018-06-06 (×4): qty 3

## 2018-06-06 MED ORDER — METHYLPREDNISOLONE SODIUM SUCC 40 MG IJ SOLR
40.0000 mg | Freq: Four times a day (QID) | INTRAMUSCULAR | Status: DC
  Administered 2018-06-06 – 2018-06-07 (×5): 40 mg via INTRAVENOUS
  Filled 2018-06-06 (×5): qty 1

## 2018-06-06 MED ORDER — FUROSEMIDE 10 MG/ML IJ SOLN
40.0000 mg | Freq: Every day | INTRAMUSCULAR | Status: DC
  Administered 2018-06-07 – 2018-06-09 (×3): 40 mg via INTRAVENOUS
  Filled 2018-06-06 (×3): qty 4

## 2018-06-06 MED ORDER — DEXTROSE 50 % IV SOLN
25.0000 g | Freq: Once | INTRAVENOUS | Status: AC
  Administered 2018-06-06: 25 g via INTRAVENOUS

## 2018-06-06 NOTE — Care Management Note (Signed)
Case Management Note  Patient Details  Name: Erasmo Vertz MRN: [id number] Date of Birth: 07-20-1928  Subjective/Objective:         Patient is in the ICU for Respiratory Failure.  Patient is on Bipap, family is in the room.  Will complete assessment at a later time.  CSW following for LTC.     Doran Clay RN, BSN [phone]          Action/Plan:   Expected Discharge Date:                  Expected Discharge Plan:     In-House Referral:     Discharge planning Services  CM Consult  Post Acute Care Choice:    Choice offered to:     DME Arranged:    DME Agency:     HH Arranged:    HH Agency:     Status of Service:  In process, will continue to follow  If discussed at Long Length of Stay Meetings, dates discussed:    Additional Comments:  Shelbie Hutching, RN 06/06/2018, 3:22 PM

## 2018-06-06 NOTE — Progress Notes (Signed)
   06/06/18 2100  Clinical Encounter Type  Visited With Patient and family together  Visit Type Follow-up  Spiritual Encounters  Spiritual Needs Prayer;Emotional;Grief support;Other (Comment)  Stress Factors  Patient Stress Factors Health changes;Loss of control;Other (Comment)  Family Stress Factors Exhausted;Health changes;Loss;Major life changes;Other (Comment)  Advance Directives (For Healthcare)  Does Patient Have a Medical Advance Directive? Yes  Pt was under a ventilator. The family was upset that the SNF did not act promptly to bring pt to the hospital. Chaplain offered pt's son and ground son a safe space to vent and grief.

## 2018-06-06 NOTE — Progress Notes (Signed)
MD was made aware of patient C02 level 76 see new order , pt to be transfer to ICU , daughter at bedside .

## 2018-06-06 NOTE — Progress Notes (Signed)
[facility] Cardiology [facility] Encounter Note  Patient: Daniel [name] / Admit Date: 06/05/2018 / Date of Encounter: 06/06/2018, 3:03 PM   Subjective: This a.m. patient had continued lower extremity swelling to a significant degree as well as some abdominal distention.  He was quite somnolent at the time likely secondary to worsening hypercapnia which has required transfer to intensive care unit for further BiPAP use.  The patient has had severe lower extremity edema which could be from multiple issues including concerns of pulmonary hypertension as well as side effects of medications including amlodipine.  There appears to be some congestive heart failure as well  Review of Systems: Cannot assess due to somnolence and  Objective: Telemetry: Normal sinus rhythm Physical Exam: Blood pressure (!) 169/86, pulse 62, temperature 97.6 F (36.4 C), temperature source Oral, resp. rate (!) 24, height 5\' 9"  (1.753 m), weight 122.4 kg, SpO2 100 %. Body mass index is 39.86 kg/m. General: Well developed, well nourished, in some acute distress. Head: Normocephalic, atraumatic, sclera non-icteric, no xanthomas, nares are without discharge. Neck: No apparent masses Lungs: Normal respirations with diffuse wheezes, no rhonchi, no rales , few basilar crackles   Heart: Regular rate and rhythm, normal S1 S2, no murmur, no rub, no gallop, PMI is normal size and placement, carotid upstroke normal without bruit, jugular venous pressure normal Abdomen: Soft, non-tender, non-distended with normoactive bowel sounds. No hepatosplenomegaly. Abdominal aorta is normal size without bruit Extremities: 2+ edema, no clubbing, no cyanosis, positive ulcers,  Peripheral: 2+ radial, 0 + femoral, 0 + dorsal pedal pulses Neuro: Not alert and oriented. Moves all extremities spontaneously. Psych: Does not responds to questions appropriately with a normal affect.   Intake/Output Summary (Last 24 hours) at 06/06/2018 1503 Last data  filed at 06/06/2018 1150 Gross per 24 hour  Intake -  Output 575 ml  Net -575 ml    Inpatient Medications:  . amLODipine  10 mg Oral Daily  . atorvastatin  10 mg Oral Daily  . escitalopram  5 mg Oral Daily  . fluticasone  2 spray Each Nare QHS  . [START ON 06/07/2018] furosemide  40 mg Intravenous Daily  . heparin  5,000 Units Subcutaneous Q8H  . insulin aspart  0-5 Units Subcutaneous QHS  . insulin aspart  0-9 Units Subcutaneous TID WC  . insulin detemir  63 Units Subcutaneous Daily  . ipratropium  2 spray Each Nare TID  . ipratropium-albuterol  3 mL Nebulization Q6H  . mouth rinse  15 mL Mouth Rinse BID  . memantine  28 mg Oral Daily  . methylPREDNISolone (SOLU-MEDROL) injection  40 mg Intravenous Q6H  . metoprolol succinate  37.5 mg Oral BID  . polyethylene glycol  17 g Oral Daily  . senna-docusate  2 tablet Oral BID  . sodium chloride flush  3 mL Intravenous Q12H  . tiZANidine  2 mg Oral BID   Infusions:  . sodium chloride    . vancomycin    . vancomycin      Labs: Recent Labs    06/05/18 1428 06/06/18 0422  NA 140 142  K 3.7 4.7  CL 102 104  CO2 29 32  GLUCOSE 171* 245*  BUN 18 24*  CREATININE 1.33* 1.47*  CALCIUM 8.9 8.9  MG  --  2.0   Recent Labs    06/05/18 1428  AST 17  ALT 15  ALKPHOS 107  BILITOT 0.6  PROT 8.4*  ALBUMIN 3.5   Recent Labs    06/05/18 1428 06/06/18  0422  WBC 6.6 6.7  NEUTROABS 3.2  --   HGB 12.0* 12.8*  HCT 38.7* 41.3  MCV 78.8* 78.4*  PLT 186 209   Recent Labs    06/05/18 1428  TROPONINI <0.03   Invalid input(s): POCBNP No results for input(s): HGBA1C in the last 72 hours.   Weights: Filed Weights   06/05/18 1310 06/05/18 1823 06/06/18 0448  Weight: 124.7 kg 120.8 kg 122.4 kg     Radiology/Studies:  Dg Chest 2 View  Result Date: 06/05/2018 CLINICAL DATA:  Increase shortness-of-breath 1 week. Oxygen dependent. EXAM: CHEST - 2 VIEW COMPARISON:  05/10/2016 FINDINGS: Lungs are hypoinflated with mild stable  elevation of the left hemidiaphragm. Mild bibasilar opacification is present which may be due to effusions with atelectasis versus infection. Stable cardiomegaly. Remainder of the exam is unchanged. IMPRESSION: Hypoinflation with bibasilar opacification likely effusions with atelectasis, although infection is possible within the lung bases. Stable cardiomegaly. Electronically Signed   By: Marin Olp M.D.   On: 06/05/2018 13:54   Ct Head Wo Contrast  Result Date: 06/05/2018 CLINICAL DATA:  84 year old male with RIGHT arm weakness and recent falls. EXAM: CT HEAD WITHOUT CONTRAST TECHNIQUE: Contiguous axial images were obtained from the base of the skull through the vertex without intravenous contrast. COMPARISON:  01/29/2012 CT FINDINGS: Brain: No evidence of acute infarction, hemorrhage, hydrocephalus, extra-axial collection or mass lesion/mass effect. Atrophy and chronic small-vessel white matter ischemic changes again noted. Vascular: Intracranial atherosclerotic calcifications again noted. Skull: Normal. Negative for fracture or focal lesion. Sinuses/Orbits: No acute finding. Other: None. IMPRESSION: 1. No evidence of acute intracranial abnormality 2. Atrophy and chronic small-vessel white matter ischemic changes. Electronically Signed   By: Margarette Canada M.D.   On: 06/05/2018 14:19     Assessment and Recommendation  84 y.o. male with significant dementia previous apparent myocardial infarction diabetes hypertension hyperlipidemia with severe lower extremity edema multifactorial in nature including medications pulmonary hypertension and dependent edema now with hypercapnia requiring further oxygenation 1.  Supportive care for hypercapnia and/or hypoxia and respiratory failure 2.  Possible discontinuation of amlodipine which may exacerbate lower extremity edema and ulcers 3.  Some treatment with diuresis for lower extremity edema as able 4.  Echocardiogram pending 5.  Further treatment options after  above  Signed, Serafina Royals M.D. FACC

## 2018-06-06 NOTE — Consult Note (Signed)
Pharmacy Antibiotic Note  Daniel [name] is a 84 y.o. male admitted on 06/05/2018 with bacteremia. Presented with leg swelling, SOB, wheezing, cough. Patient has CKD. Pharmacy has been consulted for Vancomycin dosing.  Plan: Ordered Vancomycin 1250 mg IV x 1. Will order 1250 mg IV q24 h 6 hours after stack dose. Will order level prior to 4th dose on 11/7 @ 2030. Goal trough 15-20.   Vancomycin Kinetics Using adjusted BW 91.4 kg and CrCl 37 mL/min ke 0.035 Vd 64.0 L T1/2 19.8 hrs  Height: 5\' 9"  (175.3 cm) Weight: 269 lb 14.4 oz (122.4 kg) IBW/kg (Calculated) : 70.7  Temp (24hrs), Avg:97.7 F (36.5 C), Min:97.5 F (36.4 C), Max:97.8 F (36.6 C)  Recent Labs  Lab 06/05/18 1428 06/05/18 1608 06/06/18 0422  WBC 6.6  --  6.7  CREATININE 1.33*  --  1.47*  LATICACIDVEN 0.9 1.0  --     Estimated Creatinine Clearance: 44.9 mL/min (A) (by C-G formula based on SCr of 1.47 mg/dL (H)).    Allergies  Allergen Reactions  . Oxycodone Nausea And Vomiting  . Codeine     Antimicrobials this admission: 11/5 Vancomycin >>   Dose adjustments this admission: N/A  Microbiology results: 11/4 BCx: 2/4 GPCs (both aerobic and anaerobic bottles) 11/5 MRSA PCR: sent  Thank you for allowing pharmacy to be a part of this patient's care.  Paticia Stack, PharmD Pharmacy Resident  06/06/2018 2:15 PM

## 2018-06-06 NOTE — Progress Notes (Signed)
PHARMACY - PHYSICIAN COMMUNICATION CRITICAL VALUE ALERT - BLOOD CULTURE IDENTIFICATION (BCID)  Daniel [name] is an 84 y.o. male who presented to [facility] on 06/05/2018 with a chief complaint of leg swelling/SOB  Assessment:  Pt is a 84 year old male who presents with SOB/leg swelling. WBC WNL, afebrile. Chest x-ray w/ Hypoinflation with bibasilar opacification likely effusions with atelectasis, although infection is possible within the lung bases. Bcx is most likely a contaminate  Name of physician (or Provider) Contacted: Sainani  Current antibiotics: none  Changes to prescribed antibiotics recommended:  Discussed with MD who wanted to hold off on abx for now  Results for orders placed or performed during the hospital encounter of 06/05/18  Blood Culture ID Panel (Reflexed) (Collected: 06/05/2018  2:28 PM)  Result Value Ref Range   Enterococcus species NOT DETECTED NOT DETECTED   Listeria monocytogenes NOT DETECTED NOT DETECTED   Staphylococcus species DETECTED (A) NOT DETECTED   Staphylococcus aureus (BCID) NOT DETECTED NOT DETECTED   Methicillin resistance NOT DETECTED NOT DETECTED   Streptococcus species NOT DETECTED NOT DETECTED   Streptococcus agalactiae NOT DETECTED NOT DETECTED   Streptococcus pneumoniae NOT DETECTED NOT DETECTED   Streptococcus pyogenes NOT DETECTED NOT DETECTED   Acinetobacter baumannii NOT DETECTED NOT DETECTED   Enterobacteriaceae species NOT DETECTED NOT DETECTED   Enterobacter cloacae complex NOT DETECTED NOT DETECTED   Escherichia coli NOT DETECTED NOT DETECTED   Klebsiella oxytoca NOT DETECTED NOT DETECTED   Klebsiella pneumoniae NOT DETECTED NOT DETECTED   Proteus species NOT DETECTED NOT DETECTED   Serratia marcescens NOT DETECTED NOT DETECTED   Haemophilus influenzae NOT DETECTED NOT DETECTED   Neisseria meningitidis NOT DETECTED NOT DETECTED   Pseudomonas aeruginosa NOT DETECTED NOT DETECTED   Candida albicans NOT DETECTED NOT DETECTED   Candida glabrata NOT DETECTED NOT DETECTED   Candida krusei NOT DETECTED NOT DETECTED   Candida parapsilosis NOT DETECTED NOT DETECTED   Candida tropicalis NOT DETECTED NOT DETECTED    [name] Daniel [name] 06/06/2018  7:50 AM

## 2018-06-06 NOTE — Progress Notes (Signed)
Daughter Arvle Grabe given patients grey metal watch.

## 2018-06-06 NOTE — Progress Notes (Signed)
[facility] [place] NAME: Daniel [name]    MR#:  [id number]  DATE OF BIRTH:  23-Apr-1928  SUBJECTIVE:   Patient here due to shortness of breath and suspected to have CHF.  Patient was somewhat altered and lethargic this morning but had a CT head overnight which was negative, ABG obtained showing acute respiratory failure with hypercapnia and patient transferred to intensive care unit placed on BiPAP.  REVIEW OF SYSTEMS:    Review of Systems  Unable to perform ROS: Dementia    Nutrition: Heart Healthy/Carb modified Tolerating Diet: Yes Tolerating PT:  Pt. Is bedbound at baseline.   DRUG ALLERGIES:   Allergies  Allergen Reactions  . Oxycodone Nausea And Vomiting  . Codeine     VITALS:  Blood pressure 120/64, pulse (!) 49, temperature 97.6 F (36.4 C), temperature source Axillary, resp. rate 15, height 5\' 9"  (1.753 m), weight 122.4 kg, SpO2 96 %.  PHYSICAL EXAMINATION:   Physical Exam  GENERAL:  84 y.o.-year-old obese patient lying in bed lethargic/encephalopathic EYES: Pupils equal, round, reactive to light. No scleral icterus.  HEENT: Head atraumatic, normocephalic. Oropharynx and nasopharynx clear.  NECK:  Supple, no jugular venous distention. No thyroid enlargement, no tenderness.  LUNGS: Normal breath sounds bilaterally, no wheezing, rales, rhonchi. No use of accessory muscles of respiration.  CARDIOVASCULAR: S1, S2 normal. No murmurs, rubs, or gallops.  ABDOMEN: Soft, nontender, nondistended. Bowel sounds present. No organomegaly or mass.  EXTREMITIES: No cyanosis, clubbing or edema b/l.    NEUROLOGIC: Cranial nerves II through XII are intact. No focal Motor or sensory deficits b/l. Globally weak.    PSYCHIATRIC: The patient is alert and oriented x 1. encephalopathic SKIN: No obvious rash, lesion, or ulcer.    LABORATORY PANEL:   CBC Recent Labs  Lab 06/06/18 0422  WBC 6.7  HGB 12.8*  HCT 41.3  PLT 209    ------------------------------------------------------------------------------------------------------------------  Chemistries  Recent Labs  Lab 06/05/18 1428 06/06/18 0422  NA 140 142  K 3.7 4.7  CL 102 104  CO2 29 32  GLUCOSE 171* 245*  BUN 18 24*  CREATININE 1.33* 1.47*  CALCIUM 8.9 8.9  MG  --  2.0  AST 17  --   ALT 15  --   ALKPHOS 107  --   BILITOT 0.6  --    ------------------------------------------------------------------------------------------------------------------  Cardiac Enzymes Recent Labs  Lab 06/05/18 1428  TROPONINI <0.03   ------------------------------------------------------------------------------------------------------------------  RADIOLOGY:  Dg Chest 2 View  Result Date: 06/05/2018 CLINICAL DATA:  Increase shortness-of-breath 1 week. Oxygen dependent. EXAM: CHEST - 2 VIEW COMPARISON:  05/10/2016 FINDINGS: Lungs are hypoinflated with mild stable elevation of the left hemidiaphragm. Mild bibasilar opacification is present which may be due to effusions with atelectasis versus infection. Stable cardiomegaly. Remainder of the exam is unchanged. IMPRESSION: Hypoinflation with bibasilar opacification likely effusions with atelectasis, although infection is possible within the lung bases. Stable cardiomegaly. Electronically Signed   By: Marin Olp M.D.   On: 06/05/2018 13:54   Ct Head Wo Contrast  Result Date: 06/05/2018 CLINICAL DATA:  84 year old male with RIGHT arm weakness and recent falls. EXAM: CT HEAD WITHOUT CONTRAST TECHNIQUE: Contiguous axial images were obtained from the base of the skull through the vertex without intravenous contrast. COMPARISON:  01/29/2012 CT FINDINGS: Brain: No evidence of acute infarction, hemorrhage, hydrocephalus, extra-axial collection or mass lesion/mass effect. Atrophy and chronic small-vessel white matter ischemic changes again noted. Vascular: Intracranial atherosclerotic calcifications  again noted. Skull:  Normal. Negative for fracture or focal lesion. Sinuses/Orbits: No acute finding. Other: None. IMPRESSION: 1. No evidence of acute intracranial abnormality 2. Atrophy and chronic small-vessel white matter ischemic changes. Electronically Signed   By: Margarette Canada M.D.   On: 06/05/2018 14:19     ASSESSMENT AND PLAN:   84 year old male with past medical history of hypertension, hyperlipidemia, diabetes, dementia, osteoarthritis who presented to the hospital due to shortness of breath.  1.  Acute respiratory failure with hypercapnia-secondary to possible underlying sleep apnea with mild CHF.   -this morning patient was somewhat altered and lethargic ABG obtained showing significant hypercapnia and patient transferred to ICU placed on BiPAP. - Follow ABGs, clinically.  2.  CHF-acute on chronic diastolic dysfunction. -Clinically improved, continue diuresis with IV Lasix but creatinine rising therefore will lower IV Lasix dose.  Follow BUN and creatinine urine output, follow daily weights I's and O's. -Continue Toprol.  3.  COPD- mild acute exacerbation given the hypercapnic respiratory failure. -Transferred to stepdown placed on BiPAP, started on IV steroids, scheduled duo nebs.  4.  Diabetes type 2 without complication-continue sliding scale insulin, follow blood sugars are currently stable.  5.  Essential hypertension-continue Toprol, Norvasc.  6.  Dementia-continue Namenda.  7.  Depression-continue Lexapro.  8.  Positive blood cultures -patient blood cultures are positive for coagulation negative staph which is likely a skin contaminant. - We can likely DC vancomycin.       All the records are reviewed and case discussed with Care Management/Social Worker. Management plans discussed with the patient, family and they are in agreement.  CODE STATUS: Full code  DVT Prophylaxis: Hep. SQ  TOTAL Critical Care TIME TAKING CARE OF THIS PATIENT: 35 minutes.   POSSIBLE D/C IN 2-3 DAYS,  DEPENDING ON CLINICAL CONDITION.   Henreitta Leber M.D on 06/06/2018 at 5:00 PM  Between 7am to 6pm - Pager - [phone]  After 6pm go to [email or web address] - Proofreader  [facility]  Office  [phone]  CC: Primary care physician; Glendale Chard, MD

## 2018-06-06 NOTE — Progress Notes (Signed)
Patients family only wants immediate family present (daughters and son) during updates or social worker visits.

## 2018-06-06 NOTE — Progress Notes (Signed)
Pharmacy Electrolyte and Glucose Monitoring Consult:  Pharmacy consulted to assist in monitoring and replacing electrolytes and glucose management in this 84 y.o. male admitted on 06/05/2018 with hypercapnic respiratory failure. Patient transferred to ICU on 11/5.   Labs:  Sodium (mmol/L)  Date Value  06/06/2018 142  11/20/2012 135 (L)   Potassium (mmol/L)  Date Value  06/06/2018 4.7  11/20/2012 3.7   Magnesium (mg/dL)  Date Value  06/06/2018 2.0   Calcium (mg/dL)  Date Value  06/06/2018 8.9   Calcium, Total (mg/dL)  Date Value  11/20/2012 8.9   Albumin (g/dL)  Date Value  06/05/2018 3.5  11/20/2012 3.5    Assessment/Plan: Glucose: Patient initiated on SSI. Patient received Levemir 63 units at 2304 on 11/5 and Levemir 63 units at [facility] [place] on 11/5. Discussed with ICU RN and will continue to monitor for signs and symptoms of hypoglycemia. Last blood glucose 175. Will discontinue Levemir order and assess blood glucose in am. Patient is ordered methylprednisolone 40mg  IV Q6hr. Will continue to monitor.   Electrolytes: patient ordered furosemide 40mg  IV daily; scheduled to start on 11/6. Patient received furosemide 40mg  IV once on 11/4. Will order BMP with am labs.   Pharmacy will continue to monitor and adjust per consult.    Gwendelyn Lanting L 06/06/2018 3:58 PM

## 2018-06-06 NOTE — Progress Notes (Signed)
Patients daughters, Maudie Mercury [facility]) and Madaline Savage would like to be called by the social worker related to the patient not returning to [facility]. The family prefers [facility] in [place] or [employer].

## 2018-06-06 NOTE — Progress Notes (Signed)
*  PRELIMINARY RESULTS* Echocardiogram 2D Echocardiogram has been performed.  Daniel [name] 06/06/2018, 12:29 PM

## 2018-06-06 NOTE — Consult Note (Signed)
Reason for Consult: Hypercapnic respiratory failure Referring Physician: Dr. Purnell Shoemaker [name] is an 84 y.o. male.  HPI: Mr. Daniel [name] is an 84 year old gentleman with a past medical history remarkable for hypertension, hyperlipidemia, diabetes, dementia, chronic renal disease, chronic back pain with underlying history of T5-T6 vertebral fracture, presents to the emergency department on 11/4 with worsening shortness of breath.  Per report patient had leg swelling for approximately 3 weeks, complaining of wheezing and cough.  He was found to be hypoxemic in the emergency department, was started on nasal cannula, chest x-ray revealed hyperinflation, was felt to have congestive heart failure given IV Lasix and admitted to the floor.  Was called today secondary to decreased level of consciousness, arterial blood gas revealed acute hypercapnic respiratory failure.  Patient will be started on BiPAP and moved to the intensive care unit.   Past Medical History:  Diagnosis Date  . Arthritis   . Chronic kidney disease   . Dementia ([place])   . Diabetes mellitus without complication (HCC)    Type 2  . Hyperlipidemia   . Hypertension   . Low back pain   . Muscle weakness (generalized)   . Other fracture of t5-T6 vertebra, subsequent encounter for fracture with routine healing   . Polyosteoarthritis   . Renal disorder   . Type 2 diabetes mellitus ([place])   . Unspecified fracture of t5-T6 vertebra, subsequent encounter for fracture with routine healing     Past Surgical History:  Procedure Laterality Date  . BACK SURGERY    . CHOLECYSTECTOMY    . HERNIA REPAIR    . KNEE SURGERY    . teeth pulled  Bilateral 08/13/14   teeth pulled and bottom gum removed    Family History  Problem Relation Age of Onset  . Hypertension Father     Social History:  reports that he has never smoked. He has never used smokeless tobacco. He reports that he does not drink alcohol. His drug history is not on  file.  Allergies:  Allergies  Allergen Reactions  . Oxycodone Nausea And Vomiting  . Codeine     Medications: I have reviewed the patient's current medications.  Results for orders placed or performed during the hospital encounter of 06/05/18 (from the past 48 hour(s))  CBC with Differential     Status: Abnormal   Collection Time: 06/05/18  2:28 PM  Result Value Ref Range   WBC 6.6 4.0 - 10.5 K/uL   RBC 4.91 4.22 - 5.81 MIL/uL   Hemoglobin 12.0 (L) 13.0 - 17.0 g/dL   HCT 38.7 (L) 39.0 - 52.0 %   MCV 78.8 (L) 80.0 - 100.0 fL   MCH 24.4 (L) 26.0 - 34.0 pg   MCHC 31.0 30.0 - 36.0 g/dL   RDW 15.2 11.5 - 15.5 %   Platelets 186 150 - 400 K/uL   nRBC 0.0 0.0 - 0.2 %   Neutrophils Relative % 48 %   Neutro Abs 3.2 1.7 - 7.7 K/uL   Lymphocytes Relative 36 %   Lymphs Abs 2.4 0.7 - 4.0 K/uL   Monocytes Relative 13 %   Monocytes Absolute 0.8 0.1 - 1.0 K/uL   Eosinophils Relative 3 %   Eosinophils Absolute 0.2 0.0 - 0.5 K/uL   Basophils Relative 0 %   Basophils Absolute 0.0 0.0 - 0.1 K/uL   Immature Granulocytes 0 %   Abs Immature Granulocytes 0.02 0.00 - 0.07 K/uL    Comment: Performed at [facility], [address] [place], [place] [address]  Troponin I     Status: None   Collection Time: 06/05/18  2:28 PM  Result Value Ref Range   Troponin I <0.03 <0.03 ng/mL    Comment: Performed at [facility], [address], [place] [address]  Comprehensive metabolic panel     Status: Abnormal   Collection Time: 06/05/18  2:28 PM  Result Value Ref Range   Sodium 140 135 - 145 mmol/L   Potassium 3.7 3.5 - 5.1 mmol/L   Chloride 102 98 - 111 mmol/L   CO2 29 22 - 32 mmol/L   Glucose, Bld 171 (H) 70 - 99 mg/dL   BUN 18 8 - 23 mg/dL   Creatinine, Ser 1.33 (H) 0.61 - 1.24 mg/dL   Calcium 8.9 8.9 - 10.3 mg/dL   Total Protein 8.4 (H) 6.5 - 8.1 g/dL   Albumin 3.5 3.5 - 5.0 g/dL   AST 17 15 - 41 U/L   ALT 15 0 - 44 U/L   Alkaline Phosphatase 107 38 - 126 U/L    Total Bilirubin 0.6 0.3 - 1.2 mg/dL   GFR calc non Af Amer 46 (L) >60 mL/min   GFR calc Af Amer 53 (L) >60 mL/min    Comment: (NOTE) The eGFR has been calculated using the CKD EPI equation. This calculation has not been validated in all clinical situations. eGFR's persistently <60 mL/min signify possible Chronic Kidney Disease.    Anion gap 9 5 - 15    Comment: Performed at [facility], [place], [place] [address]  Lactic acid, plasma     Status: None   Collection Time: 06/05/18  2:28 PM  Result Value Ref Range   Lactic Acid, Venous 0.9 0.5 - 1.9 mmol/L    Comment: Performed at [facility], [address] [place], [place] [address]  Culture, blood (routine x 2)     Status: None (Preliminary result)   Collection Time: 06/05/18  2:28 PM  Result Value Ref Range   Specimen Description BLOOD BLOOD LEFT HAND    Special Requests      BOTTLES DRAWN AEROBIC AND ANAEROBIC Blood Culture adequate volume   Culture      NO GROWTH < 24 HOURS Performed at [facility], [address] [place], [place] [address]    Report Status PENDING   Culture, blood (routine x 2)     Status: None (Preliminary result)   Collection Time: 06/05/18  2:28 PM  Result Value Ref Range   Specimen Description      BLOOD RIGHT ANTECUBITAL Performed at [facility], [address] [place], [place] [address]    Special Requests      BOTTLES DRAWN AEROBIC AND ANAEROBIC Blood Culture adequate volume Performed at [facility], [address] [place],  [address]    Culture  Setup Time      GRAM POSITIVE COCCI IN BOTH AEROBIC AND ANAEROBIC BOTTLES CRITICAL RESULT CALLED TO, READ BACK BY AND VERIFIED WITH: Yarnell AT 5456 ON 06/06/18 [place]. Performed at [facility], [place] [address], [place],  [address]    Culture GRAM POSITIVE COCCI    Report Status PENDING   Blood Culture ID Panel (Reflexed)     Status: Abnormal   Collection  Time: 06/05/18  2:28 PM  Result Value Ref Range   Enterococcus species NOT DETECTED NOT DETECTED   Listeria monocytogenes NOT DETECTED NOT DETECTED  Staphylococcus species DETECTED (A) NOT DETECTED    Comment: Methicillin (oxacillin) susceptible coagulase negative staphylococcus. Possible blood culture contaminant (unless isolated from more than one blood culture draw or clinical case suggests pathogenicity). No antibiotic treatment is indicated for blood  culture contaminants. CRITICAL RESULT CALLED TO, READ BACK BY AND VERIFIED WITH: DAVID BESANTI AT 2947 ON 06/06/18 [place].    Staphylococcus aureus (BCID) NOT DETECTED NOT DETECTED   Methicillin resistance NOT DETECTED NOT DETECTED   Streptococcus species NOT DETECTED NOT DETECTED   Streptococcus agalactiae NOT DETECTED NOT DETECTED   Streptococcus pneumoniae NOT DETECTED NOT DETECTED   Streptococcus pyogenes NOT DETECTED NOT DETECTED   Acinetobacter baumannii NOT DETECTED NOT DETECTED   Enterobacteriaceae species NOT DETECTED NOT DETECTED   Enterobacter cloacae complex NOT DETECTED NOT DETECTED   Escherichia coli NOT DETECTED NOT DETECTED   Klebsiella oxytoca NOT DETECTED NOT DETECTED   Klebsiella pneumoniae NOT DETECTED NOT DETECTED   Proteus species NOT DETECTED NOT DETECTED   Serratia marcescens NOT DETECTED NOT DETECTED   Haemophilus influenzae NOT DETECTED NOT DETECTED   Neisseria meningitidis NOT DETECTED NOT DETECTED   Pseudomonas aeruginosa NOT DETECTED NOT DETECTED   Candida albicans NOT DETECTED NOT DETECTED   Candida glabrata NOT DETECTED NOT DETECTED   Candida krusei NOT DETECTED NOT DETECTED   Candida parapsilosis NOT DETECTED NOT DETECTED   Candida tropicalis NOT DETECTED NOT DETECTED    Comment: Performed at [facility], [address] [place], [place] [address]  Lactic acid, plasma     Status: None   Collection Time: 06/05/18  4:08 PM  Result Value Ref Range   Lactic Acid, Venous 1.0 0.5 - 1.9 mmol/L     Comment: Performed at [facility], [address] [place], [place] [address]  Brain natriuretic peptide     Status: None   Collection Time: 06/05/18  4:08 PM  Result Value Ref Range   B Natriuretic Peptide 43.0 0.0 - 100.0 pg/mL    Comment: Performed at [facility], [address] [place], [place] [address]  Glucose, capillary     Status: Abnormal   Collection Time: 06/05/18  8:38 PM  Result Value Ref Range   Glucose-Capillary 225 (H) 70 - 99 mg/dL  Basic metabolic panel     Status: Abnormal   Collection Time: 06/06/18  4:22 AM  Result Value Ref Range   Sodium 142 135 - 145 mmol/L   Potassium 4.7 3.5 - 5.1 mmol/L   Chloride 104 98 - 111 mmol/L   CO2 32 22 - 32 mmol/L   Glucose, Bld 245 (H) 70 - 99 mg/dL   BUN 24 (H) 8 - 23 mg/dL   Creatinine, Ser 1.47 (H) 0.61 - 1.24 mg/dL   Calcium 8.9 8.9 - 10.3 mg/dL   GFR calc non Af Amer 41 (L) >60 mL/min   GFR calc Af Amer 47 (L) >60 mL/min    Comment: (NOTE) The eGFR has been calculated using the CKD EPI equation. This calculation has not been validated in all clinical situations. eGFR's persistently <60 mL/min signify possible Chronic Kidney Disease.    Anion gap 6 5 - 15    Comment: Performed at [facility], [address] [place], [place] [address]  CBC     Status: Abnormal   Collection Time: 06/06/18  4:22 AM  Result Value Ref Range   WBC 6.7 4.0 - 10.5 K/uL   RBC 5.27 4.22 - 5.81 MIL/uL   Hemoglobin 12.8 (L)  13.0 - 17.0 g/dL   HCT 41.3 39.0 - 52.0 %   MCV 78.4 (L) 80.0 - 100.0 fL   MCH 24.3 (L) 26.0 - 34.0 pg   MCHC 31.0 30.0 - 36.0 g/dL   RDW 15.1 11.5 - 15.5 %   Platelets 209 150 - 400 K/uL   nRBC 0.0 0.0 - 0.2 %    Comment: Performed at [facility], [address] [place], [place] [address]  Magnesium     Status: None   Collection Time: 06/06/18  4:22 AM  Result Value Ref Range   Magnesium 2.0 1.7 - 2.4 mg/dL    Comment: Performed at [facility], [address] [place], [place] [address]  Glucose, capillary     Status: Abnormal   Collection Time: 06/06/18  7:57 AM  Result Value Ref Range   Glucose-Capillary 155 (H) 70 - 99 mg/dL   Comment 1 Notify RN    Comment 2 Document in Chart   Blood gas, arterial     Status: Abnormal   Collection Time: 06/06/18 11:23 AM  Result Value Ref Range   FIO2 0.32    Delivery systems NASAL CANNULA    pH, Arterial 7.26 (L) 7.350 - 7.450   pCO2 arterial 76 (HH) 32.0 - 48.0 mmHg    Comment: CRITICAL RESULT CALLED TO, READ BACK BY AND VERIFIED WITH: NOTIFIED AGlennon Mac, RN OF THE CRITICAL VALUE ON 06/06/2018 AT 11:43. DW, RRT.    pO2, Arterial 110 (H) 83.0 - 108.0 mmHg   Bicarbonate 34.1 (H) 20.0 - 28.0 mmol/L   Acid-Base Excess 4.8 (H) 0.0 - 2.0 mmol/L   O2 Saturation 97.5 %   Patient temperature 37.0    Collection site RIGHT RADIAL    Sample type ARTERIAL DRAW    Allens test (pass/fail) PASS PASS    Comment: Performed at [facility], [address], [place], [place] [address]    Dg Chest 2 View  Result Date: 06/05/2018 CLINICAL DATA:  Increase shortness-of-breath 1 week. Oxygen dependent. EXAM: CHEST - 2 VIEW COMPARISON:  05/10/2016 FINDINGS: Lungs are hypoinflated with mild stable elevation of the left hemidiaphragm. Mild bibasilar opacification is present which may be due to effusions with atelectasis versus infection. Stable cardiomegaly. Remainder of the exam is unchanged. IMPRESSION: Hypoinflation with bibasilar opacification likely effusions with atelectasis, although infection is possible within the lung bases. Stable cardiomegaly. Electronically Signed   By: Marin Olp M.D.   On: 06/05/2018 13:54   Ct Head Wo Contrast  Result Date: 06/05/2018 CLINICAL DATA:  84 year old male with RIGHT arm weakness and recent falls. EXAM: CT HEAD WITHOUT CONTRAST TECHNIQUE: Contiguous axial images were obtained from the base of the skull through the vertex without intravenous contrast. COMPARISON:   01/29/2012 CT FINDINGS: Brain: No evidence of acute infarction, hemorrhage, hydrocephalus, extra-axial collection or mass lesion/mass effect. Atrophy and chronic small-vessel white matter ischemic changes again noted. Vascular: Intracranial atherosclerotic calcifications again noted. Skull: Normal. Negative for fracture or focal lesion. Sinuses/Orbits: No acute finding. Other: None. IMPRESSION: 1. No evidence of acute intracranial abnormality 2. Atrophy and chronic small-vessel white matter ischemic changes. Electronically Signed   By: Margarette Canada M.D.   On: 06/05/2018 14:19    ROS  Please see HPI for pertinent review of systems.  Difficult to obtain at this time as patient is somnolent with respiratory distress  Blood pressure (!) 169/86, pulse 62, temperature 97.6 F (36.4 C), temperature source Oral, resp. rate 20, height _0  (  1.753 m), weight 122.4 kg, SpO2 100 %.   Physical Exam  Elderly appearing male, obese, somnolent but arousable   HEENT: Head atraumatic, normocephalic. Oropharynx and nasopharynx clear.  NECK:  Supple, no jugular venous distention. No thyroid enlargement, no tenderness.  LUNGS: Mild wheezing, bibasilar rales CARDIOVASCULAR: S1, S2 normal. No murmurs, rubs, or gallops.  ABDOMEN: Soft, nontender, nondistended. Bowel sounds present. No organomegaly or mass.  EXTREMITIES: Bilateral leg edema 2+.  No cyanosis, or clubbing.  NEUROLOGIC: Cranial nerves II through XII are intact. PSYCHIATRIC: The patient is alert and oriented x 3.  SKIN: No obvious rash, lesion, or ulcer  Assessment/Plan:  Acute on chronic hypercapnic respiratory failure.  Chest x-ray reveals cardiomegaly, elevated left hemidiaphragm, congestive heart failure.  Patient has been seen by cardiology patient will be moved to the intensive care unit, started on noninvasive ventilation.  Patient will be placed on albuterol, Atrovent  Sepsis.  Patient with positive gram-positive cocci in blood with staph species.   Will place on vancomycin  Hyperglycemia/diabetes mellitus.  Placed on sliding scale  Renal insufficiency with BUN 24/creatinine 1.47  Julian Medina 06/06/2018, 11:48 AM

## 2018-06-06 NOTE — Clinical Social Work Note (Signed)
CSW received consult that patient is a long term care resident at [facility].  CSW was informed by case manager that patient's family does not want him to return back to SNF.  CSW to complete assessment at a later time.  Jones Broom. [place], MSW, [place]  06/06/2018 12:25 PM

## 2018-06-07 LAB — CBC
HCT: 38.6 % — ABNORMAL LOW (ref 39.0–52.0)
Hemoglobin: 12 g/dL — ABNORMAL LOW (ref 13.0–17.0)
MCH: 24.2 pg — AB (ref 26.0–34.0)
MCHC: 31.1 g/dL (ref 30.0–36.0)
MCV: 78 fL — ABNORMAL LOW (ref 80.0–100.0)
NRBC: 0 % (ref 0.0–0.2)
PLATELETS: 202 10*3/uL (ref 150–400)
RBC: 4.95 MIL/uL (ref 4.22–5.81)
RDW: 15.2 % (ref 11.5–15.5)
WBC: 10.2 10*3/uL (ref 4.0–10.5)

## 2018-06-07 LAB — MAGNESIUM: MAGNESIUM: 2.3 mg/dL (ref 1.7–2.4)

## 2018-06-07 LAB — BASIC METABOLIC PANEL
ANION GAP: 7 (ref 5–15)
BUN: 29 mg/dL — ABNORMAL HIGH (ref 8–23)
CALCIUM: 9 mg/dL (ref 8.9–10.3)
CO2: 33 mmol/L — ABNORMAL HIGH (ref 22–32)
CREATININE: 1.27 mg/dL — AB (ref 0.61–1.24)
Chloride: 104 mmol/L (ref 98–111)
GFR, EST AFRICAN AMERICAN: 56 mL/min — AB (ref >60)
GFR, EST NON AFRICAN AMERICAN: 49 mL/min — AB (ref >60)
Glucose, Bld: 150 mg/dL — ABNORMAL HIGH (ref 70–99)
Potassium: 4.5 mmol/L (ref 3.5–5.1)
Sodium: 144 mmol/L (ref 135–145)

## 2018-06-07 LAB — GLUCOSE, CAPILLARY
GLUCOSE-CAPILLARY: 294 mg/dL — AB (ref 70–99)
Glucose-Capillary: 146 mg/dL — ABNORMAL HIGH (ref 70–99)
Glucose-Capillary: 137 mg/dL — ABNORMAL HIGH (ref 70–99)
Glucose-Capillary: 223 mg/dL — ABNORMAL HIGH (ref 70–99)
Glucose-Capillary: 195 mg/dL — ABNORMAL HIGH (ref 70–99)

## 2018-06-07 LAB — PROCALCITONIN: Procalcitonin: <0.1 ng/mL

## 2018-06-07 MED ORDER — INSULIN DETEMIR 100 UNIT/ML ~~LOC~~ SOLN
63.0000 [IU] | Freq: Every day | SUBCUTANEOUS | Status: DC
  Filled 2018-06-07 (×3): qty 0.63

## 2018-06-07 MED ORDER — INFLUENZA VAC SPLIT HIGH-DOSE 0.5 ML IM SUSY
0.5000 mL | PREFILLED_SYRINGE | INTRAMUSCULAR | Status: DC
  Filled 2018-06-07: qty 0.5

## 2018-06-07 MED ORDER — INSULIN DETEMIR 100 UNIT/ML ~~LOC~~ SOLN
63.0000 [IU] | Freq: Every day | SUBCUTANEOUS | Status: DC
  Administered 2018-06-07: 63 [IU] via SUBCUTANEOUS
  Filled 2018-06-07 (×5): qty 0.63

## 2018-06-07 MED ORDER — METHYLPREDNISOLONE SODIUM SUCC 40 MG IJ SOLR
40.0000 mg | Freq: Two times a day (BID) | INTRAMUSCULAR | Status: DC
  Administered 2018-06-08 – 2018-06-09 (×4): 40 mg via INTRAVENOUS
  Filled 2018-06-07 (×4): qty 1

## 2018-06-07 MED ORDER — PNEUMOCOCCAL VAC POLYVALENT 25 MCG/0.5ML IJ INJ
0.5000 mL | INJECTION | INTRAMUSCULAR | Status: DC
  Filled 2018-06-07: qty 0.5

## 2018-06-07 NOTE — Progress Notes (Addendum)
Patient transferred from CCU.Received report from [place], Therapist, sports. Oriented to room, call bell within reach. Bed in lowest position. Fall safety plan reviewed. Telemetry box verification with tele clerk- Box#: [id number]. Daughter at bedside.Will continue to monitor.

## 2018-06-07 NOTE — Progress Notes (Signed)
Follow up - Critical Care Medicine Note  Patient Details:    Daniel [name] is an 84 y.o. male.with a past medical history remarkable for hypertension, hyperlipidemia, diabetes, dementia, chronic renal disease, chronic back pain with underlying history of T5-T6 vertebral fracture, presents to the emergency department on 11/4 with worsening shortness of breath.  Per report patient had leg swelling for approximately 3 weeks, complaining of wheezing and cough.  He was found to be hypoxemic in the emergency department, was started on nasal cannula, chest x-ray revealed hyperinflation, was felt to have congestive heart failure given IV Lasix and admitted to the floor.    Transferred to the intensive care unit secondary to acute hypercapnic respiratory failure requiring BiPAP   lines, Airways, Drains:    Anti-infectives:  Anti-infectives (From admission, onward)   Start     Dose/Rate Route Frequency Ordered Stop   06/06/18 2100  vancomycin (VANCOCIN) 1,250 mg in sodium chloride 0.9 % 250 mL IVPB     1,250 mg 166.7 mL/hr over 90 Minutes Intravenous Every 24 hours 06/06/18 1417     06/06/18 1400  vancomycin (VANCOCIN) 1,250 mg in sodium chloride 0.9 % 250 mL IVPB     1,250 mg 166.7 mL/hr over 90 Minutes Intravenous  Once 06/06/18 1334 06/07/18 0257      Microbiology: Results for orders placed or performed during the hospital encounter of 06/05/18  Culture, blood (routine x 2)     Status: None (Preliminary result)   Collection Time: 06/05/18  2:28 PM  Result Value Ref Range Status   Specimen Description BLOOD BLOOD LEFT HAND  Final   Special Requests   Final    BOTTLES DRAWN AEROBIC AND ANAEROBIC Blood Culture adequate volume   Culture   Final    NO GROWTH 2 DAYS Performed at [facility], [address], [place], [place] [address]    Report Status PENDING  Incomplete  Culture, blood (routine x 2)     Status: None (Preliminary result)   Collection Time: 06/05/18  2:28 PM  Result  Value Ref Range Status   Specimen Description   Final    BLOOD RIGHT ANTECUBITAL Performed at [facility], [address], [place], [place] [address]    Special Requests   Final    BOTTLES DRAWN AEROBIC AND ANAEROBIC Blood Culture adequate volume Performed at [facility], [address] [place], [place] [address]    Culture  Setup Time   Final    GRAM POSITIVE COCCI IN BOTH AEROBIC AND ANAEROBIC BOTTLES CRITICAL RESULT CALLED TO, READ BACK BY AND VERIFIED WITH: DAVID BESANTI AT 8756 ON 06/06/18 [place]. Performed at [facility], [place] [address], [place], [place] [address]    Culture GRAM POSITIVE COCCI  Final   Report Status PENDING  Incomplete  Blood Culture ID Panel (Reflexed)     Status: Abnormal   Collection Time: 06/05/18  2:28 PM  Result Value Ref Range Status   Enterococcus species NOT DETECTED NOT DETECTED Final   Listeria monocytogenes NOT DETECTED NOT DETECTED Final   Staphylococcus species DETECTED (A) NOT DETECTED Final    Comment: Methicillin (oxacillin) susceptible coagulase negative staphylococcus. Possible blood culture contaminant (unless isolated from more than one blood culture draw or clinical case suggests pathogenicity). No antibiotic treatment is indicated for blood  culture contaminants. CRITICAL RESULT CALLED TO, READ BACK BY AND VERIFIED WITH: DAVID BESANTI AT 5188 ON 06/06/18 [place].    Staphylococcus aureus (BCID) NOT DETECTED NOT DETECTED  Final   Methicillin resistance NOT DETECTED NOT DETECTED Final   Streptococcus species NOT DETECTED NOT DETECTED Final   Streptococcus agalactiae NOT DETECTED NOT DETECTED Final   Streptococcus pneumoniae NOT DETECTED NOT DETECTED Final   Streptococcus pyogenes NOT DETECTED NOT DETECTED Final   Acinetobacter baumannii NOT DETECTED NOT DETECTED Final   Enterobacteriaceae species NOT DETECTED NOT DETECTED Final   Enterobacter cloacae complex NOT DETECTED NOT DETECTED Final   Escherichia coli NOT  DETECTED NOT DETECTED Final   Klebsiella oxytoca NOT DETECTED NOT DETECTED Final   Klebsiella pneumoniae NOT DETECTED NOT DETECTED Final   Proteus species NOT DETECTED NOT DETECTED Final   Serratia marcescens NOT DETECTED NOT DETECTED Final   Haemophilus influenzae NOT DETECTED NOT DETECTED Final   Neisseria meningitidis NOT DETECTED NOT DETECTED Final   Pseudomonas aeruginosa NOT DETECTED NOT DETECTED Final   Candida albicans NOT DETECTED NOT DETECTED Final   Candida glabrata NOT DETECTED NOT DETECTED Final   Candida krusei NOT DETECTED NOT DETECTED Final   Candida parapsilosis NOT DETECTED NOT DETECTED Final   Candida tropicalis NOT DETECTED NOT DETECTED Final    Comment: Performed at [facility], [address] [place], [place] [address]  MRSA PCR Screening     Status: None   Collection Time: 06/06/18  1:05 PM  Result Value Ref Range Status   MRSA by PCR NEGATIVE NEGATIVE Final    Comment:        The GeneXpert MRSA Assay (FDA approved for NASAL specimens only), is one component of a comprehensive MRSA colonization surveillance program. It is not intended to diagnose MRSA infection nor to guide or monitor treatment for MRSA infections. Performed at [facility], [address], [place], [place] [address]    Studies: Dg Chest 2 View  Result Date: 06/05/2018 CLINICAL DATA:  Increase shortness-of-breath 1 week. Oxygen dependent. EXAM: CHEST - 2 VIEW COMPARISON:  05/10/2016 FINDINGS: Lungs are hypoinflated with mild stable elevation of the left hemidiaphragm. Mild bibasilar opacification is present which may be due to effusions with atelectasis versus infection. Stable cardiomegaly. Remainder of the exam is unchanged. IMPRESSION: Hypoinflation with bibasilar opacification likely effusions with atelectasis, although infection is possible within the lung bases. Stable cardiomegaly. Electronically Signed   By: Marin Olp M.D.   On: 06/05/2018 13:54   Ct Head Wo  Contrast  Result Date: 06/05/2018 CLINICAL DATA:  84 year old male with RIGHT arm weakness and recent falls. EXAM: CT HEAD WITHOUT CONTRAST TECHNIQUE: Contiguous axial images were obtained from the base of the skull through the vertex without intravenous contrast. COMPARISON:  01/29/2012 CT FINDINGS: Brain: No evidence of acute infarction, hemorrhage, hydrocephalus, extra-axial collection or mass lesion/mass effect. Atrophy and chronic small-vessel white matter ischemic changes again noted. Vascular: Intracranial atherosclerotic calcifications again noted. Skull: Normal. Negative for fracture or focal lesion. Sinuses/Orbits: No acute finding. Other: None. IMPRESSION: 1. No evidence of acute intracranial abnormality 2. Atrophy and chronic small-vessel white matter ischemic changes. Electronically Signed   By: Margarette Canada M.D.   On: 06/05/2018 14:19    Consults: Treatment Team:  Corey Skains, MD   Subjective:    Overnight Issues: No difficulty this morning, patient is on BiPAP, awake alert and in no acute distress  Objective:  Vital signs for last 24 hours: Temp:  [97.6 F (36.4 C)-98.4 F (36.9 C)] 98.4 F (36.9 C) (11/06 0800) Pulse Rate:  [49-66] 58 (11/06 0800) Resp:  [9-24] 18 (11/06 0800) BP: (113-169)/(52-90) 124/55 (11/06 0800) SpO2:  [  88 %-100 %] 88 % (11/06 0800) FiO2 (%):  [30 %] 30 % (11/06 0500) Weight:  [118.1 kg] 118.1 kg (11/06 0500)  Hemodynamic parameters for last 24 hours:    Intake/Output from previous day: 11/05 0701 - 11/06 0700 In: 267.4 [IV Piggyback:267.4] Out: 750 [Urine:750]  Intake/Output this shift: No intake/output data recorded.  Vent settings for last 24 hours: FiO2 (%):  [30 %] 30 %  Physical Exam:  Elderly appearing male, obese, somnolent but arousable  HEENT: Head atraumatic, normocephalic. Oropharynx and nasopharynx clear.  NECK: Supple, no jugular venous distention. No thyroid enlargement, no tenderness.   LUNGS:Mildwheezing,bibasilarrales CARDIOVASCULAR: S1, S2 normal. No murmurs, rubs, or gallops.  ABDOMEN: Soft, nontender, nondistended. Bowel sounds present. No organomegaly or mass.  EXTREMITIES:Bilateral leg edema 2+.No cyanosis, or clubbing.  NEUROLOGIC: Cranial nerves II through XII are intact. PSYCHIATRIC: The patient is alert and oriented x 3.  SKIN: No obvious rash, lesion, or ulcer  Assessment/Plan:    Acute on chronic hypercapnic respiratory failure.  Chest x-ray reveals cardiomegaly, elevated left hemidiaphragm, congestive heart failure.  Patient has been seen by cardiology patient will be moved to the intensive care unit, started on noninvasive ventilation.  On albuterol, Atrovent Solu-Medrol  Sepsis.  Patient with positive gram-positive cocci in blood with staph species.  Will place on vancomycin  Hyperglycemia/diabetes mellitus.  Placed on sliding scale  Renal insufficiency with BUN 29/creatinine 1.27  Anemia.  No evidence of active bleeding  Hermelinda Dellen, DO   06/07/2018  *Care during the described time interval was provided by me and/or other providers on the critical care team.  I have reviewed this patient's available data, including medical history, events of note, physical examination and test results as part of my evaluation.

## 2018-06-07 NOTE — Progress Notes (Signed)
Pharmacy Electrolyte and Glucose Monitoring Consult:  Pharmacy consulted to assist in monitoring and replacing electrolytes and glucose management in this 84 y.o. male admitted on 06/05/2018 with hypercapnic respiratory failure. Patient transferred to ICU on 11/5.   Labs:  Sodium (mmol/L)  Date Value  06/07/2018 144  11/20/2012 135 (L)   Potassium (mmol/L)  Date Value  06/07/2018 4.5  11/20/2012 3.7   Magnesium (mg/dL)  Date Value  06/07/2018 2.3   Calcium (mg/dL)  Date Value  06/07/2018 9.0   Calcium, Total (mg/dL)  Date Value  11/20/2012 8.9   Albumin (g/dL)  Date Value  06/05/2018 3.5  11/20/2012 3.5    Assessment/Plan: Glucose:  Patient initiated on SSI. Patient received Levemir 63 units at 2304 on 11/5 and Levemir 63 units at [place] on 11/5. Marland KitchenLevemir was discontinued and will restart today @ 1500. Patient is ordered methylprednisolone 40mg  IV Q6hr. Will continue to monitor.   Electrolytes:  Patient receiving furosemide 40mg  IV daily.  Electrolyte replacement not warranted at this time. Will order BMP with am labs.   Pharmacy will continue to monitor and adjust per consult.    Paticia Stack, PharmD Pharmacy Resident  06/07/2018 2:19 PM

## 2018-06-07 NOTE — Progress Notes (Signed)
[facility] [place] NAME: Daniel [name]    MR#:  [id number]  DATE OF BIRTH:  08-09-1927  SUBJECTIVE:   Much more awake and alert this afternoon, following commands.  Hypercapnia has improved.  Patient is being weaned off the BiPAP.  Family is at bedside.  REVIEW OF SYSTEMS:    Review of Systems  Constitutional: Negative for chills and fever.  HENT: Negative for congestion and tinnitus.   Eyes: Negative for blurred vision and double vision.  Respiratory: Negative for cough, shortness of breath and wheezing.   Cardiovascular: Negative for chest pain, orthopnea and PND.  Gastrointestinal: Negative for abdominal pain, diarrhea, nausea and vomiting.  Genitourinary: Negative for dysuria and hematuria.  Neurological: Negative for dizziness, sensory change and focal weakness.  All other systems reviewed and are negative.   Nutrition: Heart Healthy/Carb modified Tolerating Diet: Yes Tolerating PT:  Pt. Is bedbound at baseline.   DRUG ALLERGIES:   Allergies  Allergen Reactions  . Oxycodone Nausea And Vomiting  . Codeine     VITALS:  Blood pressure (!) 129/51, pulse 66, temperature 98.3 F (36.8 C), temperature source Axillary, resp. rate 18, height 5\' 9"  (1.753 m), weight 118.1 kg, SpO2 94 %.  PHYSICAL EXAMINATION:   Physical Exam  GENERAL:  84 y.o.-year-old obese patient lying in bed awake and following commands. EYES: Pupils equal, round, reactive to light. No scleral icterus.  HEENT: Head atraumatic, normocephalic. Oropharynx and nasopharynx clear.  NECK:  Supple, no jugular venous distention. No thyroid enlargement, no tenderness.  LUNGS: Normal breath sounds bilaterally, no wheezing, rales, rhonchi. No use of accessory muscles of respiration.  CARDIOVASCULAR: S1, S2 normal. No murmurs, rubs, or gallops.  ABDOMEN: Soft, nontender, nondistended. Bowel sounds present. No organomegaly or mass.  EXTREMITIES: No cyanosis,  clubbing, +1 edema b/l.    NEUROLOGIC: Cranial nerves II through XII are intact. No focal Motor or sensory deficits b/l. Globally weak but follows simple commands.  PSYCHIATRIC: The patient is alert and oriented x 1.  SKIN: No obvious rash, lesion, or ulcer.    LABORATORY PANEL:   CBC Recent Labs  Lab 06/07/18 0503  WBC 10.2  HGB 12.0*  HCT 38.6*  PLT 202   ------------------------------------------------------------------------------------------------------------------  Chemistries  Recent Labs  Lab 06/05/18 1428  06/07/18 0503  NA 140   < > 144  K 3.7   < > 4.5  CL 102   < > 104  CO2 29   < > 33*  GLUCOSE 171*   < > 150*  BUN 18   < > 29*  CREATININE 1.33*   < > 1.27*  CALCIUM 8.9   < > 9.0  MG  --    < > 2.3  AST 17  --   --   ALT 15  --   --   ALKPHOS 107  --   --   BILITOT 0.6  --   --    < > = values in this interval not displayed.   ------------------------------------------------------------------------------------------------------------------  Cardiac Enzymes Recent Labs  Lab 06/05/18 1428  TROPONINI <0.03   ------------------------------------------------------------------------------------------------------------------  RADIOLOGY:  No results found.   ASSESSMENT AND PLAN:   84 year old male with past medical history of hypertension, hyperlipidemia, diabetes, dementia, osteoarthritis who presented to the hospital due to shortness of breath.  1.  Acute respiratory failure with hypercapnia-secondary to possible underlying sleep apnea with mild CHF.   Much improved with BiPAP and patient  is more awake and alert today.  BiPAP weaned off.  2.  CHF-acute on chronic diastolic dysfunction. -Clinically improved, continue diuresis with IV Lasix, about 1.7 L negative since admission.  Patient is improving.   -Continue Toprol.  3.  COPD- mild acute exacerbation given the hypercapnic respiratory failure. -Weaned off BiPAP and much more awake today.   Continue IV steroids but will taper them, continue scheduled duo nebs, Pulmicort nebs.  4.  Diabetes type 2 without complication-continue sliding scale insulin, follow blood sugars are currently stable.  5.  Essential hypertension-continue Toprol, Norvasc.  6.  Dementia-continue Namenda.  7.  Depression-continue Lexapro.  8.  Positive blood cultures -patient blood cultures are positive for coagulation negative staph which is likely a skin contaminant. - off abx now.   Transfer to floor and will likely d/c to SNF   All the records are reviewed and case discussed with Care Management/Social Worker. Management plans discussed with the patient, family and they are in agreement.  CODE STATUS: Full code  DVT Prophylaxis: Hep. SQ  TOTAL Critical Care TIME TAKING CARE OF THIS PATIENT: 30 minutes.   POSSIBLE D/C IN 2-3 DAYS, DEPENDING ON CLINICAL CONDITION.   Henreitta Leber M.D on 06/07/2018 at 4:21 PM  Between 7am to 6pm - Pager - [phone]  After 6pm go to [email or web address] - Proofreader  [facility]  Office  [phone]  CC: Primary care physician; [place]endale Chard, MD

## 2018-06-07 NOTE — Progress Notes (Signed)
Pt was taken off of bipap and placed on 3L n/c to see if he would tolerate.  Pt was able to maintain sats for one hour but then sats went into the 80s and he was placed back on bipap Pt is now resting comfortably on bipap at 30%.

## 2018-06-07 NOTE — Progress Notes (Signed)
Pt being transferred to room 256. Report called to [place], Therapist, sports. Pt and belongings transferred to room 256 without incident.

## 2018-06-07 NOTE — Consult Note (Signed)
Pharmacy Antibiotic Note  Samad Thon is a 84 y.o. male admitted on 06/05/2018 with bacteremia. Presented with leg swelling, SOB, wheezing, cough. Patient has CKD. Pharmacy has been consulted for Vancomycin dosing.  Plan: Discontinued Vancomycin per CCM discussion. Likely contaminant, discontinued due to lack of clinical markers - afebrile, normal procalcitonin.  Height: 5\' 9"  (175.3 cm) Weight: 260 lb 5.8 oz (118.1 kg) IBW/kg (Calculated) : 70.7  Temp (24hrs), Avg:98.1 F (36.7 C), Min:97.6 F (36.4 C), Max:98.4 F (36.9 C)  Recent Labs  Lab 06/05/18 1428 06/05/18 1608 06/06/18 0422 06/07/18 0503  WBC 6.6  --  6.7 10.2  CREATININE 1.33*  --  1.47* 1.27*  LATICACIDVEN 0.9 1.0  --   --     Estimated Creatinine Clearance: 51 mL/min (A) (by C-G formula based on SCr of 1.27 mg/dL (H)).    Allergies  Allergen Reactions  . Oxycodone Nausea And Vomiting  . Codeine     Antimicrobials this admission: 11/5 Vancomycin >> 11/6  Dose adjustments this admission: N/A  Microbiology results: 11/4 BCx: 2/4 GPCs (both aerobic and anaerobic bottles) - likely contaminant 11/5 MRSA PCR: (-)  Thank you for allowing pharmacy to be a part of this patient's care.  Paticia Stack, PharmD Pharmacy Resident  06/07/2018 12:36 PM

## 2018-06-08 DIAGNOSIS — R0902 Hypoxemia: Secondary | ICD-10-CM

## 2018-06-08 DIAGNOSIS — Z7189 Other specified counseling: Secondary | ICD-10-CM

## 2018-06-08 DIAGNOSIS — J9601 Acute respiratory failure with hypoxia: Secondary | ICD-10-CM

## 2018-06-08 DIAGNOSIS — Z794 Long term (current) use of insulin: Secondary | ICD-10-CM

## 2018-06-08 DIAGNOSIS — Z515 Encounter for palliative care: Secondary | ICD-10-CM

## 2018-06-08 DIAGNOSIS — N183 Chronic kidney disease, stage 3 (moderate): Secondary | ICD-10-CM

## 2018-06-08 DIAGNOSIS — E1122 Type 2 diabetes mellitus with diabetic chronic kidney disease: Secondary | ICD-10-CM

## 2018-06-08 DIAGNOSIS — I509 Heart failure, unspecified: Secondary | ICD-10-CM

## 2018-06-08 DIAGNOSIS — G4733 Obstructive sleep apnea (adult) (pediatric): Secondary | ICD-10-CM

## 2018-06-08 LAB — CBC
HCT: 36.5 % — ABNORMAL LOW (ref 39.0–52.0)
Hemoglobin: 11.8 g/dL — ABNORMAL LOW (ref 13.0–17.0)
MCH: 25.1 pg — AB (ref 26.0–34.0)
MCHC: 32.3 g/dL (ref 30.0–36.0)
MCV: 77.7 fL — AB (ref 80.0–100.0)
Platelets: 209 10*3/uL (ref 150–400)
RBC: 4.7 MIL/uL (ref 4.22–5.81)
RDW: 15.3 % (ref 11.5–15.5)
WBC: 9.1 10*3/uL (ref 4.0–10.5)
nRBC: 0 % (ref 0.0–0.2)

## 2018-06-08 LAB — BASIC METABOLIC PANEL
Anion gap: 7 (ref 5–15)
BUN: 42 mg/dL — AB (ref 8–23)
CO2: 33 mmol/L — AB (ref 22–32)
CREATININE: 1.48 mg/dL — AB (ref 0.61–1.24)
Calcium: 8.9 mg/dL (ref 8.9–10.3)
Chloride: 101 mmol/L (ref 98–111)
GFR calc Af Amer: 47 mL/min — ABNORMAL LOW (ref >60)
GFR calc non Af Amer: 40 mL/min — ABNORMAL LOW (ref >60)
GLUCOSE: 125 mg/dL — AB (ref 70–99)
Potassium: 4.8 mmol/L (ref 3.5–5.1)
Sodium: 141 mmol/L (ref 135–145)

## 2018-06-08 LAB — CULTURE, BLOOD (ROUTINE X 2): Special Requests: ADEQUATE

## 2018-06-08 LAB — GLUCOSE, CAPILLARY
GLUCOSE-CAPILLARY: 222 mg/dL — AB (ref 70–99)
GLUCOSE-CAPILLARY: 334 mg/dL — AB (ref 70–99)
Glucose-Capillary: 109 mg/dL — ABNORMAL HIGH (ref 70–99)
Glucose-Capillary: 187 mg/dL — ABNORMAL HIGH (ref 70–99)

## 2018-06-08 MED ORDER — PREDNISONE 10 MG PO TABS: ORAL_TABLET | ORAL | Status: DC

## 2018-06-08 MED ORDER — IPRATROPIUM-ALBUTEROL 0.5-2.5 (3) MG/3ML IN SOLN
3.0000 mL | Freq: Three times a day (TID) | RESPIRATORY_TRACT | Status: DC
  Administered 2018-06-08 – 2018-06-09 (×5): 3 mL via RESPIRATORY_TRACT
  Filled 2018-06-08 (×5): qty 3

## 2018-06-08 MED ORDER — INSULIN DETEMIR 100 UNIT/ML ~~LOC~~ SOLN
63.0000 [IU] | Freq: Every day | SUBCUTANEOUS | Status: DC
  Administered 2018-06-08: 63 [IU] via SUBCUTANEOUS
  Filled 2018-06-08 (×2): qty 0.63

## 2018-06-08 MED ORDER — IPRATROPIUM-ALBUTEROL 0.5-2.5 (3) MG/3ML IN SOLN: 3.0000 mL | Freq: Four times a day (QID) | RESPIRATORY_TRACT | Status: AC | PRN

## 2018-06-08 NOTE — Care Management Important Message (Signed)
Copy of signed IM left with patient in room.  

## 2018-06-08 NOTE — Care Management (Signed)
Order placed for care management consult.  Patient is from [facility] but does not plan to go back.  Randall Hiss, CSW is following and working on other facilities for SNF.  No further needs at this time identified by [facility].

## 2018-06-08 NOTE — Progress Notes (Signed)
Per CSW, patient unable to leave hospital today d/t availability of CPAP at facility.  Dr. Verdell Carmine gave nursing permission to cancel discharge order in computer.

## 2018-06-08 NOTE — NC FL2 (Signed)
[place] LEVEL OF CARE SCREENING TOOL     IDENTIFICATION  Patient Name: Daniel [name] Birthdate: 1927/09/17 Sex: male Admission Date (Current Location): 06/05/2018  [place] and [place] Number:  Daniel [name] [id number] [place] and Address:  [facility], [address], [place], [place] [address]      Provider Number: [id number]  Attending Physician Name and Address:  Henreitta Leber, MD  Relative Name and Phone Number:  Daniel, [name] Daughter [phone]  [phone] and [name],Daniel Daughter   [phone]     Current Level of Care: Hospital Recommended Level of Care: [place] Prior Approval Number:    Date Approved/Denied:   PASRR Number: [id number]  Discharge Plan: SNF    Current Diagnoses: Patient Active Problem List   Diagnosis Date Noted  . Acute respiratory failure with hypoxia ([place]) 06/05/2018  . Right groin pain 12/06/2017  . Tibia/fibula fracture 09/26/2015    Orientation RESPIRATION BLADDER Height & Weight     Self, Situation, Place  O2(3.5 L) Incontinent Weight: 267 lb (121.1 kg) Height:  5\' 9"  (175.3 cm)  BEHAVIORAL SYMPTOMS/MOOD NEUROLOGICAL BOWEL NUTRITION STATUS      Continent Diet(Cardiac diet)  AMBULATORY STATUS COMMUNICATION OF NEEDS Skin   Extensive Assist Verbally Normal                       Personal Care Assistance Level of Assistance  Bathing, Feeding, Dressing Bathing Assistance: Limited assistance Feeding assistance: Limited assistance Dressing Assistance: Limited assistance     Functional Limitations Info  Hearing, Sight, Speech Sight Info: Adequate Hearing Info: Adequate Speech Info: Adequate    SPECIAL CARE FACTORS FREQUENCY                       Contractures Contractures Info: Not present    Additional Factors Info  Code Status, Allergies, Psychotropic, Insulin Sliding Scale Code Status Info: Full Code Allergies Info: OXYCODONE, CODEINE   Psychotropic Info: escitalopram (LEXAPRO) tablet 5 mg  Insulin Sliding Scale Info: insulin aspart (novoLOG) injection 0-9 Units 3x a day with meals       Current Medications (06/08/2018):  This is the current hospital active medication list Current Facility-Administered Medications  Medication Dose Route Frequency Provider Last Rate Last Dose  . 0.9 %  sodium chloride infusion  250 mL Intravenous PRN Demetrios Loll, MD      . acetaminophen (TYLENOL) tablet 650 mg  650 mg Oral Q6H PRN Demetrios Loll, MD       Or  . acetaminophen (TYLENOL) suppository 650 mg  650 mg Rectal Q6H PRN Demetrios Loll, MD      . albuterol (PROVENTIL) (2.5 MG/3ML) 0.083% nebulizer solution 2.5 mg  2.5 mg Nebulization Q2H PRN Demetrios Loll, MD   2.5 mg at 06/05/18 1832  . amLODipine (NORVASC) tablet 10 mg  10 mg Oral Daily Demetrios Loll, MD   10 mg at 06/08/18 0935  . atorvastatin (LIPITOR) tablet 10 mg  10 mg Oral Daily Demetrios Loll, MD   10 mg at 06/08/18 0935  . bisacodyl (DULCOLAX) EC tablet 5 mg  5 mg Oral Daily PRN Demetrios Loll, MD      . escitalopram [place]) tablet 5 mg  5 mg Oral Daily Demetrios Loll, MD   5 mg at 06/08/18 0937  . fluticasone (FLONASE) 50 MCG/ACT nasal spray 2 spray  2 spray Each Nare QHS Demetrios Loll, MD   2 spray at 06/08/18 0936  . furosemide (LASIX) injection 40  mg  40 mg Intravenous Daily Henreitta Leber, MD   40 mg at 06/08/18 0935  . guaiFENesin-dextromethorphan (ROBITUSSIN DM) 100-10 MG/5ML syrup 5 mL  5 mL Oral Q4H PRN Demetrios Loll, MD   5 mL at 06/07/18 2224  . heparin injection 5,000 Units  5,000 Units Subcutaneous Q8H Demetrios Loll, MD   5,000 Units at 06/08/18 [phone]  . Influenza vac split quadrivalent PF (FLUZONE HIGH-DOSE) injection 0.5 mL  0.5 mL Intramuscular Tomorrow-1000 Vira Agar, Juan Quam, RN      . insulin aspart (novoLOG) injection 0-5 Units  0-5 Units Subcutaneous QHS Demetrios Loll, MD   3 Units at 06/07/18 2222  . insulin aspart (novoLOG) injection 0-9 Units  0-9 Units Subcutaneous TID WC Demetrios Loll, MD    2 Units at 06/07/18 1658  . insulin detemir (LEVEMIR) injection 63 Units  63 Units Subcutaneous Daily Paticia Stack, [place]   63 Units at 06/07/18 1657  . ipratropium (ATROVENT) 0.06 % nasal spray 2 spray  2 spray Each Nare TID Demetrios Loll, MD   2 spray at 06/07/18 [phone]  . ipratropium-albuterol (DUONEB) 0.5-2.5 (3) MG/3ML nebulizer solution 3 mL  3 mL Nebulization TID Henreitta Leber, MD   3 mL at 06/08/18 0740  . MEDLINE mouth rinse  15 mL Mouth Rinse BID Henreitta Leber, MD   15 mL at 06/07/18 0918  . memantine (NAMENDA XR) 24 hr capsule 28 mg  28 mg Oral Daily Demetrios Loll, MD   28 mg at 06/08/18 0937  . methylPREDNISolone sodium succinate (SOLU-MEDROL) 40 mg/mL injection 40 mg  40 mg Intravenous Q12H Henreitta Leber, MD   40 mg at 06/08/18 0022  . metoprolol succinate (TOPROL-XL) 24 hr tablet 37.5 mg  37.5 mg Oral BID Demetrios Loll, MD   37.5 mg at 06/08/18 0935  . ondansetron (ZOFRAN) tablet 4 mg  4 mg Oral Q6H PRN Demetrios Loll, MD       Or  . ondansetron [facility]) injection 4 mg  4 mg Intravenous Q6H PRN Demetrios Loll, MD      . pneumococcal 23 valent vaccine (PNU-IMMUNE) injection 0.5 mL  0.5 mL Intramuscular Tomorrow-1000 Elliott, Sabrina A, RN      . polyethylene glycol (MIRALAX / GLYCOLAX) packet 17 g  17 g Oral Daily Demetrios Loll, MD   17 g at 06/08/18 1448  . senna-docusate (Senokot-S) tablet 1 tablet  1 tablet Oral QHS PRN Demetrios Loll, MD      . senna-docusate (Senokot-S) tablet 2 tablet  2 tablet Oral BID Demetrios Loll, MD   2 tablet at 06/08/18 0935  . sodium chloride flush (NS) 0.9 % injection 3 mL  3 mL Intravenous Q12H Demetrios Loll, MD   3 mL at 06/08/18 1127  . sodium chloride flush (NS) 0.9 % injection 3 mL  3 mL Intravenous PRN Demetrios Loll, MD      . tiZANidine (ZANAFLEX) tablet 2 mg  2 mg Oral BID Demetrios Loll, MD   2 mg at 06/08/18 1856     Discharge Medications: Please see discharge summary for a list of discharge medications.  Relevant Imaging Results:  Relevant Lab  Results:   Additional Information SSN [id number]  Daniel [name], [place]

## 2018-06-08 NOTE — Clinical Social Work Note (Signed)
CSW spoke with patient's daughter Maudie Mercury [phone], she would like CSW to see what other facilities are able to accept patient.  She would like CSW to look in [place] and [place] to see what's available.  CSW informed her that because he is under long term care Medicaid, the options will be pretty limited.  Patient's daughter expressed understanding, CSW informed her that if CSW is unable to find a different facility, he may have to return to [facility] and they can assist with helping find a different placement.  CSW to begin bed search in [place] and [place].  Jones Broom. [place], MSW, [place]  06/08/2018 11:49 AM

## 2018-06-08 NOTE — Clinical Social Work Note (Addendum)
CSW spoke with patient's family and informed them that patient does not have any other bed offers for long term Medicaid beds.  Patient's family is agreeable to returning back, CSW informed [facility] that family had some concerns with patient's care.  CSW told the patient's family to talk with [facility] about the concerns they have, and if things aren't resolved, they can call the [facility], [place] provided contact information for county Ombudsman and the state Ombudsman.  [facility]W informed [facility] that patient is now on a CPAP, [facility] said they are not able to get CPAP in today but they will have it tomorrow.  CSW updated physician, bedside nurse, and family, patient's family was appreciative of help CSW provided.  Patient should be able to discharge tomorrow to [facility] [place] if he is medically ready for discharge and orders have been received.  CSW was asked to have Palliative follow patient at [facility], CSW made referral to Santiago Glad at [place] and [facility] nurse liaison.  Jones Broom. [place], MSW, [place]  06/08/2018 4:22 PM

## 2018-06-08 NOTE — Progress Notes (Signed)
New referral for outpatient Palliative to follow at [facility] received from [place]. Plan is for discharge tomorrow. Patient information faxed to referral. Flo Shanks RN, BSN, [facility] and [facility] [place], hospital Liaison [phone]

## 2018-06-08 NOTE — Progress Notes (Signed)
[facility] En[facility]  Patient: Daniel [name] / Admit Date: 06/05/2018 / Date of Encounter: 06/08/2018, 12:58 PM   Subjective: Patient has recovered from hypercapnia and respiratory failure now without oxygen or further ICU treatment.  Patient has continued lower extremity edema most consistent with dependent edema pulmonary hypertension without evidence of significant congestive heart failure.  Patient lungs appear to be clear today consistent with improvements of symptoms and issues from admission.  No current evidence of myocardial infarction with normal troponin Review of Systems: Cannot assess due to somnolence and  Objective: Telemetry: Normal sinus rhythm Physical Exam: Blood pressure 136/68, pulse (!) 58, temperature 99.1 F (37.3 C), temperature source Oral, resp. rate 17, height 5\' 9"  (1.753 m), weight 121.1 kg, SpO2 99 %. Body mass index is 39.43 kg/m. General: Well developed, well nourished, in some acute distress. Head: Normocephalic, atraumatic, sclera non-icteric, no xanthomas, nares are without discharge. Neck: No apparent masses Lungs: Normal respirations with diffuse wheezes, no rhonchi, no rales , few basilar crackles   Heart: Regular rate and rhythm, normal S1 S2, no murmur, no rub, no gallop, PMI is normal size and placement, carotid upstroke normal without bruit, jugular venous pressure normal Abdomen: Soft, non-tender, non-distended with normoactive bowel sounds. No hepatosplenomegaly. Abdominal aorta is normal size without bruit Extremities: 2+ edema, no clubbing, no cyanosis, positive ulcers,  Peripheral: 2+ radial, 0 + femoral, 0 + dorsal pedal pulses Neuro: Not alert and oriented. Moves all extremities spontaneously. Psych: Does not responds to questions appropriately with a normal affect.   Intake/Output Summary (Last 24 hours) at 06/08/2018 1258 Last data filed at 06/08/2018 0558 Gross per 24 hour  Intake 243 ml  Output 700 ml  Net  -457 ml    Inpatient Medications:  . amLODipine  10 mg Oral Daily  . atorvastatin  10 mg Oral Daily  . escitalopram  5 mg Oral Daily  . fluticasone  2 spray Each Nare QHS  . furosemide  40 mg Intravenous Daily  . heparin  5,000 Units Subcutaneous Q8H  . Influenza vac split quadrivalent PF  0.5 mL Intramuscular Tomorrow-1000  . insulin aspart  0-5 Units Subcutaneous QHS  . insulin aspart  0-9 Units Subcutaneous TID WC  . insulin detemir  63 Units Subcutaneous Daily  . ipratropium  2 spray Each Nare TID  . ipratropium-albuterol  3 mL Nebulization TID  . mouth rinse  15 mL Mouth Rinse BID  . memantine  28 mg Oral Daily  . methylPREDNISolone (SOLU-MEDROL) injection  40 mg Intravenous Q12H  . metoprolol succinate  37.5 mg Oral BID  . pneumococcal 23 valent vaccine  0.5 mL Intramuscular Tomorrow-1000  . polyethylene glycol  17 g Oral Daily  . senna-docusate  2 tablet Oral BID  . sodium chloride flush  3 mL Intravenous Q12H  . tiZANidine  2 mg Oral BID   Infusions:  . sodium chloride      Labs: Recent Labs    06/06/18 0422 06/07/18 0503 06/08/18 0601  NA 142 144 141  K 4.7 4.5 4.8  CL 104 104 101  CO2 32 33* 33*  GLUCOSE 245* 150* 125*  BUN 24* 29* 42*  CREATININE 1.47* 1.27* 1.48*  CALCIUM 8.9 9.0 8.9  MG 2.0 2.3  --    Recent Labs    06/05/18 1428  AST 17  ALT 15  ALKPHOS 107  BILITOT 0.6  PROT 8.4*  ALBUMIN 3.5   Recent Labs    06/05/18 1428  06/07/18 0503 06/08/18 0601  WBC 6.6   < > 10.2 9.1  NEUTROABS 3.2  --   --   --   HGB 12.0*   < > 12.0* 11.8*  HCT 38.7*   < > 38.6* 36.5*  MCV 78.8*   < > 78.0* 77.7*  PLT 186   < > 202 209   < > = values in this interval not displayed.   Recent Labs    06/05/18 1428  TROPONINI <0.03   Invalid input(s): POCBNP No results for input(s): HGBA1C in the last 72 hours.   Weights: Filed Weights   06/06/18 0448 06/07/18 0500 06/08/18 0517  Weight: 122.4 kg 118.1 kg 121.1 kg     Radiology/Studies:  Dg  Chest 2 View  Result Date: 06/05/2018 CLINICAL DATA:  Increase shortness-of-breath 1 week. Oxygen dependent. EXAM: CHEST - 2 VIEW COMPARISON:  05/10/2016 FINDINGS: Lungs are hypoinflated with mild stable elevation of the left hemidiaphragm. Mild bibasilar opacification is present which may be due to effusions with atelectasis versus infection. Stable cardiomegaly. Remainder of the exam is unchanged. IMPRESSION: Hypoinflation with bibasilar opacification likely effusions with atelectasis, although infection is possible within the lung bases. Stable cardiomegaly. Electronically Signed   By: Marin Olp M.D.   On: 06/05/2018 13:54   Ct Head Wo Contrast  Result Date: 06/05/2018 CLINICAL DATA:  84 year old male with RIGHT arm weakness and recent falls. EXAM: CT HEAD WITHOUT CONTRAST TECHNIQUE: Contiguous axial images were obtained from the base of the skull through the vertex without intravenous contrast. COMPARISON:  01/29/2012 CT FINDINGS: Brain: No evidence of acute infarction, hemorrhage, hydrocephalus, extra-axial collection or mass lesion/mass effect. Atrophy and chronic small-vessel white matter ischemic changes again noted. Vascular: Intracranial atherosclerotic calcifications again noted. Skull: Normal. Negative for fracture or focal lesion. Sinuses/Orbits: No acute finding. Other: None. IMPRESSION: 1. No evidence of acute intracranial abnormality 2. Atrophy and chronic small-vessel white matter ischemic changes. Electronically Signed   By: Margarette Canada M.D.   On: 06/05/2018 14:19     Assessment and Recommendation  84 y.o. male with significant dementia previous apparent myocardial infarction diabetes hypertension hyperlipidemia with severe lower extremity edema multifactorial in nature including medications pulmonary hypertension and dependent edema and hypercapnia requiring further oxygenation now significantly improved 1.  Continue to watch for respiratory failure and hypercapnia  2.  Use of  amlodipine due to worsening lower extremity edema and dependent edema with possible ulcers 3.  Some treatment with diuresis for lower extremity edema as able now causing significant chronic kidney disease 4.  No further cardiac diagnostics necessary at this time  5.  Okay for [facility] back to [facility] with medication management as per above the cardiac standpoint  Signed, Serafina Royals M.D. FACC

## 2018-06-08 NOTE — Consult Note (Signed)
Consultation Note Date: 06/08/2018   Patient Name: Daniel [name]  DOB: Oct 21, 1927  MRN: [id number]  Age / Sex: 84 y.o., male  PCP: Glendale Chard, MD Referring Physician: Henreitta Leber, MD  Reason for Consultation: Establishing goals of care  HPI/Patient Profile: 84 y.o. male admitted on 06/05/2018 from [facility] with complaints of shortness of breath. He has a past medical history of dementia, diabetes, CKD, hyperlipidemia, arthritis, muscle weakness, diastolic CHF, and back pain. During his ED course family and reports from facility identify patient has experienced bilateral leg edema for 3 weeks prior to admission. He complained of cough and wheezing. Patient denied fever, chills, pain, or palpitations. He was noted to have oxygen saturations in the 80s and placed on oxygen via nasal cannula for support. Patient is wheelchair bound at baseline. Chest x-ray showed hyperventilation and atelectasis changes. NA 140, Potassium 3.7, Glucose 171, BUN 18, Cr 1.33, BNP 43.0, Lactic acid 0.9, WBC 6.6, hgb 12.0. Since admission required BiPAP for respiratory support, but since has been weaned back to nasal cannula with steroid support. He has been seen by Cardiology and is being diuresed with lasix. Patient requiring CPAP at bedtime for sleep apnea. Palliative Medicine team was consulted for goals of care discussion.   Clinical Assessment and Goals of Care: I have reviewed medical records including lab results, imaging, Epic notes, and MAR, received report from the bedside RN, and assessed the patient. I discussed with his daughter/POA Daniel [name] patient's current diagnosis prognosis, GOC, EOL wishes, disposition and options.  I introduced Palliative Medicine as specialized medical care for people living with serious illness. It focuses on providing relief from the symptoms and stress of a serious illness. The goal is to  improve quality of life for both the patient and the family.  Family reports prior to admission patient was a resident at [facility]. Prior to his placement there patient was deconditioned and having difficulty in the home due to his dementia. Daughter reports patient has been wheelchair bound for some time. He has a good appetite and can feed himself. Family reports patient requires assistance with ADLs.   We discussed his current illness and what it means in the larger context of his on-going co-morbidities.  Natural disease trajectory and expectations at EOL were discussed. Daughter verbalized understanding. She expresses she and her family remain hopeful that patient will continue to thrive and live many more years despite known health conditions.   I attempted to elicit values and goals of care important to the patient.    The difference between aggressive medical intervention and comfort care was considered in light of the patient's goals of care. At this time daughter and family express their goals are to continue to treat the treatable with aggressive interventions. Patient is scheduled to be discharged today or tomorrow back to [facility]. Daughter verbalizes concerns with the facility but is aware at this time there are no other options or bed availability/offers for her father. She expresses concerns with the facility and their level of  care. Daughter and family has been educated previously by CSW their options in discussing concerns with the facility personnel and also other resources. Daughter verbalized understanding.   Daughter, Daniel [name] is patients HCPOA. We discussed in details patient's current full code status. I discussed in details what a potential code could look like in consideration to patient's current illness and co-morbidities. Daughter verbalized understanding and expressed her father's wishes are to remain a full code with aggressive interventions.   Hospice and Palliative Care  services outpatient were explained and offered. Daughter and family verbalized understanding and appreciation. At this time daughter express they would like to have outpatient palliative services at discharge. Family is aware at anytime they may transition care to hospice and discuss with outpatient palliative team for support.   Questions and concerns were addressed.The family was encouraged to call with questions or concerns.  PMT will continue to support holistically.  Primary Decision Maker:  HCPOA/DAUGHTER: Daniel [name]    SUMMARY OF RECOMMENDATIONS    Full Code-as confirmed by family/patient  Continue to treat the treatable with aggressive measures.   Daughter expresses awareness of patient returning back to [facility] at this time. She expressed hopefulness of patient being placed at another facility, however is aware of limited options and availability. She and family is also hopeful patient will continue to show signs of improvement.   Family requesting outpatient Palliative support.   CSW referral for outpatient Palliative at facility.   Palliative Medicine team will continue to support and follow as needed.   Code Status/Advance Care Planning:  Full code  Palliative Prophylaxis:   Aspiration, Bowel Regimen, Delirium Protocol, Frequent Pain Assessment and Turn Reposition  Additional Recommendations (Limitations, Scope, Preferences):  Full Scope Treatment-continue to treat the treatable with aggressive measures.   Psycho-social/Spiritual:   Desire for further Chaplaincy support NO  Prognosis:   Guarded to Poor   Discharge Planning: [facility] with Outpatient Palliative       Primary Diagnoses: Present on Admission: . Acute respiratory failure with hypoxia ([place])   I have reviewed the medical record, interviewed the patient and family, and examined the patient. The following aspects are pertinent.  Past Medical History:  Diagnosis Date  . Arthritis     . Chronic kidney disease   . Dementia ([place])   . Diabetes mellitus without complication (HCC)    Type 2  . Hyperlipidemia   . Hypertension   . Low back pain   . Muscle weakness (generalized)   . Other fracture of t5-T6 vertebra, subsequent encounter for fracture with routine healing   . Polyosteoarthritis   . Renal disorder   . Type 2 diabetes mellitus ([place])   . Unspecified fracture of t5-T6 vertebra, subsequent encounter for fracture with routine healing    Social History   Socioeconomic History  . Marital status: Widowed    Spouse name: Not on file  . Number of children: Not on file  . Years of education: Not on file  . Highest education level: Not on file  Occupational History  . Not on file  Social Needs  . Financial resource strain: Not on file  . Food insecurity:    Worry: Not on file    Inability: Not on file  . Transportation needs:    Medical: Not on file    Non-medical: Not on file  Tobacco Use  . Smoking status: Never Smoker  . Smokeless tobacco: Never Used  Substance and Sexual Activity  . Alcohol use: No  .  Drug use: Not on file  . Sexual activity: Not on file  Lifestyle  . Physical activity:    Days per week: Not on file    Minutes per session: Not on file  . Stress: Not on file  Relationships  . Social connections:    Talks on phone: Not on file    Gets together: Not on file    Attends religious service: Not on file    Active member of club or organization: Not on file    Attends meetings of clubs or organizations: Not on file    Relationship status: Not on file  Other Topics Concern  . Not on file  Social History Narrative  . Not on file   Family History  Problem Relation Age of Onset  . Hypertension Father    Scheduled Meds: . amLODipine  10 mg Oral Daily  . atorvastatin  10 mg Oral Daily  . escitalopram  5 mg Oral Daily  . fluticasone  2 spray Each Nare QHS  . furosemide  40 mg Intravenous Daily  . heparin  5,000 Units Subcutaneous  Q8H  . Influenza vac split quadrivalent PF  0.5 mL Intramuscular Tomorrow-1000  . insulin aspart  0-5 Units Subcutaneous QHS  . insulin aspart  0-9 Units Subcutaneous TID WC  . insulin detemir  63 Units Subcutaneous QHS  . ipratropium  2 spray Each Nare TID  . ipratropium-albuterol  3 mL Nebulization TID  . mouth rinse  15 mL Mouth Rinse BID  . memantine  28 mg Oral Daily  . methylPREDNISolone (SOLU-MEDROL) injection  40 mg Intravenous Q12H  . metoprolol succinate  37.5 mg Oral BID  . pneumococcal 23 valent vaccine  0.5 mL Intramuscular Tomorrow-1000  . polyethylene glycol  17 g Oral Daily  . senna-docusate  2 tablet Oral BID  . sodium chloride flush  3 mL Intravenous Q12H  . tiZANidine  2 mg Oral BID   Continuous Infusions: . sodium chloride     PRN Meds:.sodium chloride, acetaminophen **OR** acetaminophen, albuterol, bisacodyl, guaiFENesin-dextromethorphan, ondansetron **OR** ondansetron (ZOFRAN) IV, senna-docusate, sodium chloride flush Medications Prior to Admission:  Prior to Admission medications   Medication Sig Start Date End Date Taking? Authorizing Provider  amLODipine (NORVASC) 10 MG tablet Take 10 mg by mouth daily.   Yes [provider]  atorvastatin (LIPITOR) 10 MG tablet Take 10 mg by mouth daily.    Yes [provider]  bumetanide (BUMEX) 0.5 MG tablet Take 0.5 mg by mouth daily.   Yes [provider]  escitalopram (LEXAPRO) 5 MG tablet Take 5 mg by mouth daily.   Yes [provider]  fluticasone (FLONASE) 50 MCG/ACT nasal spray Place 2 sprays into both nostrils daily. Patient taking differently: Place 2 sprays into both nostrils at bedtime.  09/30/17  Yes Yu, Amy V, PA-C  glimepiride (AMARYL) 4 MG tablet Take 4 mg by mouth daily.   Yes [provider]  insulin aspart (NOVOLOG) 100 UNIT/ML injection Inject 3-11 Units into the skin 4 (four) times daily -  before meals and at bedtime. Per sliding scale: 3 units = if blood sugar  is 201-250 5 units = if blood sugar is 251-300 7 units = if blood sugar is 301-350 9 units = if blood sugar is 351-400 11 units = if blood sugar is greater than 400   Yes [provider]  insulin detemir (LEVEMIR) 100 UNIT/ML injection Inject 63 Units into the skin daily.   Yes [provider]  ipratropium (ATROVENT) 0.06 % nasal spray Place 2 sprays into both nostrils 4 (four) times daily. Patient taking differently: Place 2 sprays into both nostrils 3 (three) times daily.  09/30/17  Yes Yu, Amy V, PA-C  linagliptin (TRADJENTA) 5 MG TABS tablet Take 5 mg by mouth daily.   Yes [provider]  losartan (COZAAR) 100 MG tablet Take 100 mg by mouth daily.   Yes [provider]  memantine (NAMENDA XR) 28 MG CP24 24 hr capsule Take 28 mg by mouth daily.   Yes [provider]  metoprolol succinate (TOPROL-XL) 25 MG 24 hr tablet Take 37.5 mg by mouth 2 (two) times daily.   Yes [provider]  polyethylene glycol (MIRALAX / GLYCOLAX) packet Take 17 g by mouth daily.   Yes [provider]  senna-docusate (SENOKOT-S) 8.6-50 MG tablet Take 2 tablets by mouth 2 (two) times daily.   Yes [provider]  tizanidine (ZANAFLEX) 2 MG capsule Take 2 mg by mouth 2 (two) times daily.   Yes [provider]  ipratropium-albuterol (DUONEB) 0.5-2.5 (3) MG/3ML SOLN Take 3 mLs by nebulization every 6 (six) hours as needed. 06/08/18   Henreitta Leber, MD  predniSONE (DELTASONE) 10 MG tablet Label  & dispense according to the schedule below. 5 Pills PO for 1 day then, 4 Pills PO for 1 day, 3 Pills PO for 1 day, 2 Pills PO for 1 day, 1 Pill PO for 1 days then STOP. 06/08/18   Henreitta Leber, MD   Allergies  Allergen Reactions  . Oxycodone Nausea And Vomiting  . Codeine    Review of Systems  Unable to perform ROS: Acuity of condition    Physical Exam  Constitutional: He is oriented to person, place, and time. Vital signs are normal. He is  cooperative. He appears ill.  Chronically ill appearing   Cardiovascular: Normal rate, regular rhythm, normal heart sounds and normal pulses.  Pulmonary/Chest: Effort normal. He has decreased breath sounds.  Abdominal: Soft. Normal appearance and bowel sounds are normal.  Neurological: He is alert and oriented to person, place, and time.  Skin: Skin is warm, dry and intact. Bruising noted.  Psychiatric: He has a normal mood and affect. His speech is normal and behavior is normal. Judgment and thought content normal. Cognition and memory are normal.  Nursing note and vitals reviewed.   Vital Signs: BP 133/63 (BP Location: Left Arm)   Pulse 62   Temp 99.1 F (37.3 C) (Oral)   Resp 18   Ht 5\' 9"  (1.753 m)   Wt 121.1 kg   SpO2 97%   BMI 39.43 kg/m  Pain Scale: 0-10 POSS *See Group Information*: S-Acceptable,Sleep, easy to arouse Pain Score: 0-No pain   SpO2: SpO2: 97 % O2 Device:SpO2: 97 % O2 Flow Rate: .O2 Flow Rate (L/min): 2 L/min  IO: Intake/output summary:   Intake/Output Summary (Last 24 hours) at 06/08/2018 1650 Last data filed at 06/08/2018 0558 Gross per 24 hour  Intake 3 ml  Output 500 ml  Net -497 ml    LBM: Last BM Date: 06/05/18 Baseline Weight: Weight: 124.7 kg Most recent weight: Weight: 121.1 kg     Palliative Assessment/Data: PPS 30%    Time In: 1545 Time Out: 1650 Time Total: 65 min.  Greater than 50%  of this time was spent counseling and coordinating care related to the above assessment and plan.  Signed by:  Alda Lea, AGPCNP-BC Palliative Medicine Team  Phone:  [phone] Fax: [phone] Pager: [phone] Amion: Bjorn Pippin    Please contact Palliative Medicine Team phone at [phone] for questions and concerns.  For individual provider: See Shea Evans

## 2018-06-08 NOTE — Clinical Social Work Note (Signed)
Clinical Social Work Assessment  Patient Details  Name: Daniel [name] MRN: [id number] Date of Birth: 1928/02/13  Date of referral:  06/08/18               Reason for consult:  Facility Placement                Permission sought to share information with:  Family Supports, Customer service manager Permission granted to share information::  Yes, Verbal Permission Granted  Name::     Doane,Kim Daughter [phone]  [phone] or Crawford,darlene Daughter   [phone]   Agency::  SNF admissions  Relationship::     Contact Information:     Housing/Transportation Living arrangements for the past 2 months:  [place] of Information:  Patient, Medical Team, Adult Children Patient Interpreter Needed:  None Criminal Activity/Legal Involvement Pertinent to Current Situation/Hospitalization:  No - Comment as needed Significant Relationships:  Adult Children Lives with:  Facility Resident Do you feel safe going back to the place where you live?  Yes Need for family participation in patient care:  Yes (Comment)  Care giving concerns:  Patient's family expressed some concerns about returning back to [facility] where patient is a long term care resident.     Social Worker assessment / plan:  Patient is an 84 year old male who is alert and oriented x3.  Patient is a long term care medicaid resident at [facility], where he has been for about 2.5 years.  Patient's family expressed some concerns about patient returning back to SNF and they were considering going to a different SNF, however CSW was not able to find another facility that is able to accept patient.  CSW spoke to family about looking at different options, and unfortunately patient did not have any other offers.  CSW explained role of CSW and process of looking for a different facility.  CSW discussed with patient's family and they agreed to return back to [facility].  CSW told patient's family they  need to address issues with SNF, and if the issues are still going on, then they can call the state and the ombudsman.  CSW provided contact information for [facility] in [place].  Patient and his family did not express any other questions or concerns and gave CSW permission to begin bed search in [place] and [place].  CSW updated [facility] with patient's concerns about him returning back to SNF.  Employment status:  Retired Forensic scientist:  Medicaid In [place], [employer] PT Recommendations:  Not assessed at this time Information / Referral to community resources:  [place]  Patient/Family's Response to care:  Patient's family is agreeable to returning back to [facility], [facility] is aware of concerns that patient's family had.  Patient/Family's Understanding of and Emotional Response to Diagnosis, Current Treatment, and Prognosis: Patient's family is aware of patient's treatment plan and prognosis.  They were very grateful of CSW service.  Emotional Assessment Appearance:  Appears stated age Attitude/Demeanor/Rapport:    Affect (typically observed):  Accepting, Appropriate, Stable Orientation:  Oriented to Self, Oriented to Place, Oriented to Situation Alcohol / Substance use:  Not Applicable Psych involvement (Current and /or in the community):  No (Comment)  Discharge Needs  Concerns to be addressed:  Care Coordination Readmission within the last 30 days:  No Current discharge risk:  None Barriers to Discharge:  Other(SNF not able to get CPAP in time.)   Anell Barr 06/08/2018, 5:35 PM

## 2018-06-08 NOTE — Discharge Summary (Addendum)
[facility] NAME: Daniel [name]    MR#:  [id number]  DATE OF BIRTH:  1927/09/19  DATE OF ADMISSION:  06/05/2018 ADMITTING PHYSICIAN: Demetrios Loll, MD  DATE OF DISCHARGE 06/08/2018  PRIMARY CARE PHYSICIAN: Glendale Chard, MD    ADMISSION DIAGNOSIS:  Bronchospasm [J98.01] Hypoxia [R09.02] Acute congestive heart failure, unspecified heart failure type ([place]) [I50.9]  DISCHARGE DIAGNOSIS:  Active Problems:   Acute respiratory failure with hypoxia ([place])   SECONDARY DIAGNOSIS:   Past Medical History:  Diagnosis Date  . Arthritis   . Chronic kidney disease   . Dementia ([place])   . Diabetes mellitus without complication (HCC)    Type 2  . Hyperlipidemia   . Hypertension   . Low back pain   . Muscle weakness (generalized)   . Other fracture of t5-T6 vertebra, subsequent encounter for fracture with routine healing   . Polyosteoarthritis   . Renal disorder   . Type 2 diabetes mellitus ([place])   . Unspecified fracture of t5-T6 vertebra, subsequent encounter for fracture with routine healing     HOSPITAL COURSE:   84 year old male with past medical history of hypertension, hyperlipidemia, diabetes, dementia, osteoarthritis who presented to the hospital due to shortness of breath.  1.  Acute respiratory failure with hypercapnia- secondary to underlying sleep apnea combined with mild CHF. -Patient was diuresed with IV Lasix, also placed on BiPAP.  With diuresis and on BiPAP patient's hypercapnia has significantly improved.  He is more awake and alert. - He would require CPAP at bedtime which is going to be arranged for him at the SNF.   2.  CHF-acute on chronic diastolic dysfunction. -Patient has clinically improved with IV diuresis.  He received IV Lasix in the hospital but will resume his Bumex upon discharge.  He will continue his Toprol.  3.  COPD- Mild acute exacerbation.  -Patient was transferred to the ICU for hypercapnia and  placed on BiPAP.  He has been weaned off BiPAP, hypercapnia has improved.  Patient was started on some IV steroids in the ICU and now is being discharged on a oral prednisone taper. - Patient will also continue some duo nebs as needed.  He will continue his Atrovent and albuterol inhaler as needed.  4.  Diabetes type 2 without complication- hospital patient was on his scheduled insulin along with sliding scale insulin. -Patient will resume his Levemir, sliding scale insulin, Tradjenta upon discharge along with his glimepiride.  5.  Essential hypertension- pt. Will continue Toprol, Norvasc.  6.  Dementia- pt. Will continue Namenda.  7.  Depression-pt. Will continue Lexapro.  8. OSA - pt. Will need CPAP at bedtime which is to be arranged at the Skilled nursing facility.   9. RUE swelling - pt. Had doppler of RUE which was (-) for DVT.    Stable to be discharged to SNF today.   DISCHARGE CONDITIONS:   Stable.   CONSULTS OBTAINED:  Treatment Team:  Corey Skains, MD  DRUG ALLERGIES:   Allergies  Allergen Reactions  . Oxycodone Nausea And Vomiting  . Codeine     DISCHARGE MEDICATIONS:   Allergies as of 06/08/2018      Reactions   Oxycodone Nausea And Vomiting   Codeine       Medication List    TAKE these medications   amLODipine 10 MG tablet Commonly known as:  NORVASC Take 10 mg by mouth daily.   atorvastatin 10 MG tablet Commonly known  as:  LIPITOR Take 10 mg by mouth daily.   bumetanide 0.5 MG tablet Commonly known as:  BUMEX Take 0.5 mg by mouth daily.   escitalopram 5 MG tablet Commonly known as:  LEXAPRO Take 5 mg by mouth daily.   fluticasone 50 MCG/ACT nasal spray Commonly known as:  FLONASE Place 2 sprays into both nostrils daily. What changed:  when to take this   glimepiride 4 MG tablet Commonly known as:  AMARYL Take 4 mg by mouth daily.   insulin aspart 100 UNIT/ML injection Commonly known as:  novoLOG Inject 3-11 Units into the  skin 4 (four) times daily -  before meals and at bedtime. Per sliding scale: 3 units = if blood sugar is 201-250 5 units = if blood sugar is 251-300 7 units = if blood sugar is 301-350 9 units = if blood sugar is 351-400 11 units = if blood sugar is greater than 400   insulin detemir 100 UNIT/ML injection Commonly known as:  LEVEMIR Inject 63 Units into the skin daily.   ipratropium 0.06 % nasal spray Commonly known as:  ATROVENT Place 2 sprays into both nostrils 4 (four) times daily. What changed:  when to take this   ipratropium-albuterol 0.5-2.5 (3) MG/3ML Soln Commonly known as:  DUONEB Take 3 mLs by nebulization every 6 (six) hours as needed.   linagliptin 5 MG Tabs tablet Commonly known as:  TRADJENTA Take 5 mg by mouth daily.   losartan 100 MG tablet Commonly known as:  COZAAR Take 100 mg by mouth daily.   metoprolol succinate 25 MG 24 hr tablet Commonly known as:  TOPROL-XL Take 37.5 mg by mouth 2 (two) times daily.   NAMENDA XR 28 MG Cp24 24 hr capsule Generic drug:  memantine Take 28 mg by mouth daily.   polyethylene glycol packet Commonly known as:  MIRALAX / GLYCOLAX Take 17 g by mouth daily.   predniSONE 10 MG tablet Commonly known as:  DELTASONE Label  & dispense according to the schedule below. 5 Pills PO for 1 day then, 4 Pills PO for 1 day, 3 Pills PO for 1 day, 2 Pills PO for 1 day, 1 Pill PO for 1 days then STOP.   senna-docusate 8.6-50 MG tablet Commonly known as:  Senokot-S Take 2 tablets by mouth 2 (two) times daily.   tizanidine 2 MG capsule Commonly known as:  ZANAFLEX Take 2 mg by mouth 2 (two) times daily.         DISCHARGE INSTRUCTIONS:   DIET:  Cardiac diet and Diabetic diet  DISCHARGE CONDITION:  Stable  ACTIVITY:  Activity as tolerated  OXYGEN:  Home Oxygen: No.   Oxygen Delivery: room air  DISCHARGE LOCATION:  nursing home   If you experience worsening of your admission symptoms, develop shortness of breath,  life threatening emergency, suicidal or homicidal thoughts you must seek medical attention immediately by calling 911 or calling your MD immediately  if symptoms less severe.  You Must read complete instructions/literature along with all the possible adverse reactions/side effects for all the Medicines you take and that have been prescribed to you. Take any new Medicines after you have completely understood and accpet all the possible adverse reactions/side effects.   Please note  You were cared for by a hospitalist during your hospital stay. If you have any questions about your discharge medications or the care you received while you were in the hospital after you are discharged, you can call the unit and  asked to speak with the hospitalist on call if the hospitalist that took care of you is not available. Once you are discharged, your primary care physician will handle any further medical issues. Please note that NO REFILLS for any discharge medications will be authorized once you are discharged, as it is imperative that you return to your primary care physician (or establish a relationship with a primary care physician if you do not have one) for your aftercare needs so that they can reassess your need for medications and monitor your lab values.     Today   No acute events overnight, shortness of breath improved, patient is more awake and alert.  Will discharge patient to skilled nursing facility today.  VITAL SIGNS:  Blood pressure 136/68, pulse (!) 58, temperature 99.1 F (37.3 C), temperature source Oral, resp. rate 17, height 5\' 9"  (1.753 m), weight 121.1 kg, SpO2 99 %.  I/O:    Intake/Output Summary (Last 24 hours) at 06/08/2018 1223 Last data filed at 06/08/2018 0558 Gross per 24 hour  Intake 243 ml  Output 700 ml  Net -457 ml    PHYSICAL EXAMINATION:   GENERAL:  84 y.o.-year-old obese patient lying in bed more awake and following commands.   EYES: Pupils equal, round,  reactive to light. No scleral icterus.  HEENT: Head atraumatic, normocephalic. Oropharynx and nasopharynx clear.  NECK:  Supple, no jugular venous distention. No thyroid enlargement, no tenderness.  LUNGS: Normal breath sounds bilaterally, no wheezing, rales, rhonchi. No use of accessory muscles of respiration.  CARDIOVASCULAR: S1, S2 normal. No murmurs, rubs, or gallops.  ABDOMEN: Soft, nontender, nondistended. Bowel sounds present. No organomegaly or mass.  EXTREMITIES: No cyanosis, clubbing, +1 edema b/l.    NEUROLOGIC: Cranial nerves II through XII are intact. No focal Motor or sensory deficits b/l. Globally weak but follows simple commands.  PSYCHIATRIC: The patient is alert and oriented x 2.  SKIN: No obvious rash, lesion, or ulcer.   DATA REVIEW:   CBC Recent Labs  Lab 06/08/18 0601  WBC 9.1  HGB 11.8*  HCT 36.5*  PLT 209    Chemistries  Recent Labs  Lab 06/05/18 1428  06/07/18 0503 06/08/18 0601  NA 140   < > 144 141  K 3.7   < > 4.5 4.8  CL 102   < > 104 101  CO2 29   < > 33* 33*  GLUCOSE 171*   < > 150* 125*  BUN 18   < > 29* 42*  CREATININE 1.33*   < > 1.27* 1.48*  CALCIUM 8.9   < > 9.0 8.9  MG  --    < > 2.3  --   AST 17  --   --   --   ALT 15  --   --   --   ALKPHOS 107  --   --   --   BILITOT 0.6  --   --   --    < > = values in this interval not displayed.    Cardiac Enzymes Recent Labs  Lab 06/05/18 1428  TROPONINI <0.03    Microbiology Results  Results for orders placed or performed during the hospital encounter of 06/05/18  Culture, blood (routine x 2)     Status: None (Preliminary result)   Collection Time: 06/05/18  2:28 PM  Result Value Ref Range Status   Specimen Description BLOOD BLOOD LEFT HAND  Final   Special Requests   Final  BOTTLES DRAWN AEROBIC AND ANAEROBIC Blood Culture adequate volume   Culture   Final    NO GROWTH 3 DAYS Performed at [facility], [address] [place], [place] [address]    Report Status  PENDING  Incomplete  Culture, blood (routine x 2)     Status: Abnormal   Collection Time: 06/05/18  2:28 PM  Result Value Ref Range Status   Specimen Description   Final    BLOOD RIGHT ANTECUBITAL Performed at [facility], [address], [place], [place] [address]    Special Requests   Final    BOTTLES DRAWN AEROBIC AND ANAEROBIC Blood Culture adequate volume Performed at [facility], [address] [place], [place] [address]    Culture  Setup Time   Final    GRAM POSITIVE COCCI IN BOTH AEROBIC AND ANAEROBIC BOTTLES CRITICAL RESULT CALLED TO, READ BACK BY AND VERIFIED WITH: DAVID BESANTI AT 4196 ON 06/06/18 [place].    Culture (A)  Final    STAPHYLOCOCCUS SPECIES (COAGULASE NEGATIVE) THE SIGNIFICANCE OF ISOLATING THIS ORGANISM FROM A SINGLE SET OF BLOOD CULTURES WHEN MULTIPLE SETS ARE DRAWN IS UNCERTAIN. PLEASE NOTIFY THE MICROBIOLOGY DEPARTMENT WITHIN ONE WEEK IF SPECIATION AND SENSITIVITIES ARE REQUIRED. Performed at [facility], [place] [address], [place], [place] [address]    Report Status 06/08/2018 FINAL  Final  Blood Culture ID Panel (Reflexed)     Status: Abnormal   Collection Time: 06/05/18  2:28 PM  Result Value Ref Range Status   Enterococcus species NOT DETECTED NOT DETECTED Final   Listeria monocytogenes NOT DETECTED NOT DETECTED Final   Staphylococcus species DETECTED (A) NOT DETECTED Final    Comment: Methicillin (oxacillin) susceptible coagulase negative staphylococcus. Possible blood culture contaminant (unless isolated from more than one blood culture draw or clinical case suggests pathogenicity). No antibiotic treatment is indicated for blood  culture contaminants. CRITICAL RESULT CALLED TO, READ BACK BY AND VERIFIED WITH: DAVID BESANTI AT 9892 ON 06/06/18 [place].    Staphylococcus aureus (BCID) NOT DETECTED NOT DETECTED Final   Methicillin resistance NOT DETECTED NOT DETECTED Final   Streptococcus species NOT DETECTED NOT DETECTED Final    Streptococcus agalactiae NOT DETECTED NOT DETECTED Final   Streptococcus pneumoniae NOT DETECTED NOT DETECTED Final   Streptococcus pyogenes NOT DETECTED NOT DETECTED Final   Acinetobacter baumannii NOT DETECTED NOT DETECTED Final   Enterobacteriaceae species NOT DETECTED NOT DETECTED Final   Enterobacter cloacae complex NOT DETECTED NOT DETECTED Final   Escherichia coli NOT DETECTED NOT DETECTED Final   Klebsiella oxytoca NOT DETECTED NOT DETECTED Final   Klebsiella pneumoniae NOT DETECTED NOT DETECTED Final   Proteus species NOT DETECTED NOT DETECTED Final   Serratia marcescens NOT DETECTED NOT DETECTED Final   Haemophilus influenzae NOT DETECTED NOT DETECTED Final   Neisseria meningitidis NOT DETECTED NOT DETECTED Final   Pseudomonas aeruginosa NOT DETECTED NOT DETECTED Final   Candida albicans NOT DETECTED NOT DETECTED Final   Candida glabrata NOT DETECTED NOT DETECTED Final   Candida krusei NOT DETECTED NOT DETECTED Final   Candida parapsilosis NOT DETECTED NOT DETECTED Final   Candida tropicalis NOT DETECTED NOT DETECTED Final    Comment: Performed at [facility], [address] [place], [place] [address]  MRSA PCR Screening     Status: None   Collection Time: 06/06/18  1:05 PM  Result Value Ref Range Status   MRSA by PCR NEGATIVE NEGATIVE Final    Comment:  The GeneXpert MRSA Assay (FDA approved for NASAL specimens only), is one component of a comprehensive MRSA colonization surveillance program. It is not intended to diagnose MRSA infection nor to guide or monitor treatment for MRSA infections. Performed at [facility], [address], [place], [place] [address]     RADIOLOGY:  No results found.    Management plans discussed with the patient, family and they are in agreement.  CODE STATUS:     Code Status Orders  (From admission, onward)         Start     Ordered   06/05/18 1828  Full code  Continuous     06/05/18 1827           TOTAL TIME TAKING CARE OF THIS PATIENT: 40 minutes.    Henreitta Leber M.D on 06/08/2018 at 12:23 PM  Between 7am to 6pm - Pager - [phone]  After 6pm go to [email or web address] - Proofreader  [facility]  Office  [phone]  CC: Primary care physician; Glendale Chard, MD

## 2018-06-09 ENCOUNTER — Inpatient Hospital Stay: Payer: Medicare HMO

## 2018-06-09 ENCOUNTER — Ambulatory Visit: Payer: Medicare HMO | Admitting: Nurse Practitioner

## 2018-06-09 LAB — BASIC METABOLIC PANEL
Anion gap: 7 (ref 5–15)
BUN: 43 mg/dL — ABNORMAL HIGH (ref 8–23)
CHLORIDE: 100 mmol/L (ref 98–111)
CO2: 33 mmol/L — ABNORMAL HIGH (ref 22–32)
CREATININE: 1.36 mg/dL — AB (ref 0.61–1.24)
Calcium: 8.8 mg/dL — ABNORMAL LOW (ref 8.9–10.3)
GFR calc Af Amer: 52 mL/min — ABNORMAL LOW (ref >60)
GFR calc non Af Amer: 45 mL/min — ABNORMAL LOW (ref >60)
Glucose, Bld: 160 mg/dL — ABNORMAL HIGH (ref 70–99)
Potassium: 4.6 mmol/L (ref 3.5–5.1)
Sodium: 140 mmol/L (ref 135–145)

## 2018-06-09 LAB — GLUCOSE, CAPILLARY
GLUCOSE-CAPILLARY: 174 mg/dL — AB (ref 70–99)
GLUCOSE-CAPILLARY: 290 mg/dL — AB (ref 70–99)

## 2018-06-09 NOTE — Progress Notes (Signed)
[place] [facility] [place] NAME: Daniel [name]    MR#:  [id number]  DATE OF BIRTH:  [birth year]  SUBJECTIVE:   Pt. is much more awake and alert and following commands.  No other acute events overnight. No shortness of breath, N/V or Chest pain.   REVIEW OF SYSTEMS:    Review of Systems  Constitutional: Negative for chills and fever.  HENT: Negative for congestion and tinnitus.   Eyes: Negative for blurred vision and double vision.  Respiratory: Negative for cough, shortness of breath and wheezing.   Cardiovascular: Negative for chest pain, orthopnea and PND.  Gastrointestinal: Negative for abdominal pain, diarrhea, nausea and vomiting.  Genitourinary: Negative for dysuria and hematuria.  Neurological: Negative for dizziness, sensory change and focal weakness.  All other systems reviewed and are negative.   Nutrition: Heart Healthy/Carb modified Tolerating Diet: Yes Tolerating PT:  Pt. Is bedbound at baseline.   DRUG ALLERGIES:   Allergies  Allergen Reactions  . Oxycodone Nausea And Vomiting  . Codeine     VITALS:  Blood pressure (!) 156/82, pulse 66, temperature 98.1 F (36.7 C), temperature source Oral, resp. rate 17, height 5\' 9"  (1.753 m), weight 120 kg, SpO2 98 %.  PHYSICAL EXAMINATION:   Physical Exam  GENERAL:  84 y.o.-year-old obese patient lying in bed awake and following commands. EYES: Pupils equal, round, reactive to light. No scleral icterus.  HEENT: Head atraumatic, normocephalic. Oropharynx and nasopharynx clear.  NECK:  Supple, no jugular venous distention. No thyroid enlargement, no tenderness.  LUNGS: Normal breath sounds bilaterally, no wheezing, rales, rhonchi. No use of accessory muscles of respiration.  CARDIOVASCULAR: S1, S2 normal. No murmurs, rubs, or gallops.  ABDOMEN: Soft, nontender, nondistended. Bowel sounds present. No organomegaly or mass.  EXTREMITIES: No cyanosis, clubbing, +1 edema b/l.      NEUROLOGIC: Cranial nerves II through XII are intact. No focal Motor or sensory deficits b/l. Globally weak but follows simple commands.  PSYCHIATRIC: The patient is alert and oriented x 2.  SKIN: No obvious rash, lesion, or ulcer.    LABORATORY PANEL:   CBC Recent Labs  Lab 06/08/18 0601  WBC 9.1  HGB 11.8*  HCT 36.5*  PLT 209   ------------------------------------------------------------------------------------------------------------------  Chemistries  Recent Labs  Lab 06/05/18 1428  06/07/18 0503  06/09/18 0429  NA 140   < > 144   < > 140  K 3.7   < > 4.5   < > 4.6  CL 102   < > 104   < > 100  CO2 29   < > 33*   < > 33*  GLUCOSE 171*   < > 150*   < > 160*  BUN 18   < > 29*   < > 43*  CREATININE 1.33*   < > 1.27*   < > 1.36*  CALCIUM 8.9   < > 9.0   < > 8.8*  MG  --    < > 2.3  --   --   AST 17  --   --   --   --   ALT 15  --   --   --   --   ALKPHOS 107  --   --   --   --   BILITOT 0.6  --   --   --   --    < > = values in this interval not displayed.   ------------------------------------------------------------------------------------------------------------------  Cardiac Enzymes Recent Labs  Lab 06/05/18 1428  TROPONINI <0.03   ------------------------------------------------------------------------------------------------------------------  RADIOLOGY:  US Venous Img Upper Uni Right  Result Date: 06/09/2018 CLINICAL DATA:  Right upper extremity pain and edema for the past month. History of diabetes. Evaluate for DVT. EXAM: RIGHT UPPER EXTREMITY VENOUS DOPPLER ULTRASOUND TECHNIQUE: Gray-scale sonography with graded compression, as well as color Doppler and duplex ultrasound were performed to evaluate the upper extremity deep venous system from the level of the subclavian vein and including the jugular, axillary, basilic, radial, ulnar and upper cephalic vein. Spectral Doppler was utilized to evaluate flow at rest and with distal augmentation maneuvers.  COMPARISON:  None. FINDINGS: Contralateral Subclavian Vein: Respiratory phasicity is normal and symmetric with the symptomatic side. No evidence of thrombus. Normal compressibility. Internal Jugular Vein: No evidence of thrombus. Normal compressibility, respiratory phasicity and response to augmentation. Subclavian Vein: No evidence of thrombus. Normal compressibility, respiratory phasicity and response to augmentation. Axillary Vein: No evidence of thrombus. Normal compressibility, respiratory phasicity and response to augmentation. Cephalic Vein: No evidence of thrombus. Normal compressibility, respiratory phasicity and response to augmentation. Basilic Vein: No evidence of thrombus. Normal compressibility, respiratory phasicity and response to augmentation. Brachial Veins: No evidence of thrombus. Normal compressibility, respiratory phasicity and response to augmentation. Radial Veins: No evidence of thrombus. Normal compressibility, respiratory phasicity and response to augmentation. Ulnar Veins: No evidence of thrombus. Normal compressibility, respiratory phasicity and response to augmentation. Venous Reflux:  None visualized. Other Findings:  None visualized. IMPRESSION: No evidence of DVT within the right upper extremity. Electronically Signed   By: Sandi Mariscal M.D.   On: 06/09/2018 13:03     ASSESSMENT AND PLAN:   84 year old male with past medical history of hypertension, hyperlipidemia, diabetes, dementia, osteoarthritis who presented to the hospital due to shortness of breath.  1.  Acute respiratory failure with hypercapnia-secondary to possible underlying sleep apnea with mild CHF.   Much improved with BiPAP and patient is more awake and alert today.  BiPAP weaned off.  2.  CHF-acute on chronic diastolic dysfunction. -Clinically improved, diuresed with IV lasix and about 1.7 L negative since admission.  Patient is improving.   -Continue Toprol.  3.  COPD- mild acute exacerbation given the  hypercapnic respiratory failure. -Weaned off BiPAP and much more awake today.  Continue IV steroids and cont. To taper, continue scheduled duo nebs, Pulmicort nebs.  4.  Diabetes type 2 without complication-continue sliding scale insulin, follow blood sugars are currently stable.  5.  Essential hypertension-continue Toprol, Norvasc.  6.  Dementia-continue Namenda.  7.  Depression-continue Lexapro.  he does not want to go back to his skilled nursing facility, social work doing bed search.  Possible discharge to SNF in the next 1 to 2 days.   All the records are reviewed and case discussed with Care Management/Social Worker. Management plans discussed with the patient, family and they are in agreement.  CODE STATUS: Full code  DVT Prophylaxis: Hep. SQ  TOTAL Critical Care TIME TAKING CARE OF THIS PATIENT: 30 minutes.   POSSIBLE D/C IN 1-2 DAYS, DEPENDING ON CLINICAL CONDITION.   Henreitta Leber M.D on 06/09/2018 at 2:17 PM  Between 7am to 6pm - Pager - [phone]  After 6pm go to [email or web address] - Proofreader  [facility]  Office  [phone]  CC: Primary care physician; Glendale Chard, MD

## 2018-06-09 NOTE — Progress Notes (Signed)
Patient is medically stable for D/C back to [facility] today. Per Otila Kluver [facility] liaison they have cpap for patient and cleaning supplies for cpap. Per Otila Kluver patient can come today to room 304-A. RN will call report and arrange EMS for transport. Clinical Education officer, museum (CSW) sent D/C orders to [facility] via [facility]. Patient is aware of above. CSW contacted patient's daughter Maudie Mercury and made her aware of above. Please reocnsult if future social work needs arise. CSW signing off.   [employer], [name] [phone]

## 2018-06-09 NOTE — Evaluation (Signed)
Clinical/Bedside Swallow Evaluation Patient Details  Name: Daniel [name] MRN: [id number] Date of Birth: 29-Jun-1928  Today's Date: 06/09/2018 Time: SLP Start Time (ACUTE ONLY): 1505 SLP Stop Time (ACUTE ONLY): 1540 SLP Time Calculation (min) (ACUTE ONLY): 35 min  Past Medical History:  Past Medical History:  Diagnosis Date  . Arthritis   . Chronic kidney disease   . Dementia ([place])   . Diabetes mellitus without complication (HCC)    Type 2  . Hyperlipidemia   . Hypertension   . Low back pain   . Muscle weakness (generalized)   . Other fracture of t5-T6 vertebra, subsequent encounter for fracture with routine healing   . Polyosteoarthritis   . Renal disorder   . Type 2 diabetes mellitus ([place])   . Unspecified fracture of t5-T6 vertebra, subsequent encounter for fracture with routine healing    Past Surgical History:  Past Surgical History:  Procedure Laterality Date  . BACK SURGERY    . CHOLECYSTECTOMY    . HERNIA REPAIR    . KNEE SURGERY    . teeth pulled  Bilateral 08/13/14   teeth pulled and bottom gum removed   HPI:  Per admitting H&P: Daniel [name]  is a 84 y.o. male with a known history of CKD, dementia, diabetes, hypertension, hyperlipidemia, arthritis, back pain, muscle weakness.  The patient presents the ED with worsening shortness of breath today.  He said he has had leg swelling for the past 3 weeks.  He also complains of wheezing and a cough.  He denies any fever or chills, no chest pain no palpitation.  He was found hypoxia at 80s in the ED, put on oxygen by nasal cannula.  He regularly used wheelchair and does not walk.  Chest x-ray show hyperventilation and atelectasis changes.  He is suspected CHF and given Lasix IV.   Assessment / Plan / Recommendation Clinical Impression  Patient presents with largely functional swallowing abilities at bedside with no IMMEDIATE overt s/s aspiration with any consistency tested. Patient observed with cough prior to PO's and  intermittently throughout meal; however not immediately following PO's. Coughing did not appear to be related to PO intake. Oral mech exam and oral phase of swallow WFL, it should be noted patient has had part of his lower gum and teeth removed due to goiter. Patient does not have dentures available, they are at SNF and are awaiting adjustment per family report. Adequate oral prep/coordination and A-P transit. No oral residue noted. Swallow initiation appeared timely. Vocal quality remained clear throughout evaluation. Did note s/s of possible esophageal dysphagia c/b consistent belching throughout meal and gurgling sound post sips of thin liquid. Patient also observed to eat at a fast rate and take large bites. Recommend supervision with meals to ensure small bites, slow rate of eating, and alternating sips of liquid with small bites of solids to decrease risk of aspiration and facilitate esophageal clearance. WITHOUT adherence to swallow rec's, patient at increased risk of ASPIRATION. Recommend continue with current regular diet with thin liquids with STRICT adherence to swallow rec's. MD may consider esophageal assessment to further investigate s/s of esophageal dysphagia. SLP to f/u with toleration of diet. Nursing and patient informed. SLP Visit Diagnosis: Dysphagia, unspecified (R13.10)    Aspiration Risk  Mild aspiration risk    Diet Recommendation Regular;Thin liquid   Liquid Administration via: Cup Medication Administration: Whole meds with liquid Supervision: Full supervision/cueing for compensatory strategies Compensations: Minimize environmental distractions;Slow rate;Small sips/bites;Follow solids with liquid Postural Changes: Seated upright  at 90 degrees;Remain upright for at least 30 minutes after po intake    Other  Recommendations Recommended Consults: Consider esophageal assessment Oral Care Recommendations: Oral care BID   Follow up Recommendations        Frequency and Duration  min 2x/week  1 week       Prognosis Prognosis for Safe Diet Advancement: Good Barriers to Reach Goals: Cognitive deficits      Swallow Study   General Date of Onset: 06/09/18 HPI: Per admitting H&P: Daniel [name]  is a 84 y.o. male with a known history of CKD, dementia, diabetes, hypertension, hyperlipidemia, arthritis, back pain, muscle weakness.  The patient presents the ED with worsening shortness of breath today.  He said he has had leg swelling for the past 3 weeks.  He also complains of wheezing and a cough.  He denies any fever or chills, no chest pain no palpitation.  He was found hypoxia at 80s in the ED, put on oxygen by nasal cannula.  He regularly used wheelchair and does not walk.  Chest x-ray show hyperventilation and atelectasis changes.  He is suspected CHF and given Lasix IV. Type of Study: Bedside Swallow Evaluation Diet Prior to this Study: Regular;Thin liquids Temperature Spikes Noted: No Respiratory Status: Nasal cannula History of Recent Intubation: No Behavior/Cognition: Alert;Cooperative;Pleasant mood Oral Cavity Assessment: Within Functional Limits Oral Care Completed by SLP: No Oral Cavity - Dentition: Edentulous Vision: Functional for self-feeding Self-Feeding Abilities: Able to feed self Patient Positioning: Upright in bed Baseline Vocal Quality: Normal Volitional Cough: Strong Volitional Swallow: Able to elicit    Oral/Motor/Sensory Function Overall Oral Motor/Sensory Function: Within functional limits   Ice Chips Ice chips: Not tested   Thin Liquid Thin Liquid: Within functional limits    Nectar Thick Nectar Thick Liquid: Not tested   Honey Thick Honey Thick Liquid: Not tested   Puree Puree: Within functional limits   Solid     Solid: Within functional limits      Avenir Lozinski, MA, CCC-SLP 06/09/2018,3:47 PM

## 2018-06-09 NOTE — Plan of Care (Signed)
  Problem: Clinical Measurements: Goal: Respiratory complications will improve Outcome: Progressing   Problem: Elimination: Goal: Will not experience complications related to bowel motility Outcome: Progressing

## 2018-06-09 NOTE — Progress Notes (Signed)
Pharmacy Electrolyte and Glucose Monitoring Consult:  Pharmacy consulted to assist in monitoring and replacing electrolytes and glucose management in this 84 y.o. male admitted on 06/05/2018 with hypercapnic respiratory failure.   Labs:  Sodium (mmol/L)  Date Value  06/09/2018 140  11/20/2012 135 (L)   Potassium (mmol/L)  Date Value  06/09/2018 4.6  11/20/2012 3.7   Magnesium (mg/dL)  Date Value  06/07/2018 2.3   Calcium (mg/dL)  Date Value  06/09/2018 8.8 (L)   Calcium, Total (mg/dL)  Date Value  11/20/2012 8.9   Albumin (g/dL)  Date Value  06/05/2018 3.5  11/20/2012 3.5    Assessment/Plan: Glucose:  Patient initiated on SSI. Patient received Levemir 63 units at 2304 on 11/5 and Levemir 63 units at [facility] on 11/5. Marland KitchenLevemir was discontinued and will restart today @ 1500. Patient is ordered methylprednisolone 40mg  IV Q12hr. Will continue to monitor.   Electrolytes:  Patient receiving furosemide 40mg  IV daily.  Electrolyte replacement not warranted at this time. Will follow up on already ordered AM labs. If tomorrow AM labs are in range can move to less frequent monitoring.   Pharmacy will continue to monitor and adjust per consult.    Paulina Fusi, PharmD, BCPS 06/09/2018 1:40 PM

## 2018-06-09 NOTE — Progress Notes (Signed)
Pt will discharge to [facility]. IV discontinued without incident, telemetry box returned to clerk. Report called to Lauren at 1640 and EMS notified of transportation. I will continue to assess.

## 2018-06-09 NOTE — Progress Notes (Signed)
SLP Cancellation Note  Patient Details Name: Daniel [name] MRN: [id number] DOB: 1927-08-26   Cancelled treatment:       Reason Eval/Treat Not Completed: Patient at procedure or test/unavailable; Reviewed chart, spoke with family in room, who stated patient is off the floor to Ultrasound. Family reports patient has a good appetite and reports he is tolerating his Regular diet with thin liquids well. Family deny observing any immediate  s/s aspiration during meals but report he has an intermittent cough throughout the day. Nursing reports observing coughing during breakfast this morning and requested SLP consult to further investigate. Will re-attempt later today.   Ladena Jacquez, MA, CCC-SLP 06/09/2018, 11:11 AM

## 2018-06-10 LAB — CULTURE, BLOOD (ROUTINE X 2)
CULTURE: NO GROWTH
SPECIAL REQUESTS: ADEQUATE

## 2018-06-13 DIAGNOSIS — I509 Heart failure, unspecified: Secondary | ICD-10-CM | POA: Diagnosis not present

## 2018-06-13 DIAGNOSIS — I10 Essential (primary) hypertension: Secondary | ICD-10-CM | POA: Diagnosis not present

## 2018-06-13 DIAGNOSIS — M6281 Muscle weakness (generalized): Secondary | ICD-10-CM | POA: Diagnosis not present

## 2018-06-14 DIAGNOSIS — I509 Heart failure, unspecified: Secondary | ICD-10-CM | POA: Diagnosis not present

## 2018-06-14 DIAGNOSIS — I10 Essential (primary) hypertension: Secondary | ICD-10-CM | POA: Diagnosis not present

## 2018-06-14 DIAGNOSIS — M6281 Muscle weakness (generalized): Secondary | ICD-10-CM | POA: Diagnosis not present

## 2018-06-15 ENCOUNTER — Telehealth: Payer: Self-pay | Admitting: Family

## 2018-06-15 ENCOUNTER — Ambulatory Visit: Payer: Medicare HMO | Admitting: Family

## 2018-06-15 DIAGNOSIS — M6281 Muscle weakness (generalized): Secondary | ICD-10-CM | POA: Diagnosis not present

## 2018-06-15 DIAGNOSIS — F0390 Unspecified dementia without behavioral disturbance: Secondary | ICD-10-CM | POA: Diagnosis not present

## 2018-06-15 DIAGNOSIS — K5900 Constipation, unspecified: Secondary | ICD-10-CM | POA: Diagnosis not present

## 2018-06-15 DIAGNOSIS — J9600 Acute respiratory failure, unspecified whether with hypoxia or hypercapnia: Secondary | ICD-10-CM | POA: Diagnosis not present

## 2018-06-15 DIAGNOSIS — I272 Pulmonary hypertension, unspecified: Secondary | ICD-10-CM | POA: Diagnosis not present

## 2018-06-15 DIAGNOSIS — Z515 Encounter for palliative care: Secondary | ICD-10-CM | POA: Diagnosis not present

## 2018-06-15 DIAGNOSIS — E785 Hyperlipidemia, unspecified: Secondary | ICD-10-CM | POA: Diagnosis not present

## 2018-06-15 DIAGNOSIS — I509 Heart failure, unspecified: Secondary | ICD-10-CM | POA: Diagnosis not present

## 2018-06-15 DIAGNOSIS — E119 Type 2 diabetes mellitus without complications: Secondary | ICD-10-CM | POA: Diagnosis not present

## 2018-06-15 DIAGNOSIS — I10 Essential (primary) hypertension: Secondary | ICD-10-CM | POA: Diagnosis not present

## 2018-06-15 NOTE — Telephone Encounter (Signed)
Patient did not show for his [facility] Failure [facility] appointment on 06/15/18. Will attempt to reschedule.

## 2018-06-16 ENCOUNTER — Ambulatory Visit: Payer: Medicare HMO | Attending: Family | Admitting: Family

## 2018-06-16 ENCOUNTER — Encounter: Payer: Self-pay | Admitting: Family

## 2018-06-16 DIAGNOSIS — Z8249 Family history of ischemic heart disease and other diseases of the circulatory system: Secondary | ICD-10-CM | POA: Insufficient documentation

## 2018-06-16 DIAGNOSIS — E1122 Type 2 diabetes mellitus with diabetic chronic kidney disease: Secondary | ICD-10-CM | POA: Diagnosis not present

## 2018-06-16 DIAGNOSIS — M6281 Muscle weakness (generalized): Secondary | ICD-10-CM | POA: Diagnosis not present

## 2018-06-16 DIAGNOSIS — Z9889 Other specified postprocedural states: Secondary | ICD-10-CM | POA: Diagnosis not present

## 2018-06-16 DIAGNOSIS — I10 Essential (primary) hypertension: Secondary | ICD-10-CM | POA: Diagnosis not present

## 2018-06-16 DIAGNOSIS — E785 Hyperlipidemia, unspecified: Secondary | ICD-10-CM | POA: Insufficient documentation

## 2018-06-16 DIAGNOSIS — N183 Chronic kidney disease, stage 3 (moderate): Secondary | ICD-10-CM

## 2018-06-16 DIAGNOSIS — Z79899 Other long term (current) drug therapy: Secondary | ICD-10-CM | POA: Insufficient documentation

## 2018-06-16 DIAGNOSIS — N189 Chronic kidney disease, unspecified: Secondary | ICD-10-CM | POA: Insufficient documentation

## 2018-06-16 DIAGNOSIS — R0602 Shortness of breath: Secondary | ICD-10-CM | POA: Diagnosis present

## 2018-06-16 DIAGNOSIS — F0390 Unspecified dementia without behavioral disturbance: Secondary | ICD-10-CM | POA: Diagnosis not present

## 2018-06-16 DIAGNOSIS — I890 Lymphedema, not elsewhere classified: Secondary | ICD-10-CM | POA: Diagnosis not present

## 2018-06-16 DIAGNOSIS — Z9049 Acquired absence of other specified parts of digestive tract: Secondary | ICD-10-CM | POA: Diagnosis not present

## 2018-06-16 DIAGNOSIS — Z794 Long term (current) use of insulin: Secondary | ICD-10-CM | POA: Insufficient documentation

## 2018-06-16 DIAGNOSIS — Z885 Allergy status to narcotic agent status: Secondary | ICD-10-CM | POA: Diagnosis not present

## 2018-06-16 DIAGNOSIS — I130 Hypertensive heart and chronic kidney disease with heart failure and stage 1 through stage 4 chronic kidney disease, or unspecified chronic kidney disease: Secondary | ICD-10-CM | POA: Diagnosis not present

## 2018-06-16 DIAGNOSIS — M159 Polyosteoarthritis, unspecified: Secondary | ICD-10-CM | POA: Insufficient documentation

## 2018-06-16 DIAGNOSIS — I5032 Chronic diastolic (congestive) heart failure: Secondary | ICD-10-CM | POA: Insufficient documentation

## 2018-06-16 DIAGNOSIS — I509 Heart failure, unspecified: Secondary | ICD-10-CM | POA: Insufficient documentation

## 2018-06-16 DIAGNOSIS — E119 Type 2 diabetes mellitus without complications: Secondary | ICD-10-CM | POA: Insufficient documentation

## 2018-06-16 DIAGNOSIS — E1165 Type 2 diabetes mellitus with hyperglycemia: Secondary | ICD-10-CM | POA: Diagnosis not present

## 2018-06-16 LAB — GLUCOSE, CAPILLARY: Glucose-Capillary: 280 mg/dL — ABNORMAL HIGH (ref 70–99)

## 2018-06-16 NOTE — Patient Instructions (Addendum)
Begin weighing daily and call for an overnight weight gain of > 2 pounds or a weekly weight gain of >5 pounds. 

## 2018-06-16 NOTE — Progress Notes (Signed)
Patient ID: Daniel [name], male    DOB: 09/06/1927, 84 y.o.   MRN: [id number]  HPI  Daniel [name] is a 84 y/o male with a history of DM, hyperlipidemia, HTN, CKD, arthritis, dementia and chronic heart failure.   Echo report from 06/06/18 reviewed and showed an EF of 55-65%.  Admitted 06/05/18 due to acute on chronic HF. Initially needed IV lasix along with bipap. Transitioned to oral diuretics and weaned off bipap. Palliative care and cardiology consults were obtained. Discharged to SNF after 4 days.   He presents today for his initial visit with a chief complaint of moderate shortness of breath upon minimal exertion. He describes this as chronic having been present for many months although he's unsure of length of time. He has associated fatigue, cough, pedal edema, back pain, difficulty sleeping and memory loss. He denies any abdominal distention, palpitations, chest pain or dizziness. He's unsure if he's getting weighed daily. Describes what sounds like oxygen at night and not CPAP as he's insistent that he wears the "things in the nose and not a mask".   Past Medical History:  Diagnosis Date  . Arthritis   . CHF (congestive heart failure) ([place])   . Chronic kidney disease   . Dementia ([place])   . Diabetes mellitus without complication (HCC)    Type 2  . Hyperlipidemia   . Hypertension   . Low back pain   . Muscle weakness (generalized)   . Other fracture of t5-T6 vertebra, subsequent encounter for fracture with routine healing   . Polyosteoarthritis   . Renal disorder   . Type 2 diabetes mellitus ([place])   . Unspecified fracture of t5-T6 vertebra, subsequent encounter for fracture with routine healing    Past Surgical History:  Procedure Laterality Date  . BACK SURGERY    . CHOLECYSTECTOMY    . HERNIA REPAIR    . KNEE SURGERY    . teeth pulled  Bilateral 08/13/14   teeth pulled and bottom gum removed   Family History  Problem Relation Age of Onset  . Hypertension Father    Social  History   Tobacco Use  . Smoking status: Never Smoker  . Smokeless tobacco: Never Used  Substance Use Topics  . Alcohol use: No   Allergies  Allergen Reactions  . Oxycodone Nausea And Vomiting  . Codeine    Prior to Admission medications   Medication Sig Start Date End Date Taking? Authorizing Provider  amLODipine (NORVASC) 10 MG tablet Take 10 mg by mouth daily.   Yes [provider]  atorvastatin (LIPITOR) 10 MG tablet Take 10 mg by mouth daily.    Yes [provider]  bumetanide (BUMEX) 0.5 MG tablet Take 0.5 mg by mouth daily.   Yes [provider]  escitalopram (LEXAPRO) 5 MG tablet Take 5 mg by mouth daily.   Yes [provider]  fluticasone (FLONASE) 50 MCG/ACT nasal spray Place 2 sprays into both nostrils daily. Patient taking differently: Place 2 sprays into both nostrils at bedtime.  09/30/17  Yes Yu, Amy V, PA-C  glimepiride (AMARYL) 4 MG tablet Take 4 mg by mouth daily.   Yes [provider]  insulin aspart (NOVOLOG) 100 UNIT/ML injection Inject 3-11 Units into the skin 4 (four) times daily -  before meals and at bedtime. Per sliding scale: 3 units = if blood sugar is 201-250 5 units = if blood sugar is 251-300 7 units = if blood sugar is 301-350 9 units = if  blood sugar is 351-400 11 units = if blood sugar is greater than 400   Yes [provider]  insulin detemir (LEVEMIR) 100 UNIT/ML injection Inject 63 Units into the skin daily.   Yes [provider]  ipratropium (ATROVENT) 0.06 % nasal spray Place 2 sprays into both nostrils 4 (four) times daily. Patient taking differently: Place 2 sprays into both nostrils 3 (three) times daily.  09/30/17  Yes Yu, Amy V, PA-C  ipratropium-albuterol (DUONEB) 0.5-2.5 (3) MG/3ML SOLN Take 3 mLs by nebulization every 6 (six) hours as needed. 06/08/18  Yes Sainani, Belia Heman, MD  linagliptin (TRADJENTA) 5 MG TABS tablet Take 5 mg by mouth daily.   Yes [provider]   losartan (COZAAR) 100 MG tablet Take 100 mg by mouth daily.   Yes [provider]  memantine (NAMENDA XR) 28 MG CP24 24 hr capsule Take 28 mg by mouth daily.   Yes [provider]  metoprolol succinate (TOPROL-XL) 25 MG 24 hr tablet Take 37.5 mg by mouth 2 (two) times daily.   Yes [provider]  polyethylene glycol (MIRALAX / GLYCOLAX) packet Take 17 g by mouth daily.   Yes [provider]  senna-docusate (SENOKOT-S) 8.6-50 MG tablet Take 2 tablets by mouth 2 (two) times daily.   Yes [provider]  tizanidine (ZANAFLEX) 2 MG capsule Take 2 mg by mouth 2 (two) times daily.   Yes [provider]    Review of Systems  Constitutional: Positive for fatigue. Negative for appetite change.  HENT: Positive for rhinorrhea. Negative for congestion and sore throat.   Eyes: Negative.   Respiratory: Positive for cough (improving) and shortness of breath (with minimal exertion). Negative for chest tightness.   Cardiovascular: Positive for leg swelling. Negative for chest pain and palpitations.  Gastrointestinal: Negative for abdominal distention and abdominal pain.  Endocrine: Negative.   Genitourinary: Negative.   Musculoskeletal: Positive for back pain. Negative for neck pain.  Skin: Negative.   Allergic/Immunologic: Negative.   Neurological: Negative for dizziness and light-headedness.  Hematological: Negative for adenopathy. Does not bruise/bleed easily.  Psychiatric/Behavioral: Positive for confusion (at times) and sleep disturbance (sleeping on 2 pillows with oxygen).    Vitals:   06/16/18 0957  BP: (!) 105/51  Pulse: (!) 58  Resp: 18  SpO2: 92%  Weight: 250 lb (113.4 kg)  Height: 5\' 9"  (1.753 m)   Wt Readings from Last 3 Encounters:  06/16/18 250 lb (113.4 kg)  06/09/18 264 lb 8.8 oz (120 kg)  12/06/17 250 lb (113.4 kg)   Lab Results  Component Value Date   CREATININE 1.36 (H) 06/09/2018   CREATININE 1.48 (H) 06/08/2018    CREATININE 1.27 (H) 06/07/2018   Physical Exam  Constitutional: He is oriented to person, place, and time. He appears well-developed and well-nourished.  HENT:  Head: Normocephalic and atraumatic.  Neck: Normal range of motion. Neck supple. No JVD present.  Cardiovascular: Regular rhythm. Bradycardia present.  Pulmonary/Chest: Effort normal. No respiratory distress. He has no wheezes. He has no rales.  Abdominal: Soft. He exhibits no distension.  Musculoskeletal: He exhibits edema (2+ pitting bilateral lower legs). He exhibits no tenderness.  Neurological: He is alert and oriented to person, place, and time.  Skin: Skin is warm and dry.  Psychiatric: He has a normal mood and affect. His behavior is normal.  Nursing note and vitals reviewed.  Assessment & Plan:  1: Chronic heart failure with preserved ejection fraction- - NYHA class III -  moderately fluid overloaded with pedal edema - order written for him to be weighed daily and for facility to call for an overnight weight gain of >2 pounds or a weekly weight gain of >5 pounds - may need to adjust diuretic if edema persists after compression socks - not adding salt and caregiver from facility doesn't think the food is cooked with salt - receiving PT although patient says that "they want me to do everything for myself". Explained that he needed to keep using his muscles or he would continue to lose muscle strength - BNP 06/05/18 was 43.0 - caregiver thinks that patient has received his flu vaccine - sounds like he's wearing oxygen at night and not CPAP so order was written for CPAP to get started (order was already on facility order sheet)  2: HTN- - BP looks good although on the low side - sees PCP at [facility] - BMP from 06/09/18 reviewed and showed sodium 140, potassium 4.6, creatinine 1.36 and GFR 52  3: DM- - nonfasting glucose in clinic today was 280 - A1c 07/31/12 was 8.0%  4: Lymphedema- - stage 2 - sits in his  wheelchair or in the bed; has legs elevated in the bed - limited in his ability to exercise due to deconditioning - order written for compression socks to be applied daily with removal at bedtime - could consider lymphapress compression boots if symptoms persist and if staff at facility can assist with applying them  Facility medication list was reviewed.  Return in 1 month or sooner for any questions/problems before then.

## 2018-06-17 DIAGNOSIS — E1165 Type 2 diabetes mellitus with hyperglycemia: Secondary | ICD-10-CM | POA: Diagnosis not present

## 2018-06-19 DIAGNOSIS — I10 Essential (primary) hypertension: Secondary | ICD-10-CM | POA: Diagnosis not present

## 2018-06-19 DIAGNOSIS — I509 Heart failure, unspecified: Secondary | ICD-10-CM | POA: Diagnosis not present

## 2018-06-19 DIAGNOSIS — M6281 Muscle weakness (generalized): Secondary | ICD-10-CM | POA: Diagnosis not present

## 2018-06-20 DIAGNOSIS — M6281 Muscle weakness (generalized): Secondary | ICD-10-CM | POA: Diagnosis not present

## 2018-06-20 DIAGNOSIS — I10 Essential (primary) hypertension: Secondary | ICD-10-CM | POA: Diagnosis not present

## 2018-06-20 DIAGNOSIS — I509 Heart failure, unspecified: Secondary | ICD-10-CM | POA: Diagnosis not present

## 2018-06-21 DIAGNOSIS — M6281 Muscle weakness (generalized): Secondary | ICD-10-CM | POA: Diagnosis not present

## 2018-06-21 DIAGNOSIS — I10 Essential (primary) hypertension: Secondary | ICD-10-CM | POA: Diagnosis not present

## 2018-06-21 DIAGNOSIS — I509 Heart failure, unspecified: Secondary | ICD-10-CM | POA: Diagnosis not present

## 2018-06-22 DIAGNOSIS — M6281 Muscle weakness (generalized): Secondary | ICD-10-CM | POA: Diagnosis not present

## 2018-06-22 DIAGNOSIS — I509 Heart failure, unspecified: Secondary | ICD-10-CM | POA: Diagnosis not present

## 2018-06-22 DIAGNOSIS — I10 Essential (primary) hypertension: Secondary | ICD-10-CM | POA: Diagnosis not present

## 2018-06-23 DIAGNOSIS — M6281 Muscle weakness (generalized): Secondary | ICD-10-CM | POA: Diagnosis not present

## 2018-06-23 DIAGNOSIS — I10 Essential (primary) hypertension: Secondary | ICD-10-CM | POA: Diagnosis not present

## 2018-06-23 DIAGNOSIS — I509 Heart failure, unspecified: Secondary | ICD-10-CM | POA: Diagnosis not present

## 2018-06-26 DIAGNOSIS — I10 Essential (primary) hypertension: Secondary | ICD-10-CM | POA: Diagnosis not present

## 2018-06-26 DIAGNOSIS — M6281 Muscle weakness (generalized): Secondary | ICD-10-CM | POA: Diagnosis not present

## 2018-06-26 DIAGNOSIS — I509 Heart failure, unspecified: Secondary | ICD-10-CM | POA: Diagnosis not present

## 2018-06-27 DIAGNOSIS — E782 Mixed hyperlipidemia: Secondary | ICD-10-CM | POA: Diagnosis not present

## 2018-06-27 DIAGNOSIS — N183 Chronic kidney disease, stage 3 (moderate): Secondary | ICD-10-CM | POA: Diagnosis not present

## 2018-06-27 DIAGNOSIS — I509 Heart failure, unspecified: Secondary | ICD-10-CM | POA: Diagnosis not present

## 2018-06-27 DIAGNOSIS — M6281 Muscle weakness (generalized): Secondary | ICD-10-CM | POA: Diagnosis not present

## 2018-06-27 DIAGNOSIS — I10 Essential (primary) hypertension: Secondary | ICD-10-CM | POA: Diagnosis not present

## 2018-06-27 DIAGNOSIS — I5032 Chronic diastolic (congestive) heart failure: Secondary | ICD-10-CM | POA: Diagnosis not present

## 2018-06-28 DIAGNOSIS — I10 Essential (primary) hypertension: Secondary | ICD-10-CM | POA: Diagnosis not present

## 2018-06-28 DIAGNOSIS — I509 Heart failure, unspecified: Secondary | ICD-10-CM | POA: Diagnosis not present

## 2018-06-28 DIAGNOSIS — M6281 Muscle weakness (generalized): Secondary | ICD-10-CM | POA: Diagnosis not present

## 2018-06-30 DIAGNOSIS — I509 Heart failure, unspecified: Secondary | ICD-10-CM | POA: Diagnosis not present

## 2018-06-30 DIAGNOSIS — I10 Essential (primary) hypertension: Secondary | ICD-10-CM | POA: Diagnosis not present

## 2018-06-30 DIAGNOSIS — M6281 Muscle weakness (generalized): Secondary | ICD-10-CM | POA: Diagnosis not present

## 2018-07-01 DIAGNOSIS — I10 Essential (primary) hypertension: Secondary | ICD-10-CM | POA: Diagnosis not present

## 2018-07-01 DIAGNOSIS — I509 Heart failure, unspecified: Secondary | ICD-10-CM | POA: Diagnosis not present

## 2018-07-01 DIAGNOSIS — M6281 Muscle weakness (generalized): Secondary | ICD-10-CM | POA: Diagnosis not present

## 2018-07-03 DIAGNOSIS — M6281 Muscle weakness (generalized): Secondary | ICD-10-CM | POA: Diagnosis not present

## 2018-07-03 DIAGNOSIS — I10 Essential (primary) hypertension: Secondary | ICD-10-CM | POA: Diagnosis not present

## 2018-07-03 DIAGNOSIS — I509 Heart failure, unspecified: Secondary | ICD-10-CM | POA: Diagnosis not present

## 2018-07-04 DIAGNOSIS — I509 Heart failure, unspecified: Secondary | ICD-10-CM | POA: Diagnosis not present

## 2018-07-04 DIAGNOSIS — I10 Essential (primary) hypertension: Secondary | ICD-10-CM | POA: Diagnosis not present

## 2018-07-04 DIAGNOSIS — M6281 Muscle weakness (generalized): Secondary | ICD-10-CM | POA: Diagnosis not present

## 2018-07-05 DIAGNOSIS — I509 Heart failure, unspecified: Secondary | ICD-10-CM | POA: Diagnosis not present

## 2018-07-05 DIAGNOSIS — I10 Essential (primary) hypertension: Secondary | ICD-10-CM | POA: Diagnosis not present

## 2018-07-05 DIAGNOSIS — M6281 Muscle weakness (generalized): Secondary | ICD-10-CM | POA: Diagnosis not present

## 2018-07-06 DIAGNOSIS — M6281 Muscle weakness (generalized): Secondary | ICD-10-CM | POA: Diagnosis not present

## 2018-07-06 DIAGNOSIS — I509 Heart failure, unspecified: Secondary | ICD-10-CM | POA: Diagnosis not present

## 2018-07-06 DIAGNOSIS — I10 Essential (primary) hypertension: Secondary | ICD-10-CM | POA: Diagnosis not present

## 2018-07-07 DIAGNOSIS — M6281 Muscle weakness (generalized): Secondary | ICD-10-CM | POA: Diagnosis not present

## 2018-07-07 DIAGNOSIS — I509 Heart failure, unspecified: Secondary | ICD-10-CM | POA: Diagnosis not present

## 2018-07-07 DIAGNOSIS — I10 Essential (primary) hypertension: Secondary | ICD-10-CM | POA: Diagnosis not present

## 2018-07-09 DIAGNOSIS — I509 Heart failure, unspecified: Secondary | ICD-10-CM | POA: Diagnosis not present

## 2018-07-09 DIAGNOSIS — I10 Essential (primary) hypertension: Secondary | ICD-10-CM | POA: Diagnosis not present

## 2018-07-09 DIAGNOSIS — M6281 Muscle weakness (generalized): Secondary | ICD-10-CM | POA: Diagnosis not present

## 2018-07-12 DIAGNOSIS — I509 Heart failure, unspecified: Secondary | ICD-10-CM | POA: Diagnosis not present

## 2018-07-12 DIAGNOSIS — F0390 Unspecified dementia without behavioral disturbance: Secondary | ICD-10-CM | POA: Diagnosis not present

## 2018-07-12 DIAGNOSIS — I10 Essential (primary) hypertension: Secondary | ICD-10-CM | POA: Diagnosis not present

## 2018-07-12 DIAGNOSIS — E119 Type 2 diabetes mellitus without complications: Secondary | ICD-10-CM | POA: Diagnosis not present

## 2018-07-12 DIAGNOSIS — M6281 Muscle weakness (generalized): Secondary | ICD-10-CM | POA: Diagnosis not present

## 2018-07-12 DIAGNOSIS — E785 Hyperlipidemia, unspecified: Secondary | ICD-10-CM | POA: Diagnosis not present

## 2018-07-12 DIAGNOSIS — K5900 Constipation, unspecified: Secondary | ICD-10-CM | POA: Diagnosis not present

## 2018-07-20 ENCOUNTER — Encounter: Payer: Self-pay | Admitting: Pharmacist

## 2018-07-20 ENCOUNTER — Encounter: Payer: Self-pay | Admitting: Family

## 2018-07-20 ENCOUNTER — Ambulatory Visit: Payer: Medicare HMO | Attending: Family | Admitting: Family

## 2018-07-20 VITALS — BP 100/59 | HR 76 | Resp 18 | Ht 69.0 in | Wt 250.0 lb

## 2018-07-20 DIAGNOSIS — F0390 Unspecified dementia without behavioral disturbance: Secondary | ICD-10-CM | POA: Diagnosis not present

## 2018-07-20 DIAGNOSIS — E1122 Type 2 diabetes mellitus with diabetic chronic kidney disease: Secondary | ICD-10-CM | POA: Diagnosis not present

## 2018-07-20 DIAGNOSIS — I890 Lymphedema, not elsewhere classified: Secondary | ICD-10-CM | POA: Diagnosis not present

## 2018-07-20 DIAGNOSIS — R0602 Shortness of breath: Secondary | ICD-10-CM | POA: Diagnosis present

## 2018-07-20 DIAGNOSIS — E785 Hyperlipidemia, unspecified: Secondary | ICD-10-CM | POA: Diagnosis not present

## 2018-07-20 DIAGNOSIS — I10 Essential (primary) hypertension: Secondary | ICD-10-CM

## 2018-07-20 DIAGNOSIS — I130 Hypertensive heart and chronic kidney disease with heart failure and stage 1 through stage 4 chronic kidney disease, or unspecified chronic kidney disease: Secondary | ICD-10-CM | POA: Diagnosis not present

## 2018-07-20 DIAGNOSIS — N183 Chronic kidney disease, stage 3 unspecified: Secondary | ICD-10-CM

## 2018-07-20 DIAGNOSIS — Z794 Long term (current) use of insulin: Secondary | ICD-10-CM | POA: Diagnosis not present

## 2018-07-20 DIAGNOSIS — R05 Cough: Secondary | ICD-10-CM | POA: Diagnosis present

## 2018-07-20 DIAGNOSIS — I5032 Chronic diastolic (congestive) heart failure: Secondary | ICD-10-CM

## 2018-07-20 DIAGNOSIS — Z79899 Other long term (current) drug therapy: Secondary | ICD-10-CM | POA: Diagnosis not present

## 2018-07-20 DIAGNOSIS — N189 Chronic kidney disease, unspecified: Secondary | ICD-10-CM | POA: Diagnosis not present

## 2018-07-20 DIAGNOSIS — G479 Sleep disorder, unspecified: Secondary | ICD-10-CM | POA: Diagnosis present

## 2018-07-20 DIAGNOSIS — Z8249 Family history of ischemic heart disease and other diseases of the circulatory system: Secondary | ICD-10-CM | POA: Diagnosis not present

## 2018-07-20 NOTE — Patient Instructions (Signed)
Continue weighing daily and call for an overnight weight gain of > 2 pounds or a weekly weight gain of >5 pounds. 

## 2018-07-20 NOTE — Progress Notes (Signed)
Meds reviewed with [facility]. Metoprolol discontinued by Dr. Nehemiah Massed on 06/27/18. Patient was not seen by pharmacist but meds were reconciled with facility list and nurse at [facility].

## 2018-07-20 NOTE — Progress Notes (Signed)
Patient ID: Daniel [name], male    DOB: Oct 13, 1927, 84 y.o.   MRN: [id number]  HPI  Daniel [name] is a 84 y/o male with a history of DM, hyperlipidemia, HTN, CKD, arthritis, dementia and chronic heart failure.   Echo report from 06/06/18 reviewed and showed an EF of 55-65%.  Admitted 06/05/18 due to acute on chronic HF. Initially needed IV lasix along with bipap. Transitioned to oral diuretics and weaned off bipap. Palliative care and cardiology consults were obtained. Discharged to SNF after 4 days.   He presents today for a follow-up visit with a chief complaint of moderate shortness of breath upon minimal exertion. He describes this as chronic in nature having been present for several years. He has associated fatigue, cough, pedal edema and difficulty sleeping along with this. He denies any dizziness, abdominal distention, palpitations, chest pain or weight gain. Says that he doesn't think that he's being weighed daily at the facility. Is now wearing support socks with some improvement in edema.   Past Medical History:  Diagnosis Date  . Arthritis   . CHF (congestive heart failure) ([place])   . Chronic kidney disease   . Dementia ([place])   . Diabetes mellitus without complication (HCC)    Type 2  . Hyperlipidemia   . Hypertension   . Low back pain   . Muscle weakness (generalized)   . Other fracture of t5-T6 vertebra, subsequent encounter for fracture with routine healing   . Polyosteoarthritis   . Renal disorder   . Type 2 diabetes mellitus ([place])   . Unspecified fracture of t5-T6 vertebra, subsequent encounter for fracture with routine healing    Past Surgical History:  Procedure Laterality Date  . BACK SURGERY    . CHOLECYSTECTOMY    . HERNIA REPAIR    . KNEE SURGERY    . teeth pulled  Bilateral 08/13/14   teeth pulled and bottom gum removed   Family History  Problem Relation Age of Onset  . Hypertension Father    Social History   Tobacco Use  . Smoking status: Never Smoker  .  Smokeless tobacco: Never Used  Substance Use Topics  . Alcohol use: No   Allergies  Allergen Reactions  . Oxycodone Nausea And Vomiting  . Codeine    Prior to Admission medications   Medication Sig Start Date End Date Taking? Authorizing Provider  amLODipine (NORVASC) 10 MG tablet Take 10 mg by mouth daily.   Yes [provider]  atorvastatin (LIPITOR) 10 MG tablet Take 10 mg by mouth daily.    Yes [provider]  bumetanide (BUMEX) 0.5 MG tablet Take 0.5 mg by mouth daily.   Yes [provider]  escitalopram (LEXAPRO) 5 MG tablet Take 5 mg by mouth daily.   Yes [provider]  fluticasone (FLONASE) 50 MCG/ACT nasal spray Place 2 sprays into both nostrils daily. Patient taking differently: Place 2 sprays into both nostrils at bedtime.  09/30/17  Yes Yu, Amy V, PA-C  glimepiride (AMARYL) 4 MG tablet Take 4 mg by mouth daily.   Yes [provider]  insulin aspart (NOVOLOG) 100 UNIT/ML injection Inject 3-11 Units into the skin 4 (four) times daily -  before meals and at bedtime. Per sliding scale: 3 units = if blood sugar is 201-250 5 units = if blood sugar is 251-300 7 units = if blood sugar is 301-350 9 units = if blood sugar is 351-400 11 units = if blood sugar is greater than  400   Yes [provider]  insulin detemir (LEVEMIR) 100 UNIT/ML injection Inject 63 Units into the skin daily.   Yes [provider]  ipratropium (ATROVENT) 0.06 % nasal spray Place 2 sprays into both nostrils 4 (four) times daily. Patient taking differently: Place 2 sprays into both nostrils 3 (three) times daily.  09/30/17  Yes Yu, Amy V, PA-C  ipratropium-albuterol (DUONEB) 0.5-2.5 (3) MG/3ML SOLN Take 3 mLs by nebulization every 6 (six) hours as needed. 06/08/18  Yes Sainani, Belia Heman, MD  linagliptin (TRADJENTA) 5 MG TABS tablet Take 5 mg by mouth daily.   Yes [provider]  losartan (COZAAR) 100 MG tablet Take 100 mg by mouth daily.   Yes  [provider]  memantine (NAMENDA XR) 28 MG CP24 24 hr capsule Take 28 mg by mouth daily.   Yes [provider]  polyethylene glycol (MIRALAX / GLYCOLAX) packet Take 17 g by mouth daily.   Yes [provider]  senna-docusate (SENOKOT-S) 8.6-50 MG tablet Take 2 tablets by mouth 2 (two) times daily.   Yes [provider]  tizanidine (ZANAFLEX) 2 MG capsule Take 2 mg by mouth 2 (two) times daily.   Yes [provider]  metoprolol succinate (TOPROL-XL) 25 MG 24 hr tablet Take 37.5 mg by mouth 2 (two) times daily.    [provider]    Review of Systems  Constitutional: Positive for fatigue. Negative for appetite change.  HENT: Positive for rhinorrhea. Negative for congestion and sore throat.   Eyes: Negative.   Respiratory: Positive for cough (improving) and shortness of breath (with minimal exertion). Negative for chest tightness.   Cardiovascular: Positive for leg swelling. Negative for chest pain and palpitations.  Gastrointestinal: Negative for abdominal distention and abdominal pain.  Endocrine: Negative.   Genitourinary: Negative.   Musculoskeletal: Positive for back pain. Negative for neck pain.  Skin: Negative.   Allergic/Immunologic: Negative.   Neurological: Negative for dizziness and light-headedness.  Hematological: Negative for adenopathy. Does not bruise/bleed easily.  Psychiatric/Behavioral: Positive for confusion (at times) and sleep disturbance (sleeping on 2 pillows with oxygen).   Vitals:   07/20/18 0956  BP: (!) 100/59  Pulse: 76  Resp: 18  SpO2: 100%  Weight: 250 lb (113.4 kg)  Height: 5\' 9"  (1.753 m)   Wt Readings from Last 3 Encounters:  07/20/18 250 lb (113.4 kg)  06/16/18 250 lb (113.4 kg)  06/09/18 264 lb 8.8 oz (120 kg)   Lab Results  Component Value Date   CREATININE 1.36 (H) 06/09/2018   CREATININE 1.48 (H) 06/08/2018   CREATININE 1.27 (H) 06/07/2018    Physical Exam Vitals signs and nursing  note reviewed.  Constitutional:      Appearance: He is well-developed.  HENT:     Head: Normocephalic and atraumatic.  Neck:     Musculoskeletal: Normal range of motion and neck supple.     Vascular: No JVD.  Cardiovascular:     Rate and Rhythm: Normal rate and regular rhythm.  Pulmonary:     Effort: Pulmonary effort is normal. No respiratory distress.     Breath sounds: No wheezing or rales.  Abdominal:     General: There is no distension.     Palpations: Abdomen is soft.  Musculoskeletal:        General: No tenderness.     Right lower leg: He exhibits no tenderness. Edema (1+pitting) present.     Left lower leg: He exhibits no tenderness. Edema (1+ pitting)  present.  Skin:    General: Skin is warm and dry.  Neurological:     Mental Status: He is alert and oriented to person, place, and time.  Psychiatric:        Behavior: Behavior normal.    Assessment & Plan:  1: Chronic heart failure with preserved ejection fraction- - NYHA class III - euvolemic with pedal edema - order written, again,  for him to be weighed daily and for facility to call for an overnight weight gain of >2 pounds or a weekly weight gain of >5 pounds - stated weight unchanged from last visit here 1 month ago - not adding salt and caregiver from facility doesn't think the food is cooked with salt - saw cardiology Nehemiah Massed) 06/27/18 - says that he's no longer receiving PT - BNP 06/05/18 was 43.0 - caregiver thinks that patient has received his flu vaccine - wearing oxygen at 2L during the day and then what sounds like CPAP at bedtime  2: HTN- - BP looks good although on the low side - sees PCP at [facility] - BMP from 06/09/18 reviewed and showed sodium 140, potassium 4.6, creatinine 1.36 and GFR 52  3: DM- - glucose being checked at the facility - A1c 07/31/12 was 8.0%  4: Lymphedema- - stage 2 - sits in his wheelchair or in the bed; has legs elevated in the bed - limited in his ability to  exercise due to deconditioning - wearing compression socks daily with removal at bedtime - could consider lymphapress compression boots if symptoms persist and if staff at facility can assist with applying them  Facility medication list was reviewed.  Return in 4 months or sooner for any questions/problems before then.

## 2018-07-23 ENCOUNTER — Encounter: Payer: Self-pay | Admitting: Family

## 2018-07-28 ENCOUNTER — Non-Acute Institutional Stay: Payer: Medicare HMO | Admitting: Student

## 2018-07-28 VITALS — BP 108/60 | HR 76 | Resp 20

## 2018-07-28 DIAGNOSIS — Z515 Encounter for palliative care: Secondary | ICD-10-CM | POA: Diagnosis not present

## 2018-07-28 NOTE — Progress Notes (Signed)
[facility] Telephone: [phone] Fax: [phone]  PATIENT NAME: Daniel [name] DOB: 1927-08-30 MRN: [id number]  PRIMARY CARE PROVIDER:   Dr. Orbie Pyo PROVIDER:  Dr. Lovie Macadamia  RESPONSIBLE PARTY:daughter Daniel [name], [phone]      ASSESSMENT: Daniel [name] is an 84 year old patient with multiple medical problems including heart failure, COPD, essential hypertension, type 2 diabetes, hyperlipidemia, dementia, obstructive sleep apnea, wheezing, acute respiratory infection, seasonal allergies allergic rhinitis, pain in right shoulder, constipation, muscle spasms. Explained role of Palliative Medicine. Daniel [name] is alert and oriented x 2; pleasant affect. No acute distress. We discussed goals of care. We discussed symptom management. Daniel [name] recalls when he initially fell, then having back surgery and not ambulating in around two years. 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     RECOMMENDATIONS and PLAN:  1. CODE STATUS: Full Code. Daniel [name] would like CPR attempted. MOST form on chart. 2. Medical goals of therapy: At this time, goals of therapy are focused on comfort and symptom management. Staff is to monitor for changes or declines.  3. Symptom management: Currently no unmanaged symptoms. 4. Discharge Planning: Patient will continue to reside at [facility]. 5. Emotional/spiritual support: Discussed with patient and staff nurse Soumy; staff is encouraged to call as needs arise.  Palliative Care to continue to follow for emotional/spiritual support, ongoing discussions trajectory of chronic disease progression, medical goals of therapy, monitor for symptoms with management, and reduce ED and hospitalizations with recommendations.  Will follow up with daughter to provide an update. Palliative Medicine to follow up in 8 weeks or sooner, if needed.   I spent 15 minutes providing this consultation,   from 10:40am to 10:55am. More than 50% of the time in this consultation was spent coordinating communication.   HISTORY OF PRESENT ILLNESS:  Daniel [name] is a 84 y.o. year old male with multiple medical problems including heart failure, COPD, essential hypertension, type 2 diabetes, hyperlipidemia, dementia, obstructive sleep apnea, wheezing, acute respiratory infection, seasonal allergies allergic rhinitis, pain in right shoulder, constipation, muscle spasms. Palliative Care was asked to help address ongoing goals of care. Daniel [name] resides at [facility]. He requires assistance with activities of daily living. He is non-ambulatory; he is up to wheel chair. He reports occasional pain to his right shoulder. He denies back pain. He reports shortness of breath at times; he is wearing oxygen via nasal canula at 2 liters per minute. Staff report his dyspnea has improved since oxygen added, although he sometimes removes it. He wears C-pap at night. He reports a good appetite. He states he is sleeping okay at night. No other concerns or declines reported per staff. No recent hospitalizations or emergency room visits.   CODE STATUS: Full Code    PPS: 40% HOSPICE ELIGIBILITY/DIAGNOSIS: TBD  PAST MEDICAL HISTORY:  Past Medical History:  Diagnosis Date  . Arthritis   . CHF (congestive heart failure) ([place])   . Chronic kidney disease   . Dementia ([place])   . Diabetes mellitus without complication (HCC)    Type 2  . Hyperlipidemia   . Hypertension   . Low back pain   . Muscle weakness (generalized)   . Other fracture of t5-T6 vertebra, subsequent encounter for fracture with routine healing   . Polyosteoarthritis   . Renal disorder   . Type 2 diabetes mellitus ([place])   . Unspecified fracture of t5-T6 vertebra, subsequent encounter  for fracture with routine healing     SOCIAL HX:  Social History   Tobacco Use  . Smoking status: Never Smoker  . Smokeless tobacco: Never Used  Substance Use Topics   . Alcohol use: No    ALLERGIES:  Allergies  Allergen Reactions  . Oxycodone Nausea And Vomiting  . Codeine      PERTINENT MEDICATIONS:  Outpatient Encounter Medications as of 07/28/2018  Medication Sig  . amLODipine (NORVASC) 10 MG tablet Take 10 mg by mouth daily. Take 0.5 tablet (5mg ) daily.  [name] atorvastatin (LIPITOR) 10 MG tablet Take 10 mg by mouth daily.   . bumetanide (BUMEX) 0.5 MG tablet Take 0.5 mg by mouth daily.  [name] escitalopram (LEXAPRO) 5 MG tablet Take 5 mg by mouth daily.  . fluticasone (FLONASE) 50 MCG/ACT nasal spray Place 2 sprays into both nostrils daily. (Patient taking differently: Place 2 sprays into both nostrils at bedtime. )  . glimepiride (AMARYL) 4 MG tablet Take 4 mg by mouth daily.  . insulin aspart (NOVOLOG) 100 UNIT/ML injection Inject 3-11 Units into the skin 4 (four) times daily -  before meals and at bedtime. Per sliding scale: 3 units = if blood sugar is 201-250 5 units = if blood sugar is 251-300 7 units = if blood sugar is 301-350 9 units = if blood sugar is 351-400 11 units = if blood sugar is greater than 400  . insulin detemir (LEVEMIR) 100 UNIT/ML injection Inject 63 Units into the skin daily.  [name] ipratropium (ATROVENT) 0.06 % nasal spray Place 2 sprays into both nostrils 4 (four) times daily. (Patient taking differently: Place 2 sprays into both nostrils 3 (three) times daily. )  . ipratropium-albuterol (DUONEB) 0.5-2.5 (3) MG/3ML SOLN Take 3 mLs by nebulization every 6 (six) hours as needed.  . linagliptin (TRADJENTA) 5 MG TABS tablet Take 5 mg by mouth daily.  [name] losartan (COZAAR) 100 MG tablet Take 100 mg by mouth daily.  . memantine (NAMENDA XR) 28 MG CP24 24 hr capsule Take 28 mg by mouth daily.  . polyethylene glycol (MIRALAX / GLYCOLAX) packet Take 17 g by mouth daily.  [name] senna-docusate (SENOKOT-S) 8.6-50 MG tablet Take 2 tablets by mouth 2 (two) times daily.  . tizanidine (ZANAFLEX) 2 MG capsule Take 2 mg by mouth 2 (two) times daily.   . metoprolol succinate (TOPROL-XL) 25 MG 24 hr tablet Take 37.5 mg by mouth 2 (two) times daily.   No facility-administered encounter medications on file as of 07/28/2018.     PHYSICAL EXAM:   General: NAD Cardiovascular: regular rate and rhythm Pulmonary: clear ant fields Abdomen: soft, nontender, + bowel sounds GU: no suprapubic tenderness Extremities: 2+ non-pitting edema to bilateral feet Skin: no rashes Neurological: Weakness but otherwise nonfocal  Ezekiel Slocumb, NP

## 2018-08-07 DIAGNOSIS — R0689 Other abnormalities of breathing: Secondary | ICD-10-CM | POA: Diagnosis not present

## 2018-08-07 DIAGNOSIS — R404 Transient alteration of awareness: Secondary | ICD-10-CM | POA: Diagnosis not present

## 2018-08-07 DIAGNOSIS — I499 Cardiac arrhythmia, unspecified: Secondary | ICD-10-CM | POA: Diagnosis not present

## 2018-09-02 DIAGNOSIS — 419620001 Death: Secondary | SNOMED CT | POA: Diagnosis not present

## 2018-09-02 DEATH — deceased

## 2018-11-20 ENCOUNTER — Ambulatory Visit: Payer: Medicare HMO | Admitting: Family

## 2018-12-13 ENCOUNTER — Ambulatory Visit: Payer: Medicare HMO | Admitting: Nurse Practitioner

## 2018-12-13 ENCOUNTER — Ambulatory Visit: Payer: Medicare HMO

## 2019-09-22 IMAGING — CR DG CHEST 2V
2 series · 2 of 2 positions shown · non-contrast
Comparison: 05/10/2016

CLINICAL DATA: Increase shortness-of-breath 1 week. Oxygen
dependent.

EXAM:
CHEST - 2 VIEW

[chest lat]
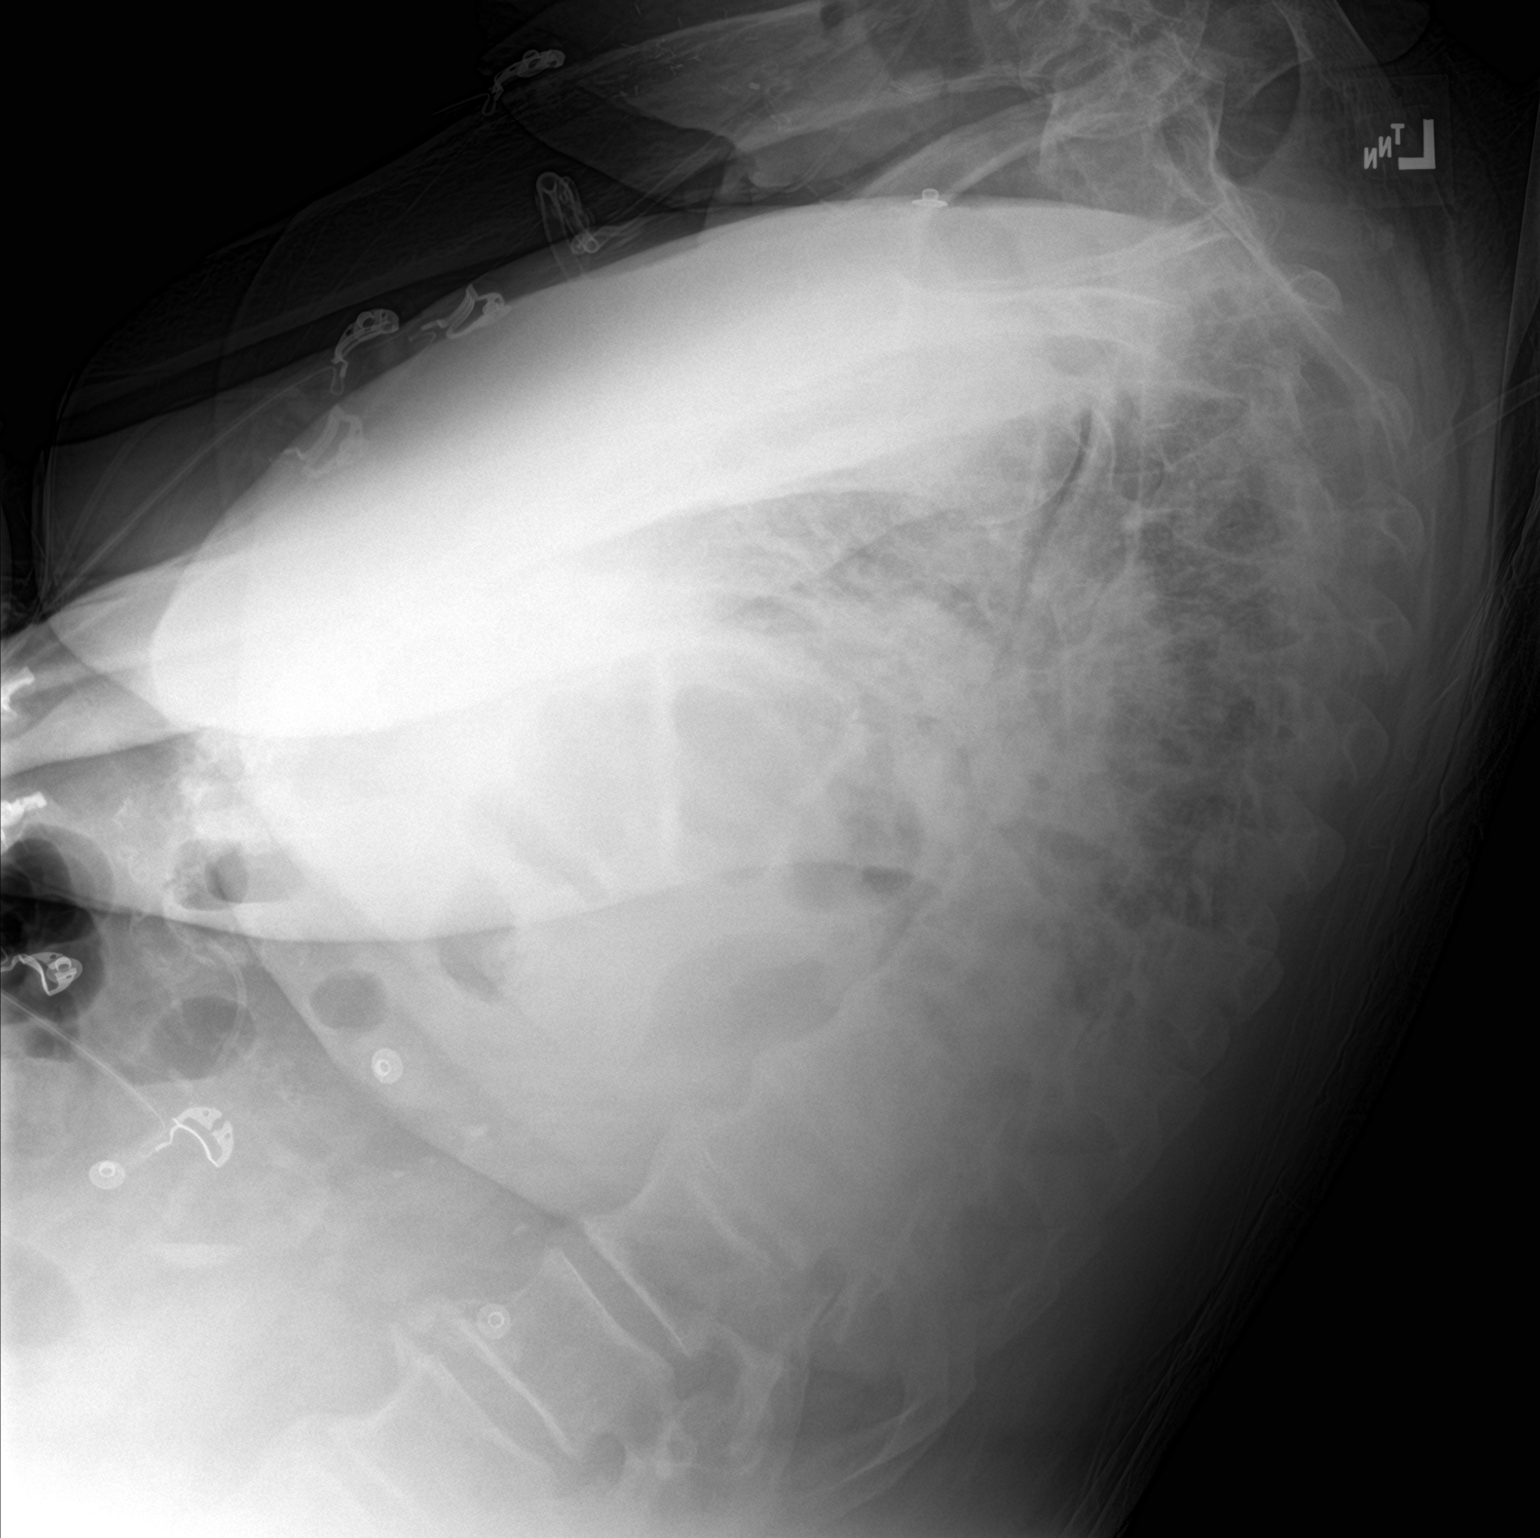

[chest ap]
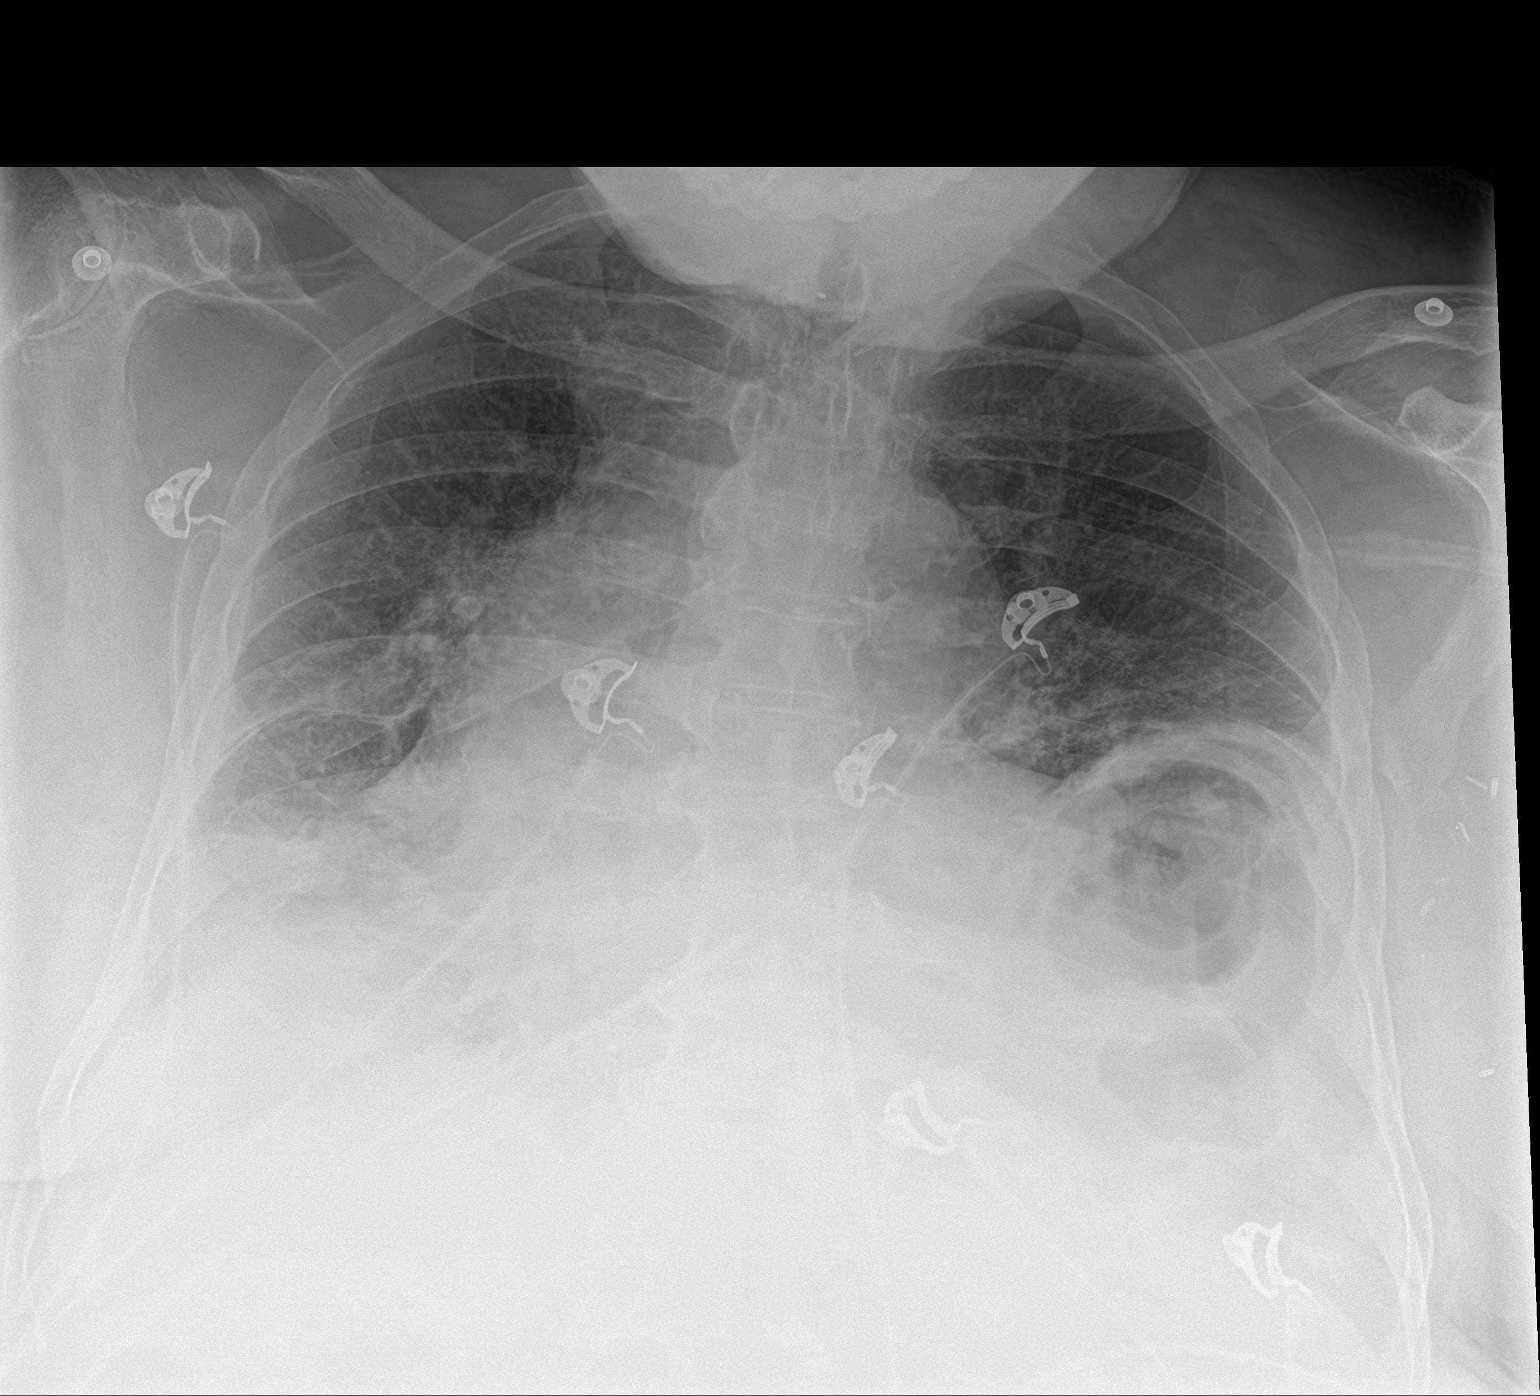

[2 of 2 positions shown; findings below may reference images not displayed]

FINDINGS: Lungs are hypoinflated with mild stable elevation of the left
hemidiaphragm. Mild bibasilar opacification is present which may be
due to effusions with atelectasis versus infection. Stable
cardiomegaly. Remainder of the exam is unchanged.
IMPRESSION: Hypoinflation with bibasilar opacification likely effusions with
atelectasis, although infection is possible within the lung bases.

Stable cardiomegaly.

## 2019-09-22 IMAGING — CT CT HEAD W/O CM
4 series · 17 of 47 positions shown, 19 images · non-contrast
Comparison: 01/29/2012 CT

CLINICAL DATA: 89-year-old male with RIGHT arm weakness and recent
falls.

EXAM:
CT HEAD WITHOUT CONTRAST
TECHNIQUE: Contiguous axial images were obtained from the base of the skull
through the vertex without intravenous contrast.

[Series 2: head bone · axial · 0.44mm/px · z∈[-216,-166]mm · 4 of 74 slices shown]
[im 8/74  bone]
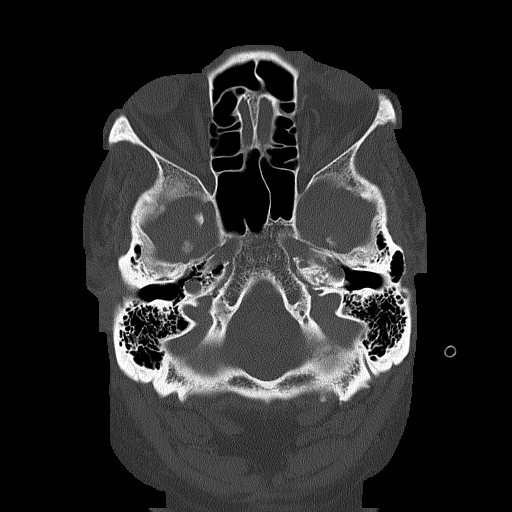
[im 15/74  bone]
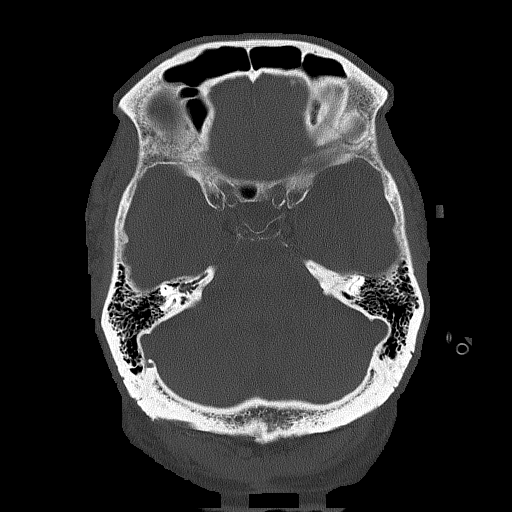
[im 22/74  bone]
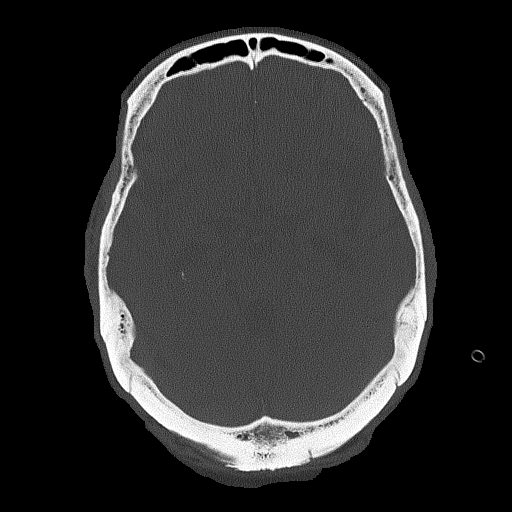
[im 33/74  bone]
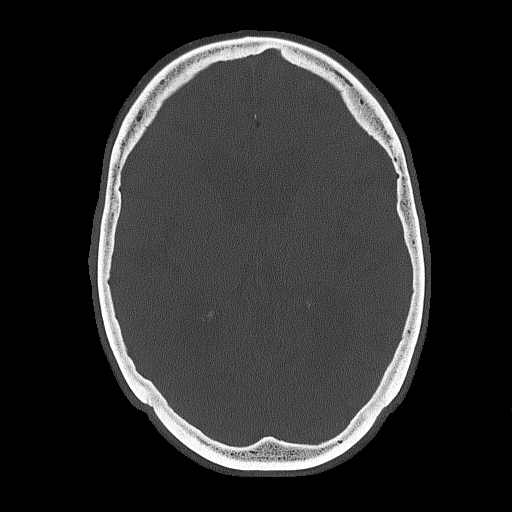

[Series 3: head wo · axial · 0.44mm/px · z∈[-215,-105]mm · 7 of 30 slices shown, 9 images]
[im 4/30  brain]
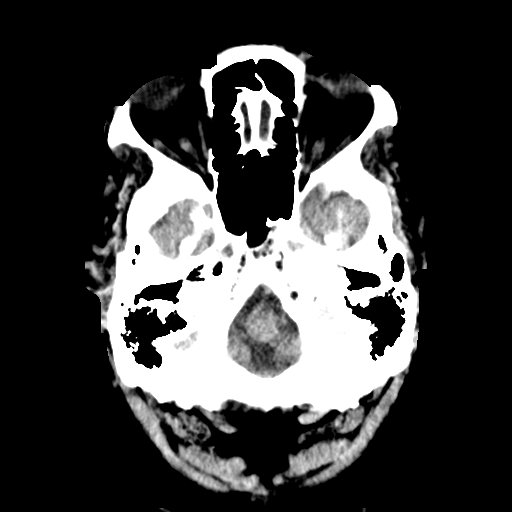
[im 4/30  bone]
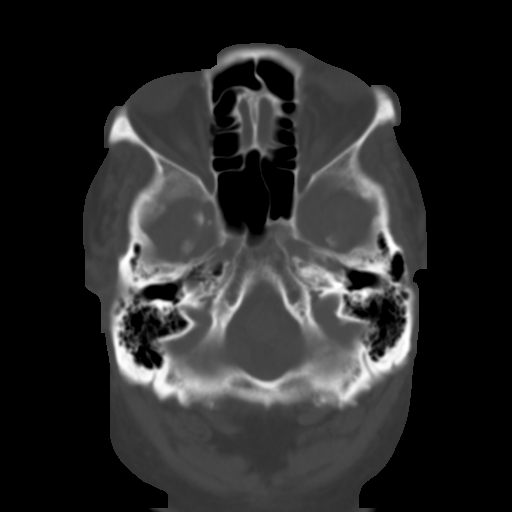
[im 8/30  brain]
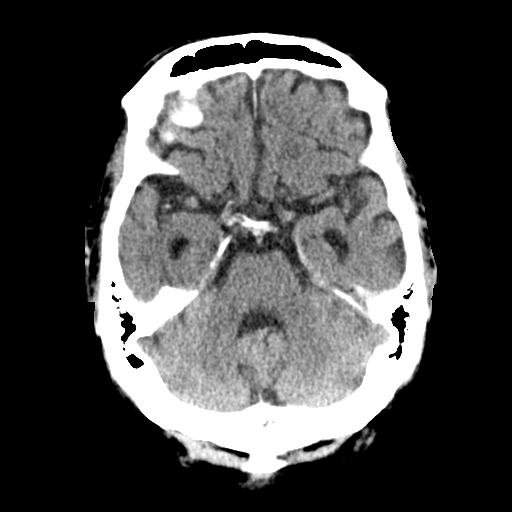
[im 11/30  brain]
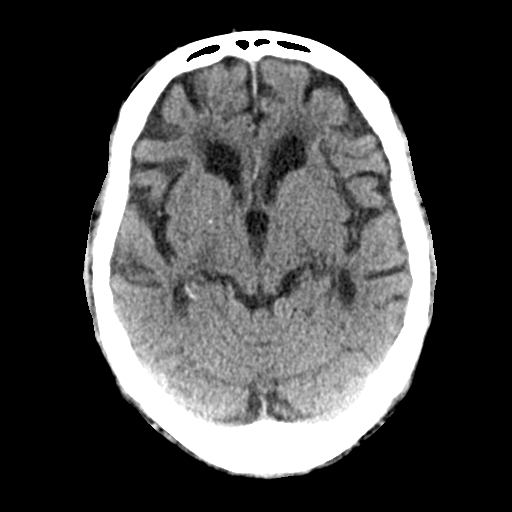
[im 15/30  brain]
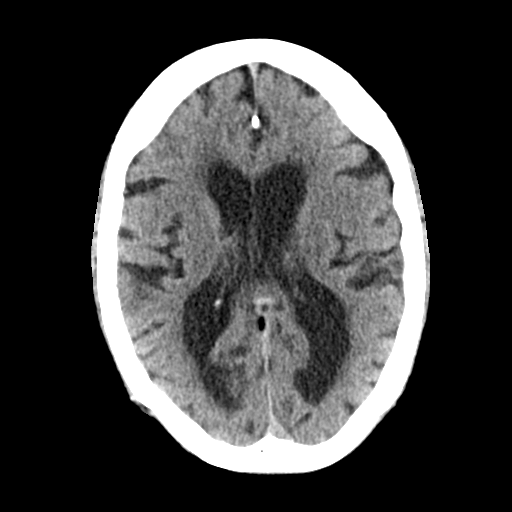
[im 19/30  brain]
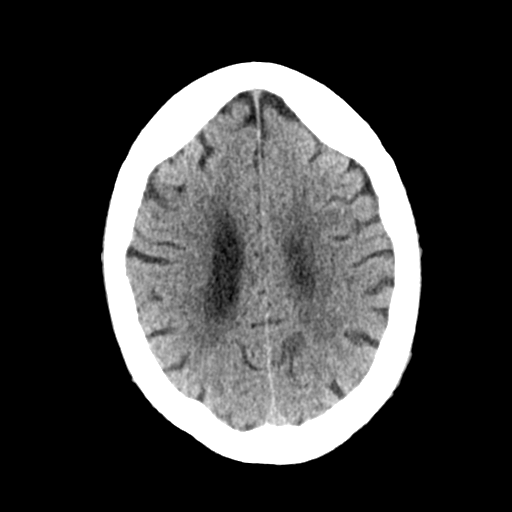
[im 19/30  bone]
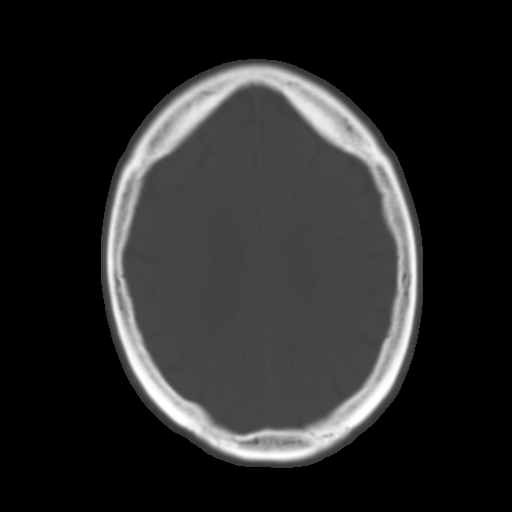
[im 22/30  brain]
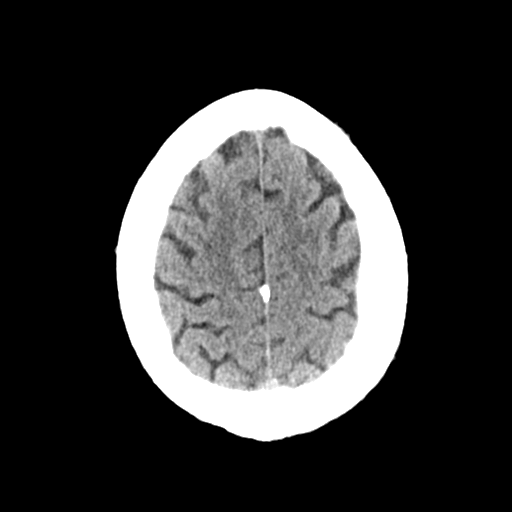
[im 26/30  brain]
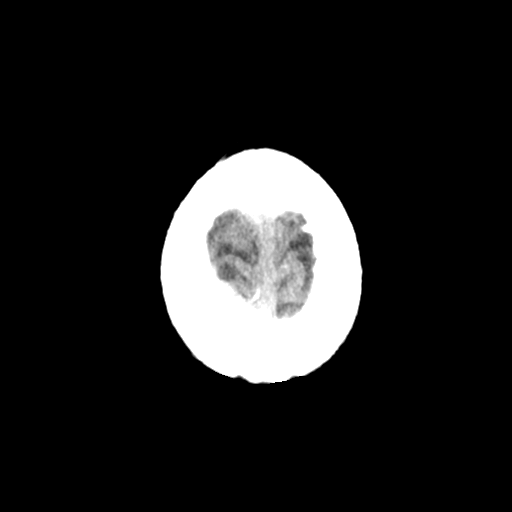

[Series 4: coronal soft tissue · coronal · 0.33mm/px · 3 of 70 slices shown]
[im 24/70  brain]
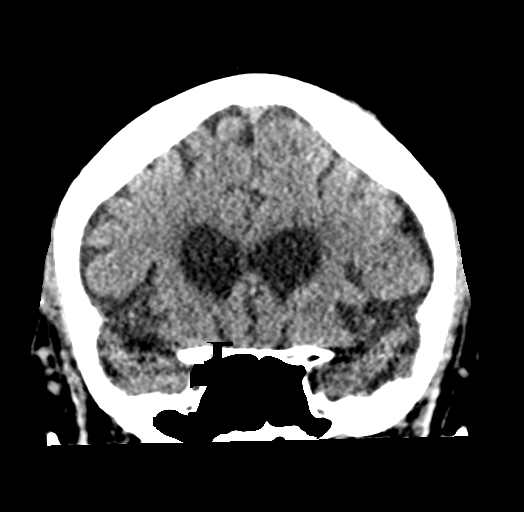
[im 31/70  brain]
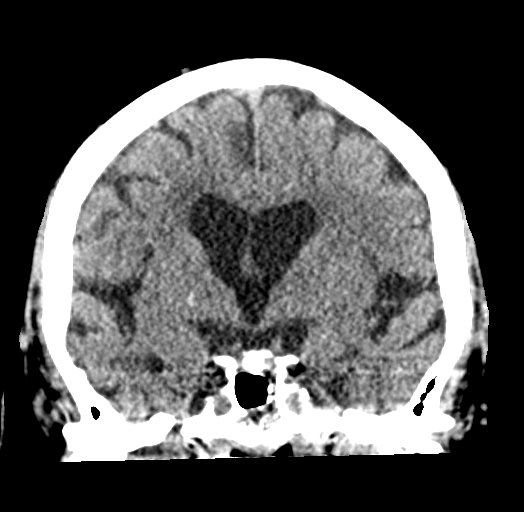
[im 39/70  brain]
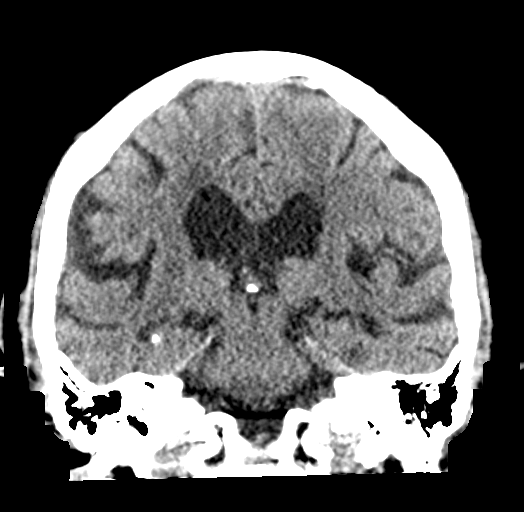

[Series 5: sagittal soft tissue · sagittal · 0.33mm/px · 3 of 58 slices shown]
[im 20/58  brain]
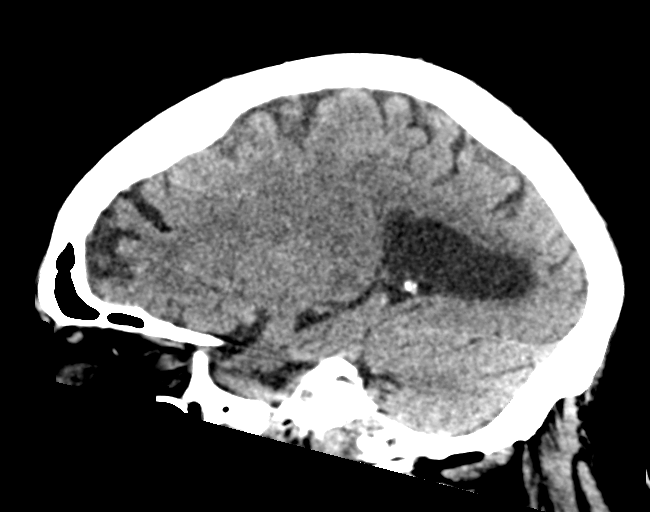
[im 29/58  brain]
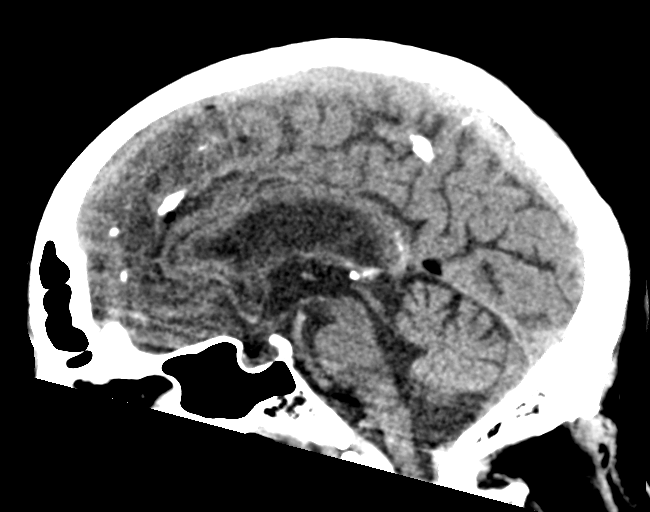
[im 39/58  brain]
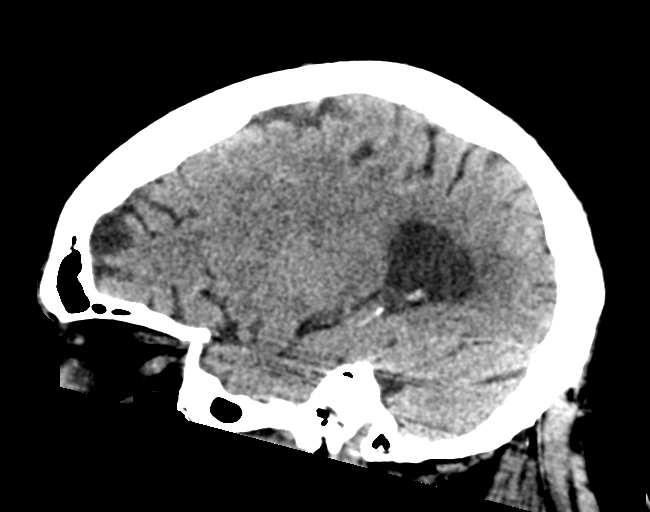

[17 of 47 positions shown; findings below may reference images not displayed]

FINDINGS: Brain: No evidence of acute infarction, hemorrhage, hydrocephalus,
extra-axial collection or mass lesion/mass effect.

Atrophy and chronic small-vessel white matter ischemic changes again
noted.

Vascular: Intracranial atherosclerotic calcifications again noted.

Skull: Normal. Negative for fracture or focal lesion.

Sinuses/Orbits: No acute finding.

Other: None.
IMPRESSION: 1. No evidence of acute intracranial abnormality
2. Atrophy and chronic small-vessel white matter ischemic changes.
